# Patient Record
Sex: Female | Born: 1975 | Race: Black or African American | Hispanic: Yes | Marital: Married | Smoking: Current every day smoker
Health system: Southern US, Community
[De-identification: ages and names within clinical notes are randomized; demographics above are authoritative.]

## PROBLEM LIST (undated history)

## (undated) DIAGNOSIS — Z973 Presence of spectacles and contact lenses: Secondary | ICD-10-CM

## (undated) DIAGNOSIS — M1990 Unspecified osteoarthritis, unspecified site: Secondary | ICD-10-CM

## (undated) DIAGNOSIS — F4310 Post-traumatic stress disorder, unspecified: Secondary | ICD-10-CM

## (undated) DIAGNOSIS — F419 Anxiety disorder, unspecified: Secondary | ICD-10-CM

## (undated) DIAGNOSIS — E669 Obesity, unspecified: Secondary | ICD-10-CM

## (undated) DIAGNOSIS — J45909 Unspecified asthma, uncomplicated: Secondary | ICD-10-CM

## (undated) DIAGNOSIS — F32A Depression, unspecified: Secondary | ICD-10-CM

## (undated) DIAGNOSIS — K219 Gastro-esophageal reflux disease without esophagitis: Secondary | ICD-10-CM

## (undated) DIAGNOSIS — R0600 Dyspnea, unspecified: Secondary | ICD-10-CM

## (undated) DIAGNOSIS — F329 Major depressive disorder, single episode, unspecified: Secondary | ICD-10-CM

## (undated) DIAGNOSIS — I10 Essential (primary) hypertension: Secondary | ICD-10-CM

## (undated) HISTORY — DX: Unspecified osteoarthritis, unspecified site: M19.90

## (undated) HISTORY — PX: LEEP: SHX91

---

## 2013-05-03 DIAGNOSIS — F329 Major depressive disorder, single episode, unspecified: Secondary | ICD-10-CM | POA: Insufficient documentation

## 2013-05-03 DIAGNOSIS — E669 Obesity, unspecified: Secondary | ICD-10-CM | POA: Insufficient documentation

## 2013-05-03 DIAGNOSIS — F419 Anxiety disorder, unspecified: Secondary | ICD-10-CM | POA: Insufficient documentation

## 2013-05-03 DIAGNOSIS — F32A Depression, unspecified: Secondary | ICD-10-CM | POA: Insufficient documentation

## 2013-05-03 DIAGNOSIS — I10 Essential (primary) hypertension: Secondary | ICD-10-CM | POA: Insufficient documentation

## 2018-01-09 ENCOUNTER — Ambulatory Visit (HOSPITAL_COMMUNITY)
Admission: RE | Admit: 2018-01-09 | Discharge: 2018-01-09 | Disposition: A | Discharge status: Home / Self Care | Payer: Medicaid Other | Attending: Psychiatry | Admitting: Psychiatry

## 2018-01-09 ENCOUNTER — Ambulatory Visit (HOSPITAL_COMMUNITY): Admission: RE | Admit: 2018-01-09 | Payer: Medicaid Other | Source: Home / Self Care | Admitting: Psychiatry

## 2018-01-09 DIAGNOSIS — F411 Generalized anxiety disorder: Secondary | ICD-10-CM | POA: Diagnosis not present

## 2018-01-09 DIAGNOSIS — Z79899 Other long term (current) drug therapy: Secondary | ICD-10-CM | POA: Diagnosis not present

## 2018-01-09 DIAGNOSIS — F332 Major depressive disorder, recurrent severe without psychotic features: Secondary | ICD-10-CM | POA: Diagnosis not present

## 2018-01-09 DIAGNOSIS — F4310 Post-traumatic stress disorder, unspecified: Secondary | ICD-10-CM | POA: Diagnosis not present

## 2018-01-09 DIAGNOSIS — F1020 Alcohol dependence, uncomplicated: Secondary | ICD-10-CM | POA: Diagnosis not present

## 2018-01-09 DIAGNOSIS — F1220 Cannabis dependence, uncomplicated: Secondary | ICD-10-CM | POA: Diagnosis not present

## 2018-01-09 NOTE — H&P (Signed)
Behavioral Health Medical Screening Exam  Tracy Coleman is an 42 y.o. female, presents with her husband as a walk-in to the [facility] facility for psych evaluation. She endorses acute physical periods of flushing and nausea when she is upset, but denies CP, SOB, Vertigo or pain. She is endorsing exacerbated depressive symptoms, since being off of her previously prescribed Citalopram since December. She has a hx of MDD, GAD and PTSD. She is also prescribed and currently taking Wellbutrin XL 300 mg daily, Prazosin 1 mg qhs, and Trazodone 200 mg daily. She denies feelings of self harm, SI/SA or HI. Identified triggers include family discord, daughter transitioning out of the home. Depressive symptoms include sadness, crying, feeling overwhelmed and overburdened. She also added that she occassionally consumed increased quantities of alcohol but denies illicit drug use. Total Time spent with patient: 30 minutes  Psychiatric Specialty Exam: Physical Exam  Constitutional: She is oriented to person, place, and time. She appears well-developed and well-nourished. No distress.  HENT:  Head: Normocephalic.  Eyes: Pupils are equal, round, and reactive to light.  Respiratory: Effort normal and breath sounds normal. No respiratory distress.  GI: Soft.  Neurological: She is alert and oriented to person, place, and time. No cranial nerve deficit.  Skin: Skin is warm and dry. She is not diaphoretic.  Psychiatric: Her speech is normal. Judgment and thought content normal. She is withdrawn. Cognition and memory are normal. She exhibits a depressed mood.    Review of Systems  Psychiatric/Behavioral: Positive for depression and substance abuse. Negative for hallucinations and suicidal ideas. The patient is not nervous/anxious and does not have insomnia.   All other systems reviewed and are negative.   There were no vitals taken for this visit.There is no height or weight on file to calculate BMI.  General Appearance:  Casual  Eye Contact:  Good  Speech:  Clear and Coherent  Volume:  Normal  Mood:  Depressed  Affect:  Congruent  Thought Process:  Goal Directed  Orientation:  Full (Time, Place, and Person)  Thought Content:  Logical  Suicidal Thoughts:  No  Homicidal Thoughts:  No  Memory:  Immediate;   Good  Judgement:  Good  Insight:  Fair  Psychomotor Activity:  Normal  Concentration: Concentration: Good  Recall:  Good  Fund of Knowledge:Good  Language: Good  Akathisia:  Negative  Handed:  Right  AIMS (if indicated):     Assets:  Desire for Improvement  Sleep:       Musculoskeletal: Strength & Muscle Tone: within normal limits Gait & Station: normal Patient leans: N/A  There were no vitals taken for this visit.  Recommendations:  Based on my evaluation the patient does not appear to have an emergency medical condition.  Laverle Hobby, PA-C 01/09/2018, 9:50 PM
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
## 2018-03-06 ENCOUNTER — Ambulatory Visit (INDEPENDENT_AMBULATORY_CARE_PROVIDER_SITE_OTHER): Payer: Medicaid Other | Admitting: Family Medicine

## 2018-03-06 VITALS — BP 117/82 | Ht 65.0 in | Wt 253.0 lb

## 2018-03-06 DIAGNOSIS — M19079 Primary osteoarthritis, unspecified ankle and foot: Secondary | ICD-10-CM | POA: Insufficient documentation

## 2018-03-06 NOTE — Patient Instructions (Signed)
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 We will refer you to Dr. Doran Durand to discuss surgery to remove this large spur. We discussed inserts with a first ray post to unload the area. Consider bunion pad/shield to take pressure off the area. Injection is a consideration but with the amount of arthritis seen, I don't think this would be very helpful and there isn't much space for the needle to rest in the joint for the injection.

## 2018-03-06 NOTE — Progress Notes (Signed)
HPI  Tracy Coleman is a 42 y/o woman here for evaluation of pain at her right 1st MTP joint also with pain in the anterolateral distal leg. She has been noticing hypertrophy and pain over the 1st MTP for about 6 years but this has gotten worse recently, especially after wearing tight fitting heels rather than sneakers. She usually wears only sneakers because any rigid toebox tends to rub and hurt her toe. She takes no medications and has never suffered similar pain in any other joint. She has tried sole inserts in the past but tend to make her shoe feel much too tight and then hurt. She has also been noticing new shooting pain up the side of her distal leg that she worries is nerve pain. This burns and often worsens while driving or sometimes in cold or hot temperatures. Pain worse with walking, compression.  No skin changes, numbness.  CC: Right great toe pain, right distal leg pain  PMH: HTN, obesity, anxiety  No family history on file.   Social History   Socioeconomic History  . Marital status: Married    Spouse name: Not on file  . Number of children: Not on file  . Years of education: Not on file  . Highest education level: Not on file  Tobacco Use  . Smoking status: Not on file  Substance and Sexual Activity  . Alcohol use: Not on file  . Drug use: Not on file  . Sexual activity: Not on file    Medications/Interventions Tried: Sole inserts  See HPI and/or previous note for associated ROS.  Objective: BP 117/82   Ht 5\' 5"  (1.651 m)   Wt 253 lb (114.8 kg)   BMI 42.10 kg/m  Gen: Right-Hand Dominant. NAD, well groomed, normal affect.  CV: Well-perfused. Warm. DP pulses are 2+ bilaterally Resp: Non-labored.  Neuro: Sensation intact throughout. Achilles and plantar reflexes are symmetric Gait: Nonpathologic posture MSK: Her ankles appear symmetric and without erythema and with normal ROM There is a hard nodularity over the head of the Right 1st metatarsal with overlying  redness and very slight warmth, 1st MTP dorsiflexion ROM is negligible, plantarflexion is relatively preserved but with pain, there is slight lateral deviation of the great toe, the 1st and 2nd nail appear onychomycotic  Ultrasound evaluation of the right 1st MTP joint shows very extensive dorsal bone spurring and small effusion in the soft tissue over the joint. Comparison on the left 1st MTP shows some very early bone spurring.  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 This is pretty advanced for a 42 year old woman, although her obesity is a risk for earlier osteoarthritis. Her lateral leg pain seems most likely to be overuse injury from avoiding dorsal deflection of the joint to avoid pain during walking. She is not interested in a trial of conservative measures such as orthotics, and was more interested in cheilectomy as a treatment. She discussed pursuing bariatric surgery in the future and also wants to increase her mobility for exercise associated with this. A referral is placed today for orthopedic surgery with Dr. Doran Durand to evaluate as a candidate for cheilectomy. She is not using NSAIDs and probably shoulder continue to avoid while on high dose lisinopril.   Orders Placed This Encounter  Procedures  . Ambulatory referral to Orthopedic Surgery    Referral Priority:   Routine    Referral Type:   Surgical  Referral Reason:   Specialty Services Required    Referred to Provider:   Wylene Simmer, MD    Requested Specialty:   Orthopedic Surgery    Number of Visits Requested:   [place], MD [facility] Resident 03/06/2018, 4:09 PM  Patient seen and examined with Resident, agree with his above note.  Patient with severe hallux rigidus with very large dorsal spur at 1st MTP of right foot, confirmed with ultrasound.  We discussed bunion pad/shield over spur, injection (though  advised would be very difficult technically even with ultrasound due to lack of space), arch supports with first ray posts.  She is interested in surgical intervention and reports she has tried many of these measures.  Right foot: Hallux rigidus.  Very small bunion.  No other deformity.  No bruising.  Very mild redness dorsally at 1st MTP.  No warmth. FROM ankle with 5/5 strength. TTP 1st MTP dorsally. Negative ant drawer and talar tilt.   Negative syndesmotic compression. Negative metatarsal squeeze. Thompsons test negative. NV intact distally.  Left foot: No deformity. FROM with 5/5 strength. No tenderness to palpation. NVI distally.

## 2018-03-06 NOTE — Assessment & Plan Note (Addendum)
There is arthritis with very extensive dorsal bone spurring at the Right 1st MTP. This is pretty advanced for a 42 year old woman, although her obesity is a risk for earlier osteoarthritis. Her lateral leg pain seems most likely to be overuse injury from avoiding dorsal deflection of the joint to avoid pain during walking. She is not interested in a trial of conservative measures such as orthotics, and was more interested in cheilectomy as a treatment. She discussed pursuing bariatric surgery in the future and also wants to increase her mobility for exercise associated with this. A referral is placed today for orthopedic surgery with Dr. Doran Durand to evaluate as a candidate for cheilectomy. She is not using NSAIDs and probably shoulder continue to avoid while on high dose lisinopril.

## 2018-03-07 ENCOUNTER — Encounter: Payer: Self-pay | Admitting: Family Medicine

## 2018-03-28 ENCOUNTER — Other Ambulatory Visit: Payer: Self-pay

## 2018-03-28 ENCOUNTER — Encounter (HOSPITAL_BASED_OUTPATIENT_CLINIC_OR_DEPARTMENT_OTHER): Payer: Self-pay | Admitting: *Deleted

## 2018-04-01 ENCOUNTER — Other Ambulatory Visit: Payer: Self-pay | Admitting: Orthopedic Surgery

## 2018-04-08 ENCOUNTER — Encounter (HOSPITAL_BASED_OUTPATIENT_CLINIC_OR_DEPARTMENT_OTHER)
Admission: RE | Admit: 2018-04-08 | Discharge: 2018-04-08 | Disposition: A | Discharge status: Home / Self Care | Payer: Medicaid Other | Source: Ambulatory Visit | Attending: Orthopedic Surgery | Admitting: Orthopedic Surgery

## 2018-04-08 DIAGNOSIS — Z87891 Personal history of nicotine dependence: Secondary | ICD-10-CM | POA: Diagnosis not present

## 2018-04-08 DIAGNOSIS — M79671 Pain in right foot: Secondary | ICD-10-CM | POA: Diagnosis present

## 2018-04-08 DIAGNOSIS — M2021 Hallux rigidus, right foot: Secondary | ICD-10-CM | POA: Diagnosis not present

## 2018-04-08 DIAGNOSIS — Z79899 Other long term (current) drug therapy: Secondary | ICD-10-CM | POA: Diagnosis not present

## 2018-04-08 DIAGNOSIS — F329 Major depressive disorder, single episode, unspecified: Secondary | ICD-10-CM | POA: Diagnosis not present

## 2018-04-08 DIAGNOSIS — I10 Essential (primary) hypertension: Secondary | ICD-10-CM | POA: Diagnosis not present

## 2018-04-08 LAB — BASIC METABOLIC PANEL
ANION GAP: 9 (ref 5–15)
BUN: 8 mg/dL (ref 6–20)
CHLORIDE: 99 mmol/L — AB (ref 101–111)
CO2: 27 mmol/L (ref 22–32)
CREATININE: 0.83 mg/dL (ref 0.44–1.00)
Calcium: 9 mg/dL (ref 8.9–10.3)
GFR calc Af Amer: >60 mL/min (ref >60)
GFR calc non Af Amer: >60 mL/min (ref >60)
Glucose, Bld: 87 mg/dL (ref 65–99)
Potassium: 2.9 mmol/L — ABNORMAL LOW (ref 3.5–5.1)
Sodium: 135 mmol/L (ref 135–145)

## 2018-04-09 NOTE — Progress Notes (Signed)
Lab results reviewed by Dr. Eligha Bridegroom, K+-2.9, will proceed with surgery as scheduled.

## 2018-04-10 ENCOUNTER — Encounter (HOSPITAL_BASED_OUTPATIENT_CLINIC_OR_DEPARTMENT_OTHER): Payer: Self-pay | Admitting: Anesthesiology

## 2018-04-10 NOTE — Anesthesia Preprocedure Evaluation (Addendum)
Anesthesia Evaluation  Patient identified by MRN, date of birth, ID band Patient awake    Reviewed: Allergy & Precautions, NPO status , Patient's Chart, lab work & pertinent test results  Airway Mallampati: II  TM Distance: >3 FB Neck ROM: Full    Dental  (+) Dental Advisory Given   Pulmonary asthma , former smoker,    Pulmonary exam normal        Cardiovascular hypertension, Pt. on medications Normal cardiovascular exam     Neuro/Psych PSYCHIATRIC DISORDERS Anxiety Depression    GI/Hepatic negative GI ROS, Neg liver ROS,   Endo/Other  negative endocrine ROS  Renal/GU negative Renal ROS     Musculoskeletal  (+) Arthritis ,   Abdominal   Peds  Hematology   Anesthesia Other Findings   Reproductive/Obstetrics                            EKG: normal sinus rhythm.  Anesthesia Physical Anesthesia Plan  ASA: III  Anesthesia Plan: General   Post-op Pain Management:    Induction: Intravenous  PONV Risk Score and Plan: 4 or greater and Ondansetron, Dexamethasone, Midazolam and Scopolamine patch - Pre-op  Airway Management Planned: LMA  Additional Equipment: None  Intra-op Plan:   Post-operative Plan: Extubation in OR  Informed Consent: I have reviewed the patients History and Physical, chart, labs and discussed the procedure including the risks, benefits and alternatives for the proposed anesthesia with the patient or authorized representative who has indicated his/her understanding and acceptance.   Dental advisory given  Plan Discussed with: CRNA and Anesthesiologist  Anesthesia Plan Comments:        Anesthesia Quick Evaluation

## 2018-04-11 ENCOUNTER — Other Ambulatory Visit: Payer: Self-pay

## 2018-04-11 ENCOUNTER — Encounter (HOSPITAL_BASED_OUTPATIENT_CLINIC_OR_DEPARTMENT_OTHER)
Admission: RE | Disposition: A | Discharge status: Home / Self Care | Payer: Self-pay | Source: Ambulatory Visit | Attending: Orthopedic Surgery

## 2018-04-11 ENCOUNTER — Ambulatory Visit (HOSPITAL_BASED_OUTPATIENT_CLINIC_OR_DEPARTMENT_OTHER): Payer: Medicaid Other | Admitting: Anesthesiology

## 2018-04-11 ENCOUNTER — Encounter (HOSPITAL_BASED_OUTPATIENT_CLINIC_OR_DEPARTMENT_OTHER): Payer: Self-pay | Admitting: *Deleted

## 2018-04-11 ENCOUNTER — Ambulatory Visit (HOSPITAL_BASED_OUTPATIENT_CLINIC_OR_DEPARTMENT_OTHER)
Admission: RE | Admit: 2018-04-11 | Discharge: 2018-04-11 | Disposition: A | Discharge status: Home / Self Care | Payer: Medicaid Other | Source: Ambulatory Visit | Attending: Orthopedic Surgery | Admitting: Orthopedic Surgery

## 2018-04-11 DIAGNOSIS — M2021 Hallux rigidus, right foot: Secondary | ICD-10-CM | POA: Insufficient documentation

## 2018-04-11 DIAGNOSIS — F329 Major depressive disorder, single episode, unspecified: Secondary | ICD-10-CM | POA: Insufficient documentation

## 2018-04-11 DIAGNOSIS — Z79899 Other long term (current) drug therapy: Secondary | ICD-10-CM | POA: Insufficient documentation

## 2018-04-11 DIAGNOSIS — M19079 Primary osteoarthritis, unspecified ankle and foot: Secondary | ICD-10-CM

## 2018-04-11 DIAGNOSIS — Z87891 Personal history of nicotine dependence: Secondary | ICD-10-CM | POA: Diagnosis not present

## 2018-04-11 DIAGNOSIS — I10 Essential (primary) hypertension: Secondary | ICD-10-CM | POA: Insufficient documentation

## 2018-04-11 HISTORY — DX: Essential (primary) hypertension: I10

## 2018-04-11 HISTORY — DX: Unspecified asthma, uncomplicated: J45.909

## 2018-04-11 HISTORY — DX: Post-traumatic stress disorder, unspecified: F43.10

## 2018-04-11 HISTORY — DX: Major depressive disorder, single episode, unspecified: F32.9

## 2018-04-11 HISTORY — PX: ARTHRODESIS METATARSALPHALANGEAL JOINT (MTPJ): SHX6566

## 2018-04-11 HISTORY — DX: Anxiety disorder, unspecified: F41.9

## 2018-04-11 HISTORY — DX: Obesity, unspecified: E66.9

## 2018-04-11 HISTORY — DX: Depression, unspecified: F32.A

## 2018-04-11 SURGERY — FUSION, JOINT, GREAT TOE
Anesthesia: General | Site: Foot | Laterality: Right

## 2018-04-11 MED ORDER — DOCUSATE SODIUM 100 MG PO CAPS: 100.0000 mg | ORAL_CAPSULE | Freq: Two times a day (BID) | ORAL | 0 refills | Status: DC

## 2018-04-11 MED ORDER — MIDAZOLAM HCL 2 MG/2ML IJ SOLN
INTRAMUSCULAR | Status: AC
  Filled 2018-04-11: qty 2

## 2018-04-11 MED ORDER — LACTATED RINGERS IV SOLN
INTRAVENOUS | Status: DC
  Administered 2018-04-11 (×3): via INTRAVENOUS

## 2018-04-11 MED ORDER — CEFAZOLIN SODIUM-DEXTROSE 2-4 GM/100ML-% IV SOLN
INTRAVENOUS | Status: AC
  Filled 2018-04-11: qty 100

## 2018-04-11 MED ORDER — MIDAZOLAM HCL 2 MG/2ML IJ SOLN
1.0000 mg | INTRAMUSCULAR | Status: DC | PRN
  Administered 2018-04-11 (×2): 2 mg via INTRAVENOUS

## 2018-04-11 MED ORDER — BUPIVACAINE-EPINEPHRINE (PF) 0.5% -1:200000 IJ SOLN
INTRAMUSCULAR | Status: DC | PRN
  Administered 2018-04-11: 30 mL via PERINEURAL

## 2018-04-11 MED ORDER — FENTANYL CITRATE (PF) 100 MCG/2ML IJ SOLN
50.0000 ug | INTRAMUSCULAR | Status: DC | PRN
  Administered 2018-04-11: 100 ug via INTRAVENOUS

## 2018-04-11 MED ORDER — CEFAZOLIN SODIUM-DEXTROSE 2-4 GM/100ML-% IV SOLN
2.0000 g | INTRAVENOUS | Status: AC
  Administered 2018-04-11: 2 g via INTRAVENOUS

## 2018-04-11 MED ORDER — ONDANSETRON HCL 4 MG/2ML IJ SOLN
INTRAMUSCULAR | Status: DC | PRN
  Administered 2018-04-11: 4 mg via INTRAVENOUS

## 2018-04-11 MED ORDER — SODIUM CHLORIDE 0.9 % IV SOLN: INTRAVENOUS | Status: DC

## 2018-04-11 MED ORDER — SCOPOLAMINE 1 MG/3DAYS TD PT72: 1.0000 | MEDICATED_PATCH | Freq: Once | TRANSDERMAL | Status: DC | PRN

## 2018-04-11 MED ORDER — CHLORHEXIDINE GLUCONATE 4 % EX LIQD: 60.0000 mL | Freq: Once | CUTANEOUS | Status: DC

## 2018-04-11 MED ORDER — BUPIVACAINE-EPINEPHRINE 0.5% -1:200000 IJ SOLN
INTRAMUSCULAR | Status: DC | PRN
  Administered 2018-04-11: 10 mL

## 2018-04-11 MED ORDER — OXYCODONE HCL 5 MG PO TABS
ORAL_TABLET | ORAL | Status: AC
  Filled 2018-04-11: qty 1

## 2018-04-11 MED ORDER — HYDROMORPHONE HCL 1 MG/ML IJ SOLN: 0.2500 mg | INTRAMUSCULAR | Status: DC | PRN

## 2018-04-11 MED ORDER — OXYCODONE HCL 5 MG PO TABS
5.0000 mg | ORAL_TABLET | Freq: Once | ORAL | Status: AC
  Administered 2018-04-11: 5 mg via ORAL

## 2018-04-11 MED ORDER — SENNA 8.6 MG PO TABS: 2.0000 | ORAL_TABLET | Freq: Two times a day (BID) | ORAL | 0 refills | Status: DC

## 2018-04-11 MED ORDER — DEXAMETHASONE SODIUM PHOSPHATE 10 MG/ML IJ SOLN
INTRAMUSCULAR | Status: DC | PRN
  Administered 2018-04-11: 10 mg via INTRAVENOUS

## 2018-04-11 MED ORDER — OXYCODONE HCL 5 MG PO TABS: 5.0000 mg | ORAL_TABLET | ORAL | 0 refills | Status: DC | PRN

## 2018-04-11 MED ORDER — FENTANYL CITRATE (PF) 100 MCG/2ML IJ SOLN
INTRAMUSCULAR | Status: AC
  Filled 2018-04-11: qty 2

## 2018-04-11 MED ORDER — BUPIVACAINE-EPINEPHRINE (PF) 0.5% -1:200000 IJ SOLN
INTRAMUSCULAR | Status: AC
  Filled 2018-04-11: qty 30

## 2018-04-11 MED ORDER — LIDOCAINE HCL (CARDIAC) PF 100 MG/5ML IV SOSY
PREFILLED_SYRINGE | INTRAVENOUS | Status: DC | PRN
  Administered 2018-04-11: 50 mg via INTRAVENOUS

## 2018-04-11 MED ORDER — PROMETHAZINE HCL 25 MG/ML IJ SOLN: 6.2500 mg | INTRAMUSCULAR | Status: DC | PRN

## 2018-04-11 MED ORDER — PROPOFOL 10 MG/ML IV BOLUS
INTRAVENOUS | Status: DC | PRN
  Administered 2018-04-11: 200 mg via INTRAVENOUS

## 2018-04-11 MED ORDER — CLONIDINE HCL (ANALGESIA) 100 MCG/ML EP SOLN
EPIDURAL | Status: DC | PRN
  Administered 2018-04-11: 50 ug

## 2018-04-11 SURGICAL SUPPLY — 74 items
BANDAGE ESMARK 6X9 LF (GAUZE/BANDAGES/DRESSINGS) IMPLANT
BIT DRILL 2.0 (BIT) ×1
BIT DRILL 2.9 CANN QC NONSTRL (BIT) ×4 IMPLANT
BIT DRILL 2XNS DISP SS SM FRAG (BIT) ×1 IMPLANT
BIT DRL 2XNS DISP SS SM FRAG (BIT) ×1
BLADE AVERAGE 25X9 (BLADE) IMPLANT
BLADE MICRO SAGITTAL (BLADE) IMPLANT
BLADE OSC/SAG .038X5.5 CUT EDG (BLADE) IMPLANT
BLADE SURG 15 STRL LF DISP TIS (BLADE) ×2 IMPLANT
BLADE SURG 15 STRL SS (BLADE) ×2
BNDG COHESIVE 4X5 TAN STRL (GAUZE/BANDAGES/DRESSINGS) ×2 IMPLANT
BNDG COHESIVE 6X5 TAN STRL LF (GAUZE/BANDAGES/DRESSINGS) ×2 IMPLANT
BNDG CONFORM 3 STRL LF (GAUZE/BANDAGES/DRESSINGS) ×2 IMPLANT
BNDG ESMARK 6X9 LF (GAUZE/BANDAGES/DRESSINGS)
CHLORAPREP W/TINT 26ML (MISCELLANEOUS) ×2 IMPLANT
COVER BACK TABLE 60X90IN (DRAPES) ×2 IMPLANT
CUFF TOURNIQUET SINGLE 34IN LL (TOURNIQUET CUFF) ×2 IMPLANT
DRAPE EXTREMITY T 121X128X90 (DRAPE) ×2 IMPLANT
DRAPE OEC MINIVIEW 54X84 (DRAPES) ×2 IMPLANT
DRAPE U-SHAPE 47X51 STRL (DRAPES) ×2 IMPLANT
DRSG MEPITEL 4X7.2 (GAUZE/BANDAGES/DRESSINGS) ×2 IMPLANT
DRSG PAD ABDOMINAL 8X10 ST (GAUZE/BANDAGES/DRESSINGS) ×4 IMPLANT
ELECT REM PT RETURN 9FT ADLT (ELECTROSURGICAL) ×2
ELECTRODE REM PT RTRN 9FT ADLT (ELECTROSURGICAL) ×1 IMPLANT
GAUZE SPONGE 4X4 12PLY STRL (GAUZE/BANDAGES/DRESSINGS) ×2 IMPLANT
GLOVE BIO SURGEON STRL SZ8 (GLOVE) ×2 IMPLANT
GLOVE BIOGEL PI IND STRL 7.0 (GLOVE) ×1 IMPLANT
GLOVE BIOGEL PI IND STRL 8 (GLOVE) ×2 IMPLANT
GLOVE BIOGEL PI INDICATOR 7.0 (GLOVE) ×1
GLOVE BIOGEL PI INDICATOR 8 (GLOVE) ×2
GLOVE ECLIPSE 6.5 STRL STRAW (GLOVE) ×2 IMPLANT
GLOVE ECLIPSE 8.0 STRL XLNG CF (GLOVE) ×2 IMPLANT
GOWN STRL REUS W/ TWL LRG LVL3 (GOWN DISPOSABLE) ×1 IMPLANT
GOWN STRL REUS W/ TWL XL LVL3 (GOWN DISPOSABLE) ×2 IMPLANT
GOWN STRL REUS W/TWL LRG LVL3 (GOWN DISPOSABLE) ×1
GOWN STRL REUS W/TWL XL LVL3 (GOWN DISPOSABLE) ×2
K-WIRE ACE 1.6X6 (WIRE) ×2
KWIRE ACE 1.6X6 (WIRE) ×1 IMPLANT
NEEDLE HYPO 22GX1.5 SAFETY (NEEDLE) ×2 IMPLANT
PACK BASIN DAY SURGERY FS (CUSTOM PROCEDURE TRAY) ×2 IMPLANT
PAD CAST 4YDX4 CTTN HI CHSV (CAST SUPPLIES) ×1 IMPLANT
PADDING CAST ABS 4INX4YD NS (CAST SUPPLIES)
PADDING CAST ABS COTTON 4X4 ST (CAST SUPPLIES) IMPLANT
PADDING CAST COTTON 4X4 STRL (CAST SUPPLIES) ×1
PADDING CAST COTTON 6X4 STRL (CAST SUPPLIES) ×2 IMPLANT
PENCIL BUTTON HOLSTER BLD 10FT (ELECTRODE) ×2 IMPLANT
PLATE SM 1/4 TUBULAR 4H (Plate) ×2 IMPLANT
SANITIZER HAND PURELL 535ML FO (MISCELLANEOUS) ×2 IMPLANT
SCREW CORTICAL 2.7MM  18MM (Screw) ×2 IMPLANT
SCREW CORTICAL 2.7MM  20MM (Screw) ×2 IMPLANT
SCREW CORTICAL 2.7MM 18MM (Screw) ×2 IMPLANT
SCREW CORTICAL 2.7MM 20MM (Screw) ×2 IMPLANT
SCREW LAG  RD HEAD 4.0 32 LTH (Screw) ×2 IMPLANT
SCREW LAG RD HEAD 4.0 32 LTH (Screw) ×2 IMPLANT
SHEET MEDIUM DRAPE 40X70 STRL (DRAPES) ×2 IMPLANT
SLEEVE SCD COMPRESS KNEE MED (MISCELLANEOUS) ×2 IMPLANT
SPLINT FAST PLASTER 5X30 (CAST SUPPLIES)
SPLINT PLASTER CAST FAST 5X30 (CAST SUPPLIES) IMPLANT
SPONGE LAP 18X18 RF (DISPOSABLE) ×2 IMPLANT
SPONGE SURGIFOAM ABS GEL 12-7 (HEMOSTASIS) IMPLANT
STOCKINETTE 6  STRL (DRAPES) ×1
STOCKINETTE 6 STRL (DRAPES) ×1 IMPLANT
SUCTION FRAZIER HANDLE 10FR (MISCELLANEOUS) ×1
SUCTION TUBE FRAZIER 10FR DISP (MISCELLANEOUS) ×1 IMPLANT
SUT ETHILON 3 0 PS 1 (SUTURE) ×2 IMPLANT
SUT MNCRL AB 3-0 PS2 18 (SUTURE) ×2 IMPLANT
SUT VIC AB 0 SH 27 (SUTURE) IMPLANT
SUT VIC AB 2-0 SH 27 (SUTURE) ×1
SUT VIC AB 2-0 SH 27XBRD (SUTURE) ×1 IMPLANT
SYR BULB 3OZ (MISCELLANEOUS) ×2 IMPLANT
SYR CONTROL 10ML LL (SYRINGE) ×2 IMPLANT
TOWEL OR 17X24 6PK STRL BLUE (TOWEL DISPOSABLE) ×4 IMPLANT
TUBE CONNECTING 20X1/4 (TUBING) ×2 IMPLANT
UNDERPAD 30X30 (UNDERPADS AND DIAPERS) ×2 IMPLANT

## 2018-04-11 NOTE — Anesthesia Procedure Notes (Signed)
Procedure Name: LMA Insertion Date/Time: 04/11/2018 8:50 AM Performed by: Signe Colt, CRNA Pre-anesthesia Checklist: Patient identified, Emergency Drugs available, Suction available and Patient being monitored Patient Re-evaluated:Patient Re-evaluated prior to induction Oxygen Delivery Method: Circle system utilized Preoxygenation: Pre-oxygenation with 100% oxygen Induction Type: IV induction Ventilation: Mask ventilation without difficulty LMA: LMA inserted LMA Size: 4.0 Number of attempts: 1 Airway Equipment and Method: Bite block Placement Confirmation: positive ETCO2 Tube secured with: Tape Dental Injury: Teeth and Oropharynx as per pre-operative assessment

## 2018-04-11 NOTE — Progress Notes (Signed)
Assisted Dr. Singer with right, ultrasound guided, popliteal block. Side rails up, monitors on throughout procedure. See vital signs in flow sheet. Tolerated Procedure well.  

## 2018-04-11 NOTE — H&P (Signed)
Tracy Coleman is an 42 y.o. female.   Chief Complaint: right foot pain HPI: The patient is a 42 year old female with a long history of right forefoot pain due to hallux rigidus.  She has failed nonoperative treatment to date including activity modification, oral anti-inflammatories and shoe wear modification.  She presents now for surgical treatment.  Past Medical History:  Diagnosis Date  . Anxiety   . Asthma   . Depression   . Hypertension   . Obesity   . Post traumatic stress disorder (PTSD)   . PTSD (post-traumatic stress disorder)   . PTSD (post-traumatic stress disorder)     History reviewed. No pertinent surgical history.  History reviewed. No pertinent family history. Social History:  reports that she has quit smoking. She has never used smokeless tobacco. She reports that she drinks alcohol. She reports that she has current or past drug history. Drug: Marijuana.  Allergies:  Allergies  Allergen Reactions  . Varenicline Other (See Comments)    May have contributed to suicidal ideation    Medications Prior to Admission  Medication Sig Dispense Refill  . amLODipine (NORVASC) 10 MG tablet Take 5 mg by mouth daily.     Marland Kitchen buPROPion (WELLBUTRIN XL) 300 MG 24 hr tablet Take 300 mg by mouth daily.    . chlorthalidone (HYGROTON) 25 MG tablet Take 25 mg by mouth daily.    . citalopram (CELEXA) 40 MG tablet Take 40 mg by mouth daily.    Marland Kitchen lisinopril (PRINIVIL,ZESTRIL) 40 MG tablet Take 40 mg by mouth daily.    . traZODone (DESYREL) 100 MG tablet Take 200 mg by mouth.   3  . levonorgestrel (MIRENA) 20 MCG/24HR IUD by Intrauterine route.    . Multiple Vitamin (THERA) TABS Take by mouth.    . prazosin (MINIPRESS) 1 MG capsule Take 1 mg by mouth at bedtime.      No results found for this or any previous visit (from the past 48 hour(s)). No results found.  ROS no recent fever, chills, nausea, vomiting or changes in her appetite  Blood pressure 116/83, pulse 70, temperature 97.8  F (36.6 C), temperature source Oral, resp. rate (!) 21, height 5' 4.75" (1.645 m), weight 116.1 kg (256 lb), last menstrual period 04/10/2018, SpO2 100 %. Physical Exam  Well-nourished well-developed woman in no apparent distress.  Alert and oriented x4.  Mood and affect are normal.  Extraocular motions are intact.  Respirations are unlabored.  Gait is antalgic to the right.  Decreased range of motion of the hallux MP joint.  Tender to palpation at the hallux MP joint.  Skin is healthy and intact.  Pulses are palpable.  No lymphadenopathy.  Sensibility to light touch is intact at the forefoot dorsally and plantarly.  Assessment/Plan Right hallux rigidus -to the operating room for hallux MP joint arthrodesis.  The risks and benefits of the alternative treatment options have been discussed in detail.  The patient wishes to proceed with surgery and specifically understands risks of bleeding, infection, nerve damage, blood clots, need for additional surgery, amputation and death.   Wylene Simmer, MD 04/24/2018, 8:37 AM

## 2018-04-11 NOTE — Transfer of Care (Signed)
Immediate Anesthesia Transfer of Care Note  Patient: Tracy Coleman  Procedure(s) Performed: ARTHRODESIS METATARSALPHALANGEAL JOINT (MTPJ) (Right Foot)  Patient Location: PACU  Anesthesia Type:GA combined with regional for post-op pain  Level of Consciousness: awake and patient cooperative  Airway & Oxygen Therapy: Patient Spontanous Breathing and Patient connected to face mask oxygen  Post-op Assessment: Report given to RN and Post -op Vital signs reviewed and stable  Post vital signs: Reviewed and stable  Last Vitals:  Vitals Value Taken Time  BP    Temp    Pulse 65 04/11/2018  9:48 AM  Resp 17 04/11/2018  9:48 AM  SpO2 100 % 04/11/2018  9:48 AM  Vitals shown include unvalidated device data.  Last Pain:  Vitals:   04/11/18 0715  TempSrc: Oral  PainSc: 0-No pain         Complications: No apparent anesthesia complications

## 2018-04-11 NOTE — Anesthesia Postprocedure Evaluation (Signed)
Anesthesia Post Note  Patient: Tracy Coleman  Procedure(s) Performed: ARTHRODESIS METATARSALPHALANGEAL JOINT (MTPJ) (Right Foot)     Patient location during evaluation: PACU Anesthesia Type: General Level of consciousness: sedated Pain management: pain level controlled Vital Signs Assessment: post-procedure vital signs reviewed and stable Respiratory status: spontaneous breathing and respiratory function stable Cardiovascular status: stable Postop Assessment: no apparent nausea or vomiting Anesthetic complications: no    Last Vitals:  Vitals:   04/11/18 1015 04/11/18 1042  BP: 120/77 136/85  Pulse: 73 79  Resp: 18 18  Temp:  36.9 C  SpO2: 100% 100%    Last Pain:  Vitals:   04/11/18 1042  TempSrc:   PainSc: 6                  Brodrick Curran DANIEL

## 2018-04-11 NOTE — Discharge Instructions (Addendum)
Tracy Simmer, MD [place]  Please read the following information regarding your care after surgery.  Medications  You only need a prescription for the narcotic pain medicine (ex. oxycodone, Percocet, Norco).  All of the other medicines listed below are available over the counter. X Aleve 2 pills twice a day for the first 3 days after surgery. X acetominophen (Tylenol) 650 mg every 4-6 hours as you need for minor to moderate pain X oxycodone as prescribed for severe pain  Narcotic pain medicine (ex. oxycodone, Percocet, Vicodin) will cause constipation.  To prevent this problem, take the following medicines while you are taking any pain medicine. X docusate sodium (Colace) 100 mg twice a day X senna (Senokot) 2 tablets twice a day  Weight Bearing X Bear weight only on your operated foot, on your heel, in the CAM boot.   Cast / Splint / Dressing X Keep your splint, cast or dressing clean and dry.  Dont put anything (coat hanger, pencil, etc) down inside of it.  If it gets damp, use a hair dryer on the cool setting to dry it.  If it gets soaked, call the office to schedule an appointment for a cast change.  After your dressing, cast or splint is removed; you may shower, but do not soak or scrub the wound.  Allow the water to run over it, and then gently pat it dry.  Swelling It is normal for you to have swelling where you had surgery.  To reduce swelling and pain, keep your toes above your nose for at least 3 days after surgery.  It may be necessary to keep your foot or leg elevated for several weeks.  If it hurts, it should be elevated.  Follow Up Call my office at [phone] when you are discharged from the hospital or surgery center to schedule an appointment to be seen two weeks after surgery.  Call my office at [phone] if you develop a fever >101.5 F, nausea, vomiting, bleeding from the surgical site or severe pain.     Post Anesthesia Home Care  Instructions  Activity: Get plenty of rest for the remainder of the day. A responsible individual must stay with you for 24 hours following the procedure.  For the next 24 hours, DO NOT: -Drive a car -Paediatric nurse -Drink alcoholic beverages -Take any medication unless instructed by your physician -Make any legal decisions or sign important papers.  Meals: Start with liquid foods such as gelatin or soup. Progress to regular foods as tolerated. Avoid greasy, spicy, heavy foods. If nausea and/or vomiting occur, drink only clear liquids until the nausea and/or vomiting subsides. Call your physician if vomiting continues.  Special Instructions/Symptoms: Your throat may feel dry or sore from the anesthesia or the breathing tube placed in your throat during surgery. If this causes discomfort, gargle with warm salt water. The discomfort should disappear within 24 hours.  If you had a scopolamine patch placed behind your ear for the management of post- operative nausea and/or vomiting:  1. The medication in the patch is effective for 72 hours, after which it should be removed.  Wrap patch in a tissue and discard in the trash. Wash hands thoroughly with soap and water. 2. You may remove the patch earlier than 72 hours if you experience unpleasant side effects which may include dry mouth, dizziness or visual disturbances. 3. Avoid touching the patch. Wash your hands with soap and water after contact with the patch.   Regional Anesthesia Blocks  1. Numbness or the inability to move the "blocked" extremity may last from 3-48 hours after placement. The length of time depends on the medication injected and your individual response to the medication. If the numbness is not going away after 48 hours, call your surgeon.  2. The extremity that is blocked will need to be protected until the numbness is gone and the  Strength has returned. Because you cannot feel it, you will need to take extra care to  avoid injury. Because it may be weak, you may have difficulty moving it or using it. You may not know what position it is in without looking at it while the block is in effect.  3. For blocks in the legs and feet, returning to weight bearing and walking needs to be done carefully. You will need to wait until the numbness is entirely gone and the strength has returned. You should be able to move your leg and foot normally before you try and bear weight or walk. You will need someone to be with you when you first try to ensure you do not fall and possibly risk injury.  4. Bruising and tenderness at the needle site are common side effects and will resolve in a few days.  5. Persistent numbness or new problems with movement should be communicated to the surgeon or the [facility] [phone] [place] [phone]).

## 2018-04-11 NOTE — Op Note (Signed)
04/11/2018  9:49 AM  PATIENT:  Tracy Coleman  42 y.o. female  PRE-OPERATIVE DIAGNOSIS:  Right halluax rigidus  POST-OPERATIVE DIAGNOSIS:  Right Hallux Rigidus  Procedure(s): 1.  Right hallux MPJ arthrodesis 2.  Right foot AP and lateral xrays  SURGEON:  Wylene Simmer, MD  ASSISTANT: Mechele Claude, PA-C  ANESTHESIA:   General, regional  EBL:  minimal   TOURNIQUET:  34 min at 409 mm Hg  COMPLICATIONS:  None apparent  DISPOSITION:  Extubated, awake and stable to recovery.  INDICATION FOR PROCEDURE: The patient is a 42 year old female with a long history of right forefoot pain due to hallux rigidus.  She has failed nonoperative treatment to date including activity modification, oral anti-inflammatories and shoe wear modification.  She presents now for operative treatment of this painful condition.  The risks and benefits of the alternative treatment options have been discussed in detail.  The patient wishes to proceed with surgery and specifically understands risks of bleeding, infection, nerve damage, blood clots, need for additional surgery, amputation and death.    PROCEDURE IN DETAIL:  After pre operative consent was obtained, and the correct operative site was identified, the patient was brought to the operating room and placed supine on the OR table.  Anesthesia was administered.  Pre-operative antibiotics were administered.  A surgical timeout was taken.  The right lower extremity was prepped and draped in standard sterile fashion with a tourniquet around the thigh.  The extremity was elevated and the tourniquet was inflated to 250 mmHg.  A longitudinal incision was made over the hallux MP joint.  Dissection was carried down through the subcutaneous tissues taking care to protect the extensor tendons.  The dorsal joint capsule was incised and elevated medially and laterally.  The collateral ligaments were released.  The hallux MP joint was noted to have severe arthritis with complete  loss of the cartilage on both sides of the joint.  There were large osteophytes around the head of the metatarsal.  These were removed with a rondure.  A K wire was inserted in the center of the metatarsal head.  A concave reamer was used to remove the remaining subchondral bone.  The K wire was then placed in the base of the proximal phalanx.  A convex reamer was then used to remove the remaining subchondral bone.  The wound was irrigated copiously.  The joint was reduced and provisionally pinned.  Radiographs and physical exam confirmed appropriate alignment of the joint.  The joint was then compressed with a 4 mm Biomet cannulated screw.  A 4 hole one quarter tubular plate from the Biomet mini frag set was then contoured to fit the joint.  It was secured proximally and distally with bicortical screws.  Final AP and lateral radiographs confirmed appropriate position and length of all hardware in appropriate compression and alignment of the hallux MP joint.  Wounds were irrigated copiously.  The dorsal joint capsule was repaired with Vicryl.  Subtenons tissues were closed with Monocryl.  The skin was closed with nylon.  Sterile dressings were applied followed by a cam walker boot.  Tourniquet was released after application of the dressings.  The patient was awakened from anesthesia and transported to the recovery room in stable condition.   FOLLOW UP PLAN: Weightbearing as tolerated on the right heel in a cam boot.  Follow-up in 2 weeks for suture removal.  Aspirin 81 mg twice a day for DVT prophylaxis.   RADIOGRAPHS: AP and lateral radiographs of the  right foot are obtained intraoperatively.  These show interval arthrodesis of the hallux MP joint.  Hardware is appropriately positioned and of the appropriate lengths.    Mechele Claude PA-C was present and scrubbed for the duration of the operative case. His assistance was essential in positioning the patient, prepping and draping, gaining and maintaining  exposure, performing the operation, closing and dressing the wounds and applying the splint.

## 2018-04-11 NOTE — Anesthesia Procedure Notes (Signed)
Anesthesia Regional Block: Popliteal block   Pre-Anesthetic Checklist: ,, timeout performed, Correct Patient, Correct Site, Correct Laterality, Correct Procedure, Correct Position, site marked, Risks and benefits discussed,  Surgical consent,  Pre-op evaluation,  At surgeon's request and post-op pain management  Laterality: Right  Prep: chloraprep       Needles:  Injection technique: Single-shot  Needle Type: Echogenic Stimulator Needle          Additional Needles:   Narrative:  Start time: 04/11/2018 7:55 AM End time: 04/11/2018 8:06 AM Injection made incrementally with aspirations every 5 mL.  Performed by: Personally  Anesthesiologist: Duane Boston, MD  Additional Notes: A functioning IV was confirmed and monitors were applied.  Sterile prep and drape, hand hygiene and sterile gloves were used.  Negative aspiration and test dose prior to incremental administration of local anesthetic. The patient tolerated the procedure well.Ultrasound  guidance: relevant anatomy identified, needle position confirmed, local anesthetic spread visualized around nerve(s), vascular puncture avoided.  Image printed for medical record.

## 2018-04-15 ENCOUNTER — Encounter (HOSPITAL_BASED_OUTPATIENT_CLINIC_OR_DEPARTMENT_OTHER): Payer: Self-pay | Admitting: Orthopedic Surgery

## 2018-08-29 ENCOUNTER — Encounter: Payer: Self-pay | Admitting: Family Medicine

## 2018-09-19 NOTE — Progress Notes (Signed)
Subjective:   Patient ID: Tracy Coleman    DOB: [birth year], 42 y.o. female   MRN: [id number]  Tracy Coleman is a 42 y.o. female with a history of HTN, obesity, anxiety, depression here for   Establish Care - HTN - amlodipine 5mg  daily, lisinopril 40mg  daily, chlorthalidone 25mg  daily - anxiety/depression  - Surgical Hx - R hallux MP arthrodesis 04/2018 - Social Hx - lives at home with 1 daughter, 1 niece, husband. - no FH of MI/CVA <50yo - just moved to [place] a year ago from [place] - contraception: IUD, 02/2018? Scheduled to come out last August. Placed in 2013. Wants it replaced with another Mirena - currently sexually active with 1 female partner, with husband - G2P1 - Denies abnormal vaginal discharge, bleeding, sores/ulcers - pap smear due, previous LEEP for +HPV.  Depression - takes wellbutrin 300mg  daily, celexa 40mg  daily although has not been able to take regularly as she has not been able to establish with medical provider until now. - Difficulty sleeping - isn't taking trazodone, doesn't work well for her. Used to take [place] before. Up til 4-5am, can sleep until 9am. Gets into bed at 11pm. Mind races at night. Will watch TV or crochet to fall asleep.  Hasn't tried melatonin. Watches tv before bed, no bedtime routine.  - husband has h/o schizophrenia and recently has not been on medications. He "had a break" on Sunday where she became scared. She wants resources for potential places for safe housing  Review of Systems:  Per HPI.  [place], medications and smoking status reviewed.  Objective:   BP 134/74   Pulse 68   Ht 5' 5.5" (1.664 m)   Wt 255 lb (115.7 kg)   SpO2 99%   BMI 41.79 kg/m  Vitals and nursing note reviewed.  General: obese female with non-toxic appearance, tearful on exam CV: regular rate and rhythm without murmurs, rubs, or gallops, no lower extremity edema Lungs: clear to auscultation bilaterally with normal work of  breathing Skin: warm, dry, no rashes or lesions Extremities: warm and well perfused, normal tone MSK: ROM grossly intact, strength intact, gait normal Neuro: Alert and oriented, speech normal Psych: appropriately dressed, sad mood, appropriate affect  Assessment & Plan:   Essential hypertension At goal, will continue current regimen. Refills provided.  Depression Fatigue and difficulty sleeping likely related to insufficient treatment of depression given she hasn't been able to take her medications regularly. Will provide refills, f/u in one month to determine need for titration at that time. Will send message to integrated care to establish for counseling. Handout given regarding safe housing options should the need arise.  Orders Placed This Encounter  Procedures  . MM DIGITAL SCREENING BILATERAL    Standing Status:   Future    Standing Expiration Date:   09/21/2019    Order Specific Question:   Reason for Exam (SYMPTOM  OR DIAGNOSIS REQUIRED)    Answer:   screening for breast cancer    Order Specific Question:   Is the patient pregnant?    Answer:   No    Order Specific Question:   Preferred imaging location?    Answer:   [facility]  . Flu Vaccine QUAD 36+ mos IM   Meds ordered this encounter  Medications  . amLODipine (NORVASC) 5 MG tablet    Sig: Take 1 tablet (5 mg total) by mouth daily.    Dispense:  90 tablet    Refill:  3  .  chlorthalidone (HYGROTON) 25 MG tablet    Sig: Take 1 tablet (25 mg total) by mouth daily.    Dispense:  90 tablet    Refill:  3  . lisinopril (PRINIVIL,ZESTRIL) 40 MG tablet    Sig: Take 1 tablet (40 mg total) by mouth daily.    Dispense:  90 tablet    Refill:  3  . buPROPion (WELLBUTRIN XL) 300 MG 24 hr tablet    Sig: Take 1 tablet (300 mg total) by mouth daily.    Dispense:  90 tablet    Refill:  0  . citalopram (CELEXA) 40 MG tablet    Sig: Take 1 tablet (40 mg total) by mouth daily.    Dispense:  90 tablet    Refill:  0     Rory Percy, DO PGY-2, [facility] 09/20/2018 6:37 PM

## 2018-09-20 ENCOUNTER — Other Ambulatory Visit: Payer: Self-pay

## 2018-09-20 ENCOUNTER — Ambulatory Visit: Payer: Medicaid Other | Admitting: Family Medicine

## 2018-09-20 ENCOUNTER — Encounter: Payer: Self-pay | Admitting: Family Medicine

## 2018-09-20 VITALS — BP 134/74 | HR 68 | Ht 65.5 in | Wt 255.0 lb

## 2018-09-20 DIAGNOSIS — Z1239 Encounter for other screening for malignant neoplasm of breast: Secondary | ICD-10-CM

## 2018-09-20 DIAGNOSIS — Z23 Encounter for immunization: Secondary | ICD-10-CM

## 2018-09-20 DIAGNOSIS — I10 Essential (primary) hypertension: Secondary | ICD-10-CM | POA: Diagnosis not present

## 2018-09-20 DIAGNOSIS — F329 Major depressive disorder, single episode, unspecified: Secondary | ICD-10-CM

## 2018-09-20 DIAGNOSIS — F32A Depression, unspecified: Secondary | ICD-10-CM

## 2018-09-20 MED ORDER — CITALOPRAM HYDROBROMIDE 40 MG PO TABS: 40.0000 mg | ORAL_TABLET | Freq: Every day | ORAL | 0 refills | Status: DC

## 2018-09-20 MED ORDER — CHLORTHALIDONE 25 MG PO TABS: 25.0000 mg | ORAL_TABLET | Freq: Every day | ORAL | 3 refills | Status: DC

## 2018-09-20 MED ORDER — AMLODIPINE BESYLATE 5 MG PO TABS
5.0000 mg | ORAL_TABLET | Freq: Every day | ORAL | 3 refills | Status: DC
Start: 2018-09-20 — End: 2019-10-06

## 2018-09-20 MED ORDER — LISINOPRIL 40 MG PO TABS: 40.0000 mg | ORAL_TABLET | Freq: Every day | ORAL | 3 refills | Status: DC

## 2018-09-20 MED ORDER — BUPROPION HCL ER (XL) 300 MG PO TB24: 300.0000 mg | ORAL_TABLET | Freq: Every day | ORAL | 0 refills | Status: DC

## 2018-09-20 NOTE — Patient Instructions (Addendum)
It was great to see you!  Our plans for today:  - Set up an appointment for pap smear, at that time we can replace your Mirena. - Expect a call from our integrated care team to set up an appointment. - We placed an order for your mammogram. - We refilled your medications. - Follow up in one month.  Take care and seek immediate care sooner if you develop any concerns.   Dr. [facility]

## 2018-09-20 NOTE — Assessment & Plan Note (Signed)
Fatigue and difficulty sleeping likely related to insufficient treatment of depression given she hasn't been able to take her medications regularly. Will provide refills, f/u in one month to determine need for titration at that time. Will send message to integrated care to establish for counseling. Handout given regarding safe housing options should the need arise.

## 2018-09-20 NOTE — Assessment & Plan Note (Signed)
At goal, will continue current regimen. Refills provided.

## 2018-09-24 ENCOUNTER — Telehealth: Payer: Self-pay | Admitting: Licensed Clinical Social Worker

## 2018-09-24 NOTE — Progress Notes (Signed)
  Service : [place]  LCSW received consult from Dr. Ky Barban ref. Contacting patient to schedule appointment with [facility]. Called patient.  Appointment scheduled Monday at 1:30 with Jackalyn Lombard.  Casimer Lanius, [place]   [phone] 9:01 AM

## 2018-09-30 ENCOUNTER — Ambulatory Visit: Payer: Self-pay

## 2018-10-07 ENCOUNTER — Ambulatory Visit: Payer: Self-pay

## 2018-10-14 ENCOUNTER — Other Ambulatory Visit: Payer: Self-pay

## 2018-10-14 ENCOUNTER — Ambulatory Visit (HOSPITAL_COMMUNITY)
Admission: EM | Admit: 2018-10-14 | Discharge: 2018-10-14 | Disposition: A | Discharge status: Home / Self Care | Payer: Medicaid Other | Attending: Family Medicine | Admitting: Family Medicine

## 2018-10-14 ENCOUNTER — Ambulatory Visit (INDEPENDENT_AMBULATORY_CARE_PROVIDER_SITE_OTHER): Payer: Medicaid Other

## 2018-10-14 ENCOUNTER — Encounter (HOSPITAL_COMMUNITY): Payer: Self-pay | Admitting: *Deleted

## 2018-10-14 DIAGNOSIS — M25571 Pain in right ankle and joints of right foot: Secondary | ICD-10-CM

## 2018-10-14 MED ORDER — MELOXICAM 7.5 MG PO TABS: 7.5000 mg | ORAL_TABLET | Freq: Every day | ORAL | 0 refills | Status: DC

## 2018-10-14 NOTE — ED Notes (Signed)
Pt ambulated to room 9 and tolerated it well.  She did not favor the ankle.

## 2018-10-14 NOTE — Discharge Instructions (Addendum)
It was nice meeting you!!  The x ray showed some degenerative changes as seen before This is likely arthritis We will treat with meloxicam Rest, elevate the ankle.  Orthotics in the shoe could help.  If symptoms continue you may want to follow up with orthopedics for further management . Sometimes they will do steroid injections for this type of problem.

## 2018-10-14 NOTE — ED Triage Notes (Signed)
C/o pain in right ankle pain radiates into her left lower leg x 3 days.

## 2018-10-15 NOTE — ED Provider Notes (Signed)
[name]    CSN: [id number] Arrival date & time: 10/14/18  1544     History   Chief Complaint Chief Complaint  Patient presents with  . Ankle Pain    HPI Tracy Coleman is a 42 y.o. female.   Patient is a 42 year old female presents with right ankle pain that has been constant for the past 3 days.  The pain radiates to the anterior part of the lower extremity.  Her pain has worsened.  She has been taking ibuprofen for pain.  She denies any injuries to the leg.  She does have a history of arthritis.  This feels consistent with that.  Typically ibuprofen helps with the pain.  She denies any calf pain, swelling, erythema or increased warmth.  She denies any history of DVT.  She denies any fever, chills, body aches, fatigue, night sweats.  ROS per HPI      Past Medical History:  Diagnosis Date  . Anxiety   . Arthritis   . Asthma   . Depression   . Hypertension   . Obesity   . Post traumatic stress disorder (PTSD)   . PTSD (post-traumatic stress disorder)   . PTSD (post-traumatic stress disorder)     Patient Active Problem List   Diagnosis Date Noted  . 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 Essential hypertension 05/03/2013  . Obesity 05/03/2013  . Anxiety 05/03/2013  . Depression 05/03/2013    Past Surgical History:  Procedure Laterality Date  . ARTHRODESIS METATARSALPHALANGEAL JOINT (MTPJ) Right 04/11/2018   Procedure: ARTHRODESIS METATARSALPHALANGEAL JOINT (MTPJ);  Surgeon: Wylene Simmer, MD;  Location: [facility];  Service: Orthopedics;  Laterality: Right;    OB History   None      Home Medications    Prior to Admission medications   Medication Sig Start Date End Date Taking? Authorizing Provider  albuterol (PROVENTIL HFA) 108 (90 Base) MCG/ACT inhaler Inhale into the lungs. 04/30/13   [provider]  amLODipine (NORVASC) 5 MG tablet Take 1 tablet (5 mg total) by mouth daily. 09/20/18   Rory Percy, DO  buPROPion  (WELLBUTRIN XL) 300 MG 24 hr tablet Take 1 tablet (300 mg total) by mouth daily. 09/20/18   Rory Percy, DO  cetirizine-pseudoephedrine (ZYRTEC-D) 5-120 MG tablet Take by mouth.    [provider]  chlorthalidone (HYGROTON) 25 MG tablet Take 1 tablet (25 mg total) by mouth daily. 09/20/18   Rory Percy, DO  citalopram (CELEXA) 40 MG tablet Take 1 tablet (40 mg total) by mouth daily. 09/20/18   Rory Percy, DO  docusate sodium (COLACE) 100 MG capsule Take 1 capsule (100 mg total) by mouth 2 (two) times daily. While taking narcotic pain medicine. 04/11/18   Corky Sing, PA-C  levonorgestrel (MIRENA) 20 MCG/24HR IUD by Intrauterine route.    [provider]  lisinopril (PRINIVIL,ZESTRIL) 40 MG tablet Take 1 tablet (40 mg total) by mouth daily. 09/20/18   Rory Percy, DO  meloxicam (MOBIC) 7.5 MG tablet Take 1 tablet (7.5 mg total) by mouth daily. 10/14/18   Orvan July, NP  Multiple Vitamin (THERA) TABS Take by mouth.    [provider]  prazosin (MINIPRESS) 1 MG capsule Take 1 mg by mouth at bedtime.    [provider]  senna (SENOKOT) 8.6 MG TABS tablet Take 2 tablets (17.2 mg total) by mouth 2 (two) times daily. 04/11/18   Corky Sing, PA-C  traZODone (DESYREL) 100 MG tablet Take 200 mg by  mouth.  03/05/18   [provider]    Family History Family History  Problem Relation Age of Onset  . Alcohol abuse Mother   . Drug abuse Mother   . Depression Mother   . Early death Mother        pneumonia, age 40  . Hypertension Mother   . Alcohol abuse Father   . Drug abuse Father   . Diabetes Maternal Grandmother   . Hypertension Maternal Grandmother   . Diabetes Paternal Grandmother   . Hypertension Paternal Grandmother     Social History Social History   Tobacco Use  . Smoking status: Former Smoker    Last attempt to quit: 01/11/2018    Years since quitting: 0.7  . Smokeless tobacco: Never Used  Substance Use Topics    . Alcohol use: Yes    Alcohol/week: 2.0 standard drinks    Types: 2 Glasses of wine per week    Frequency: Never    Comment: occassional  . Drug use: Yes    Types: Marijuana    Comment: last 04-10-18 (nearly daily for anxiety)     Allergies   Varenicline   Review of Systems Review of Systems   Physical Exam Triage Vital Signs ED Triage Vitals  Enc Vitals Group     BP 10/14/18 1557 124/76     Pulse Rate 10/14/18 1557 87     Resp --      Temp 10/14/18 1557 98.6 F (37 C)     Temp Source 10/14/18 1557 Oral     SpO2 10/14/18 1557 98 %     Weight --      Height --      Head Circumference --      Peak Flow --      Pain Score 10/14/18 1601 10     Pain Loc --      Pain Edu? --      Excl. in [place]? --    No data found.  Updated Vital Signs BP 124/76 (BP Location: Right Arm)   Pulse 87   Temp 98.6 F (37 C) (Oral)   SpO2 98%   Visual Acuity Right Eye Distance:   Left Eye Distance:   Bilateral Distance:    Right Eye Near:   Left Eye Near:    Bilateral Near:     Physical Exam  Constitutional: She appears well-developed and well-nourished.  Very pleasant. Non toxic or ill appearing.   HENT:  Head: Normocephalic and atraumatic.  Eyes: Conjunctivae are normal.  Neck: Normal range of motion.  Pulmonary/Chest: Effort normal.  Musculoskeletal: Normal range of motion.  Good ROM with the right ankle. Mild swelling to the right lateral malleolus.  No deformity, erythema, bruising.  Neurological: She is alert.  Skin: Skin is warm.  Psychiatric: She has a normal mood and affect.  Nursing note and vitals reviewed.    UC Treatments / Results  Labs (all labs ordered are listed, but only abnormal results are displayed) Labs Reviewed - No data to display  EKG None  Radiology Dg Ankle Complete Right  Result Date: 10/14/2018 CLINICAL DATA:  Right ankle pain for 3 days. EXAM: RIGHT ANKLE - COMPLETE 3+ VIEW COMPARISON:  None. FINDINGS: Right ankle is located without  a fracture. No significant soft tissue swelling. Significant degenerative changes and spurring at the dorsal aspect of the talonavicular joint. Normal alignment of the right ankle. IMPRESSION: No acute abnormality to the right ankle. 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  Electronically Signed   By: Markus Daft M.D.   On: 10/14/2018 16:55    Procedures Procedures (including critical care time)  Medications Ordered in UC Medications - No data to display  Initial Impression / Assessment and Plan / UC Course  I have reviewed the triage vital signs and the nursing notes.  Pertinent labs & imaging results that were available during my care of the patient were reviewed by me and considered in my medical decision making (see chart for details).     X-ray negative Patient symptoms most likely due to her arthritis We will try meloxicam for pain and inflammation Instructed she could try orthotic inserts in her shoes to see if this could help with the discomfort. If she continues to have symptoms she will need to follow-up with orthopedic Final Clinical Impressions(s) / UC Diagnoses   Final diagnoses:  Right ankle pain, unspecified chronicity     Discharge Instructions     It was nice meeting you!!  The x ray showed some degenerative changes as seen before This is likely arthritis We will treat with meloxicam Rest, elevate the ankle.  Orthotics in the shoe could help.  If symptoms continue you may want to follow up with orthopedics for further management . Sometimes they will do steroid injections for this type of problem.     ED Prescriptions    Medication Sig Dispense Auth. Provider   meloxicam (MOBIC) 7.5 MG tablet Take 1 tablet (7.5 mg total) by mouth daily. 30 tablet Loura Halt A, NP     Controlled Substance Prescriptions [place] Controlled Substance Registry consulted? Not Applicable   Orvan July, NP 10/15/18 1041

## 2018-10-18 DIAGNOSIS — M2020 Hallux rigidus, unspecified foot: Secondary | ICD-10-CM | POA: Insufficient documentation

## 2018-10-18 DIAGNOSIS — M25571 Pain in right ankle and joints of right foot: Secondary | ICD-10-CM | POA: Insufficient documentation

## 2018-12-17 ENCOUNTER — Emergency Department (HOSPITAL_COMMUNITY): Payer: Medicaid Other

## 2018-12-17 ENCOUNTER — Encounter (HOSPITAL_COMMUNITY): Payer: Self-pay | Admitting: Emergency Medicine

## 2018-12-17 ENCOUNTER — Emergency Department (HOSPITAL_COMMUNITY)
Admission: EM | Admit: 2018-12-17 | Discharge: 2018-12-18 | Disposition: A | Discharge status: Home / Self Care | Payer: Medicaid Other | Attending: Emergency Medicine | Admitting: Emergency Medicine

## 2018-12-17 DIAGNOSIS — R0789 Other chest pain: Secondary | ICD-10-CM | POA: Diagnosis not present

## 2018-12-17 DIAGNOSIS — R1011 Right upper quadrant pain: Secondary | ICD-10-CM | POA: Diagnosis not present

## 2018-12-17 LAB — I-STAT BETA HCG BLOOD, ED (MC, WL, AP ONLY): I-stat hCG, quantitative: <5 m[IU]/mL (ref <5)

## 2018-12-17 LAB — BASIC METABOLIC PANEL
Anion gap: 9 (ref 5–15)
BUN: 15 mg/dL (ref 6–20)
CALCIUM: 9.1 mg/dL (ref 8.9–10.3)
CO2: 25 mmol/L (ref 22–32)
CREATININE: 1 mg/dL (ref 0.44–1.00)
Chloride: 103 mmol/L (ref 98–111)
GFR calc Af Amer: >60 mL/min (ref >60)
GLUCOSE: 94 mg/dL (ref 70–99)
Potassium: 3.4 mmol/L — ABNORMAL LOW (ref 3.5–5.1)
SODIUM: 137 mmol/L (ref 135–145)

## 2018-12-17 LAB — I-STAT TROPONIN, ED: TROPONIN I, POC: 0 ng/mL (ref 0.00–0.08)

## 2018-12-17 LAB — CBC
HCT: 39.2 % (ref 36.0–46.0)
HEMOGLOBIN: 13.2 g/dL (ref 12.0–15.0)
MCH: 31.3 pg (ref 26.0–34.0)
MCHC: 33.7 g/dL (ref 30.0–36.0)
MCV: 92.9 fL (ref 80.0–100.0)
Platelets: 430 10*3/uL — ABNORMAL HIGH (ref 150–400)
RBC: 4.22 MIL/uL (ref 3.87–5.11)
RDW: 13.7 % (ref 11.5–15.5)
WBC: 8.2 10*3/uL (ref 4.0–10.5)
nRBC: 0 % (ref 0.0–0.2)

## 2018-12-17 NOTE — ED Notes (Signed)
Pt wanting to know results so she can go if we are not going to see her, pt informed that there was 2 people ahead of her and we would get her back as soon as we can.

## 2018-12-17 NOTE — ED Triage Notes (Signed)
Pt was at [facility] and began to have chest pain, "felt like something was gripping at my heart".  Pt reports at first it hurt to breath.  Denies any n/v but reports she has been breaking out in "cold sweats".

## 2018-12-18 ENCOUNTER — Emergency Department (HOSPITAL_COMMUNITY): Payer: Medicaid Other

## 2018-12-18 LAB — HEPATIC FUNCTION PANEL
ALT: 27 U/L (ref 0–44)
AST: 22 U/L (ref 15–41)
Albumin: 3.7 g/dL (ref 3.5–5.0)
Alkaline Phosphatase: 63 U/L (ref 38–126)
BILIRUBIN TOTAL: 0.5 mg/dL (ref 0.3–1.2)
Bilirubin, Direct: <0.1 mg/dL (ref 0.0–0.2)
Total Protein: 7.4 g/dL (ref 6.5–8.1)

## 2018-12-18 LAB — LIPASE, BLOOD: Lipase: 23 U/L (ref 11–51)

## 2018-12-18 LAB — I-STAT TROPONIN, ED: Troponin i, poc: 0.01 ng/mL (ref 0.00–0.08)

## 2018-12-18 LAB — D-DIMER, QUANTITATIVE: D-Dimer, Quant: <0.27 ug/mL-FEU (ref 0.00–0.50)

## 2018-12-18 MED ORDER — FAMOTIDINE 20 MG PO TABS: 20.0000 mg | ORAL_TABLET | Freq: Every day | ORAL | 0 refills | Status: DC

## 2018-12-18 MED ORDER — HYDROMORPHONE HCL 1 MG/ML IJ SOLN
1.0000 mg | Freq: Once | INTRAMUSCULAR | Status: AC
  Administered 2018-12-18: 1 mg via INTRAVENOUS
  Filled 2018-12-18: qty 1

## 2018-12-18 MED ORDER — ONDANSETRON HCL 4 MG/2ML IJ SOLN
4.0000 mg | Freq: Once | INTRAMUSCULAR | Status: AC
  Administered 2018-12-18: 4 mg via INTRAVENOUS
  Filled 2018-12-18: qty 2

## 2018-12-18 MED ORDER — NAPROXEN 375 MG PO TABS: 375.0000 mg | ORAL_TABLET | Freq: Two times a day (BID) | ORAL | 0 refills | Status: AC

## 2018-12-18 MED ORDER — METHOCARBAMOL 500 MG PO TABS: 500.0000 mg | ORAL_TABLET | Freq: Three times a day (TID) | ORAL | 0 refills | Status: AC

## 2018-12-18 NOTE — ED Provider Notes (Signed)
[facility] EMERGENCY DEPARTMENT Provider Note   CSN: [id number] Arrival date & time: 12/17/18  1955     History   Chief Complaint Chief Complaint  Patient presents with  . Chest Pain    HPI Tracy Coleman is a 43 y.o. female.  HPI   43 yo F with PMHx as below here with chest pain. Pt reports she was in her usual state of health throughout the day today. She ate dinner and went to [facility]. While checking out, she developed acute onset of sharp epigastric pain radiating to her back. She had associated shortness of breath 2/2 pain with inspiration. She states the pain has persisted since then, with occasional radiation to back and RUQ. No headache or neuro sx. Mild nausea noted but no vomiting. No h/o CAD. No recent illnesses. She was well prior to onset of pain. She has not noticed any reliving factors. No aggravating factors other htan movement, deep inspiration. She smokes, denies any h/o DVT or recent immobilization.  Past Medical History:  Diagnosis Date  . Anxiety   . Arthritis   . Asthma   . Depression   . Hypertension   . Obesity   . Post traumatic stress disorder (PTSD)   . PTSD (post-traumatic stress disorder)   . PTSD (post-traumatic stress disorder)     Patient Active Problem List   Diagnosis Date Noted  . 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 Essential hypertension 05/03/2013  . Obesity 05/03/2013  . Anxiety 05/03/2013  . Depression 05/03/2013    Past Surgical History:  Procedure Laterality Date  . ARTHRODESIS METATARSALPHALANGEAL JOINT (MTPJ) Right 04/11/2018   Procedure: ARTHRODESIS METATARSALPHALANGEAL JOINT (MTPJ);  Surgeon: Wylene Simmer, MD;  Location: [facility];  Service: Orthopedics;  Laterality: Right;     OB History   No obstetric history on file.      Home Medications    Prior to Admission medications   Medication Sig Start Date End Date Taking? Authorizing Provider  albuterol (PROVENTIL HFA) 108 (90 Base)  MCG/ACT inhaler Inhale into the lungs. 04/30/13   [provider]  amLODipine (NORVASC) 5 MG tablet Take 1 tablet (5 mg total) by mouth daily. 09/20/18   Rory Percy, DO  buPROPion (WELLBUTRIN XL) 300 MG 24 hr tablet Take 1 tablet (300 mg total) by mouth daily. 09/20/18   Rory Percy, DO  cetirizine-pseudoephedrine (ZYRTEC-D) 5-120 MG tablet Take by mouth.    [provider]  chlorthalidone (HYGROTON) 25 MG tablet Take 1 tablet (25 mg total) by mouth daily. 09/20/18   Rory Percy, DO  citalopram (CELEXA) 40 MG tablet Take 1 tablet (40 mg total) by mouth daily. 09/20/18   Rory Percy, DO  docusate sodium (COLACE) 100 MG capsule Take 1 capsule (100 mg total) by mouth 2 (two) times daily. While taking narcotic pain medicine. 04/11/18   Corky Sing, PA-C  famotidine (PEPCID) 20 MG tablet Take 1 tablet (20 mg total) by mouth daily for 5 days. 12/18/18 12/23/18  Duffy Bruce, MD  levonorgestrel (MIRENA) 20 MCG/24HR IUD by Intrauterine route.    [provider]  lisinopril (PRINIVIL,ZESTRIL) 40 MG tablet Take 1 tablet (40 mg total) by mouth daily. 09/20/18   Rory Percy, DO  meloxicam (MOBIC) 7.5 MG tablet Take 1 tablet (7.5 mg total) by mouth daily. 10/14/18   Loura Halt A, NP  methocarbamol (ROBAXIN) 500 MG tablet Take 1 tablet (500 mg total) by mouth 3 (three) times daily for 5 days. 12/18/18  12/23/18  Duffy Bruce, MD  Multiple Vitamin (THERA) TABS Take by mouth.    [provider]  naproxen (NAPROSYN) 375 MG tablet Take 1 tablet (375 mg total) by mouth 2 (two) times daily with a meal for 5 days. 12/18/18 12/23/18  Duffy Bruce, MD  prazosin (MINIPRESS) 1 MG capsule Take 1 mg by mouth at bedtime.    [provider]  senna (SENOKOT) 8.6 MG TABS tablet Take 2 tablets (17.2 mg total) by mouth 2 (two) times daily. 04/11/18   Corky Sing, PA-C  traZODone (DESYREL) 100 MG tablet Take 200 mg by mouth.  03/05/18   [provider]      Family History Family History  Problem Relation Age of Onset  . Alcohol abuse Mother   . Drug abuse Mother   . Depression Mother   . Early death Mother        pneumonia, age 59  . Hypertension Mother   . Alcohol abuse Father   . Drug abuse Father   . Diabetes Maternal Grandmother   . Hypertension Maternal Grandmother   . Diabetes Paternal Grandmother   . Hypertension Paternal Grandmother     Social History Social History   Tobacco Use  . Smoking status: Former Smoker    Last attempt to quit: 01/11/2018    Years since quitting: 0.9  . Smokeless tobacco: Never Used  Substance Use Topics  . Alcohol use: Yes    Alcohol/week: 2.0 standard drinks    Types: 2 Glasses of wine per week    Frequency: Never    Comment: occassional  . Drug use: Yes    Types: Marijuana    Comment: last 04-10-18 (nearly daily for anxiety)     Allergies   Varenicline   Review of Systems Review of Systems  Constitutional: Positive for fatigue. Negative for chills and fever.  HENT: Negative for congestion and rhinorrhea.   Eyes: Negative for visual disturbance.  Respiratory: Positive for shortness of breath (when pain is severe). Negative for cough and wheezing.   Cardiovascular: Positive for chest pain. Negative for leg swelling.  Gastrointestinal: Positive for nausea. Negative for abdominal pain, diarrhea and vomiting.  Genitourinary: Negative for dysuria and flank pain.  Musculoskeletal: Negative for neck pain and neck stiffness.  Skin: Negative for rash and wound.  Allergic/Immunologic: Negative for immunocompromised state.  Neurological: Negative for syncope, weakness and headaches.  All other systems reviewed and are negative.    Physical Exam Updated Vital Signs BP 119/79 (BP Location: Right Arm)   Pulse 73   Temp 98.3 F (36.8 C) (Oral)   Resp 18   Ht 5\' 4"  (1.626 m)   Wt 108.9 kg   LMP 12/17/2018   SpO2 100%   BMI 41.20 kg/m   Physical Exam Vitals signs and nursing  note reviewed.  Constitutional:      General: She is not in acute distress.    Appearance: She is well-developed.  HENT:     Head: Normocephalic and atraumatic.  Eyes:     Conjunctiva/sclera: Conjunctivae normal.  Neck:     Musculoskeletal: Neck supple.  Cardiovascular:     Rate and Rhythm: Normal rate and regular rhythm.     Heart sounds: Normal heart sounds. No murmur. No friction rub.  Pulmonary:     Effort: Pulmonary effort is normal. No respiratory distress.     Breath sounds: Normal breath sounds. No wheezing or rales.  Abdominal:     General: There is no distension.  Palpations: Abdomen is soft.     Tenderness: There is abdominal tenderness in the right upper quadrant and epigastric area. Positive signs include Murphy's sign. Negative signs include Rovsing's sign and McBurney's sign.  Skin:    General: Skin is warm.     Capillary Refill: Capillary refill takes less than 2 seconds.  Neurological:     Mental Status: She is alert and oriented to person, place, and time.     Motor: No abnormal muscle tone.      ED Treatments / Results  Labs (all labs ordered are listed, but only abnormal results are displayed) Labs Reviewed  BASIC METABOLIC PANEL - Abnormal; Notable for the following components:      Result Value   Potassium 3.4 (*)    All other components within normal limits  CBC - Abnormal; Notable for the following components:   Platelets 430 (*)    All other components within normal limits  LIPASE, BLOOD  HEPATIC FUNCTION PANEL  D-DIMER, QUANTITATIVE (NOT AT [facility])  I-STAT TROPONIN, ED  I-STAT BETA HCG BLOOD, ED ([facility], [facility], [facility] ONLY)  I-STAT TROPONIN, ED    EKG EKG Interpretation  Date/Time:  Tuesday December 17 2018 20:03:36 EST Ventricular Rate:  84 PR Interval:  168 QRS Duration: 78 QT Interval:  382 QTC Calculation: 451 R Axis:   -2 Text Interpretation:  Normal sinus rhythm Normal ECG No significant change since last tracing Confirmed by Duffy Bruce [phone]) on 12/17/2018 11:49:33 PM   Radiology Dg Chest 2 View  Result Date: 12/17/2018 CLINICAL DATA:  Left-sided chest pain while in a store tonight. EXAM: CHEST - 2 VIEW COMPARISON:  None. FINDINGS: Top-normal heart size. Nonaneurysmal thoracic aorta. Clear lungs. No acute osseous abnormality. IMPRESSION: No active cardiopulmonary disease. Electronically Signed   By: Ashley Royalty M.D.   On: 12/17/2018 20:34   US Abdomen Limited Ruq  Result Date: 12/18/2018 CLINICAL DATA:  Right upper quadrant pain. EXAM: ULTRASOUND ABDOMEN LIMITED RIGHT UPPER QUADRANT COMPARISON:  None. FINDINGS: Gallbladder: Physiologically distended. No gallstones or wall thickening visualized. No sonographic Murphy sign noted by sonographer. Common bile duct: Diameter: 5 mm, normal. Liver: No focal lesion identified. Mildly increased stent parenchymal echogenicity compared to right kidney. Portal vein is patent on color Doppler imaging with normal direction of blood flow towards the liver. IMPRESSION: 1. Normal sonographic appearance of the gallbladder. No biliary dilatation. 2. Mild hepatic steatosis. Electronically Signed   By: Keith Rake M.D.   On: 12/18/2018 01:31    Procedures Procedures (including critical care time)  Medications Ordered in ED Medications  HYDROmorphone (DILAUDID) injection 1 mg (1 mg Intravenous Given 12/18/18 0113)  ondansetron (ZOFRAN) injection 4 mg (4 mg Intravenous Given 12/18/18 0113)     Initial Impression / Assessment and Plan / ED Course  I have reviewed the triage vital signs and the nursing notes.  Pertinent labs & imaging results that were available during my care of the patient were reviewed by me and considered in my medical decision making (see chart for details).  Clinical Course as of Dec 19 327  Wed Dec 18, 2018  0048 43 yo F here with CP, radiating to back. DDx includes MSK chest wall pain, cholecystitis, gastritis/GERD, pancreatitis, also PE given obesity, tobacco  use. No estrogen use. EKG non-ischemic, doubt ACS. Trop neg x 1. Will repeat trop, check D-Dimer, LFTs and RUQ U/S.   [CI]  0107 Delta trop neg, reassuring with sx constant >8 hours now.    [  CI]  0140 D-Dime rneg. Lipase, LFTs unremarkable and U/S neg. Suspect chest wall pain. Will treat as such with good return precautions.   [CI]    Clinical Course User Index [CI] Duffy Bruce, MD     Final Clinical Impressions(s) / ED Diagnoses   Final diagnoses:  RUQ pain  Atypical chest pain    ED Discharge Orders         Ordered    methocarbamol (ROBAXIN) 500 MG tablet  3 times daily     12/18/18 0149    naproxen (NAPROSYN) 375 MG tablet  2 times daily with meals     12/18/18 0149    famotidine (PEPCID) 20 MG tablet  Daily     12/18/18 0149           Duffy Bruce, MD 12/18/18 [phone]

## 2018-12-18 NOTE — ED Notes (Signed)
Pt discharged from ED; instructions provided and scripts given; Pt encouraged to return to ED if symptoms worsen and to f/u with PCP; Pt verbalized understanding of all instructions 

## 2018-12-18 NOTE — Discharge Instructions (Signed)
Your lab work today was reassuring, with no signs of blood clot, heart attack, gallstone, or other emergent pathology.  I have prescribed meds to help with chest wall/nonspecific chest pain. Take as prescribed.  If symptoms do not improve in 3-5 days, follow-up with your Primary or call to set up an appointment as below.

## 2018-12-20 ENCOUNTER — Other Ambulatory Visit (HOSPITAL_COMMUNITY): Payer: Self-pay | Admitting: Orthopedic Surgery

## 2018-12-22 NOTE — Progress Notes (Signed)
  Subjective:   Patient ID: Tracy Coleman    DOB: [birth year], 43 y.o. female   MRN: [id number]  Tracy Coleman is a 43 y.o. female with a history of HTN, obesity, anxiety, depression here for   Contraception Management - Currently with Mirena IUD, placed in 2013. - G2P1 currently sexually active with husband. - pap smear due. H/o LEEP procedure in 2016 with f/u Pap in 2017, reports normal. - Denies abnormal discharge, bleeding, sores/ulcers  Review of Systems:  Per HPI.  [place], medications and smoking status reviewed.  Objective:   BP 118/72   Pulse 68   Temp 98.5 F (36.9 C) (Oral)   Ht 5\' 4"  (1.626 m)   Wt 253 lb (114.8 kg)   LMP 12/17/2018   SpO2 98%   BMI 43.43 kg/m  Vitals and nursing note reviewed.  General: obese female, in no acute distress with non-toxic appearance GYN:  External genitalia within normal limits, scarring noted from previous boils.  Vaginal mucosa pink, moist, normal rugae.  Unable to appreciate full view of cervix (able to see anterior lip), nonfriable. IUD strings not visible. No discharge or bleeding noted on speculum exam.  Bimanual exam revealed normal, nongravid uterus.  No cervical motion tenderness. No adnexal masses bilaterally.   Skin: warm, dry, no rashes or lesions Extremities: warm and well perfused, normal tone Neuro: Alert and oriented, speech normal  Assessment & Plan:   Contraception management Unable to obtain full view of cervix, only able to see anterior lip of cervix, IUD strings not visible on my or Dr. Cindra Coleman exam. Pap smear obtained, will call with results or if insufficient sample. Will refer to OB/GYN for removal and reinsertion of Mirena IUD.  Orders Placed This Encounter  Procedures  . Ambulatory referral to Obstetrics / Gynecology    Referral Priority:   Routine    Referral Type:   Consultation    Referral Reason:   Specialty Services Required    Requested Specialty:   Obstetrics and Gynecology    Number of Visits  Requested:   1   No orders of the defined types were placed in this encounter.   Tracy Percy, DO PGY-2, [facility] 12/23/2018 12:14 PM

## 2018-12-23 ENCOUNTER — Ambulatory Visit (INDEPENDENT_AMBULATORY_CARE_PROVIDER_SITE_OTHER): Payer: Medicaid Other | Admitting: Family Medicine

## 2018-12-23 ENCOUNTER — Encounter: Payer: Self-pay | Admitting: Family Medicine

## 2018-12-23 ENCOUNTER — Other Ambulatory Visit (HOSPITAL_COMMUNITY)
Admission: RE | Admit: 2018-12-23 | Discharge: 2018-12-23 | Disposition: A | Discharge status: Home / Self Care | Payer: Medicaid Other | Source: Ambulatory Visit | Attending: Family Medicine | Admitting: Family Medicine

## 2018-12-23 ENCOUNTER — Other Ambulatory Visit: Payer: Self-pay

## 2018-12-23 VITALS — BP 118/72 | HR 68 | Temp 98.5°F | Ht 64.0 in | Wt 253.0 lb

## 2018-12-23 DIAGNOSIS — Z975 Presence of (intrauterine) contraceptive device: Secondary | ICD-10-CM | POA: Diagnosis present

## 2018-12-23 DIAGNOSIS — Z124 Encounter for screening for malignant neoplasm of cervix: Secondary | ICD-10-CM | POA: Diagnosis present

## 2018-12-23 DIAGNOSIS — Z309 Encounter for contraceptive management, unspecified: Secondary | ICD-10-CM | POA: Insufficient documentation

## 2018-12-23 DIAGNOSIS — Z30433 Encounter for removal and reinsertion of intrauterine contraceptive device: Secondary | ICD-10-CM

## 2018-12-23 NOTE — Assessment & Plan Note (Signed)
Unable to obtain full view of cervix, only able to see anterior lip of cervix, IUD strings not visible on my or Dr. Cindra Presume exam. Pap smear obtained, will call with results or if insufficient sample. Will refer to OB/GYN for removal and reinsertion of Mirena IUD.

## 2018-12-23 NOTE — Patient Instructions (Signed)
It was great to see you!  Our plans for today:  - We placed a referral for you to have your IUD removed and replaced at an OB/GYN office. - We will call you with the results of your pap smear.   Take care and seek immediate care sooner if you develop any concerns.   Dr. [facility]
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
## 2018-12-24 ENCOUNTER — Encounter: Payer: Self-pay | Admitting: Family Medicine

## 2018-12-24 ENCOUNTER — Ambulatory Visit (INDEPENDENT_AMBULATORY_CARE_PROVIDER_SITE_OTHER): Payer: Medicaid Other | Admitting: Family Medicine

## 2018-12-24 ENCOUNTER — Other Ambulatory Visit: Payer: Self-pay

## 2018-12-24 VITALS — BP 120/80 | HR 86 | Temp 97.8°F | Wt 253.0 lb

## 2018-12-24 DIAGNOSIS — F332 Major depressive disorder, recurrent severe without psychotic features: Secondary | ICD-10-CM | POA: Diagnosis not present

## 2018-12-24 DIAGNOSIS — I10 Essential (primary) hypertension: Secondary | ICD-10-CM

## 2018-12-24 MED ORDER — FAMOTIDINE 20 MG PO TABS: 20.0000 mg | ORAL_TABLET | Freq: Every day | ORAL | 1 refills | Status: DC

## 2018-12-24 MED ORDER — CITALOPRAM HYDROBROMIDE 40 MG PO TABS: 40.0000 mg | ORAL_TABLET | Freq: Every day | ORAL | 0 refills | Status: DC

## 2018-12-24 MED ORDER — BUPROPION HCL ER (XL) 300 MG PO TB24: 300.0000 mg | ORAL_TABLET | Freq: Every day | ORAL | 0 refills | Status: DC

## 2018-12-24 NOTE — Assessment & Plan Note (Signed)
Well-controlled on current regimen. No changes today. 

## 2018-12-24 NOTE — Patient Instructions (Addendum)
It was great to see you!  Our plans for today:  - Try using a bright light around the time you wake up, you can get this on amazon (Verilux is usually the cheapest). This may help with your mood until we are able to get you in to see Psychiatry. - You should establish with a counselor (see below). - We refilled your wellbutrin and celexa today. - If you have thoughts of hurting yourself or others, you should tell someone immediately and go to the ED. Call the suicide hotline at [phone].  Take care and seek immediate care sooner if you develop any concerns.   Dr. [facility]   Psychiatry and [place], [place]  [phone]  Psychiatrists  [facility]  [address] [place] [address] [place]  [place], [place] [address] [place], [place] [address]  [phone]  Dr. Norma Fredrickson [facility]  [address] [place] [address] [place]  [place] [address]  [phone]  Sheralyn Boatman, [place]  [address] [place] [address] [place] [address]  [phone]  Therapists  [facility]  [address] [place], [place] [phone]  [facility]  [address] [place] [address] , [place] [phone]  [facility]  [address] [place]  [place], [place] [address] [place], [place] [address]  [phone]  [facility]  [address] [place], [place] [address] [place],  [address]  [phone]

## 2018-12-24 NOTE — Assessment & Plan Note (Signed)
Refills provided for celexa, wellbutrin. Given depressive symptoms not well controlled on current regimen, will refer to Psychiatry. Could try light therapy if there is a possible seasonable contribution. Again discussed importance of counseling, resources provided. Patient not actively suicidal - resources given for suicide hotline with emergency precautions given, see AVS.

## 2018-12-25 LAB — CYTOLOGY - PAP
Adequacy: ABSENT — AB
Diagnosis: UNDETERMINED — AB
HPV: NOT DETECTED

## 2019-01-03 ENCOUNTER — Inpatient Hospital Stay (HOSPITAL_COMMUNITY)
Admission: RE | Admit: 2019-01-03 | Discharge: 2019-01-06 | DRG: 881 | Disposition: A | Discharge status: Home / Self Care | Payer: Medicaid Other | Attending: Psychiatry | Admitting: Psychiatry

## 2019-01-03 DIAGNOSIS — Z818 Family history of other mental and behavioral disorders: Secondary | ICD-10-CM | POA: Diagnosis not present

## 2019-01-03 DIAGNOSIS — Z6841 Body Mass Index (BMI) 40.0 and over, adult: Secondary | ICD-10-CM | POA: Diagnosis not present

## 2019-01-03 DIAGNOSIS — F17210 Nicotine dependence, cigarettes, uncomplicated: Secondary | ICD-10-CM | POA: Diagnosis present

## 2019-01-03 DIAGNOSIS — Z888 Allergy status to other drugs, medicaments and biological substances status: Secondary | ICD-10-CM | POA: Diagnosis not present

## 2019-01-03 DIAGNOSIS — Z79899 Other long term (current) drug therapy: Secondary | ICD-10-CM

## 2019-01-03 DIAGNOSIS — F329 Major depressive disorder, single episode, unspecified: Principal | ICD-10-CM | POA: Diagnosis present

## 2019-01-03 DIAGNOSIS — Z599 Problem related to housing and economic circumstances, unspecified: Secondary | ICD-10-CM | POA: Diagnosis not present

## 2019-01-03 DIAGNOSIS — Z813 Family history of other psychoactive substance abuse and dependence: Secondary | ICD-10-CM

## 2019-01-03 DIAGNOSIS — Z833 Family history of diabetes mellitus: Secondary | ICD-10-CM | POA: Diagnosis not present

## 2019-01-03 DIAGNOSIS — I10 Essential (primary) hypertension: Secondary | ICD-10-CM | POA: Diagnosis not present

## 2019-01-03 DIAGNOSIS — R45851 Suicidal ideations: Secondary | ICD-10-CM | POA: Diagnosis not present

## 2019-01-03 DIAGNOSIS — Z791 Long term (current) use of non-steroidal anti-inflammatories (NSAID): Secondary | ICD-10-CM | POA: Diagnosis not present

## 2019-01-03 DIAGNOSIS — Z91410 Personal history of adult physical and sexual abuse: Secondary | ICD-10-CM

## 2019-01-03 DIAGNOSIS — Z8249 Family history of ischemic heart disease and other diseases of the circulatory system: Secondary | ICD-10-CM | POA: Diagnosis not present

## 2019-01-03 DIAGNOSIS — E669 Obesity, unspecified: Secondary | ICD-10-CM | POA: Diagnosis not present

## 2019-01-03 DIAGNOSIS — Z811 Family history of alcohol abuse and dependence: Secondary | ICD-10-CM | POA: Diagnosis not present

## 2019-01-03 DIAGNOSIS — F322 Major depressive disorder, single episode, severe without psychotic features: Secondary | ICD-10-CM | POA: Diagnosis present

## 2019-01-03 DIAGNOSIS — F4310 Post-traumatic stress disorder, unspecified: Secondary | ICD-10-CM | POA: Diagnosis present

## 2019-01-03 DIAGNOSIS — G4700 Insomnia, unspecified: Secondary | ICD-10-CM | POA: Diagnosis not present

## 2019-01-03 DIAGNOSIS — F1210 Cannabis abuse, uncomplicated: Secondary | ICD-10-CM | POA: Diagnosis not present

## 2019-01-03 DIAGNOSIS — F1010 Alcohol abuse, uncomplicated: Secondary | ICD-10-CM | POA: Diagnosis not present

## 2019-01-03 DIAGNOSIS — Z79891 Long term (current) use of opiate analgesic: Secondary | ICD-10-CM

## 2019-01-03 MED ORDER — QUETIAPINE FUMARATE 50 MG PO TABS
50.0000 mg | ORAL_TABLET | Freq: Every day | ORAL | Status: DC
  Filled 2019-01-03: qty 1

## 2019-01-03 MED ORDER — TEMAZEPAM 15 MG PO CAPS
15.0000 mg | ORAL_CAPSULE | Freq: Once | ORAL | Status: AC
  Administered 2019-01-03: 15 mg via ORAL
  Filled 2019-01-03: qty 1

## 2019-01-03 MED ORDER — MAGNESIUM HYDROXIDE 400 MG/5ML PO SUSP: 30.0000 mL | Freq: Every day | ORAL | Status: DC | PRN

## 2019-01-03 MED ORDER — ACETAMINOPHEN 325 MG PO TABS: 650.0000 mg | ORAL_TABLET | Freq: Four times a day (QID) | ORAL | Status: DC | PRN

## 2019-01-03 MED ORDER — HYDROXYZINE HCL 25 MG PO TABS: 25.0000 mg | ORAL_TABLET | Freq: Three times a day (TID) | ORAL | Status: DC | PRN

## 2019-01-03 MED ORDER — ALUM & MAG HYDROXIDE-SIMETH 200-200-20 MG/5ML PO SUSP: 30.0000 mL | ORAL | Status: DC | PRN
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
## 2019-01-03 NOTE — H&P (Signed)
Behavioral Health Medical Screening Exam  Tracy Coleman is an 43 y.o. female.  Total Time spent with patient: 20 minutes  Psychiatric Specialty Exam: Physical Exam  Nursing note and vitals reviewed. Constitutional: She is oriented to person, place, and time. She appears well-developed and well-nourished.  Cardiovascular: Normal rate.  Respiratory: Effort normal.  Musculoskeletal: Normal range of motion.  Neurological: She is alert and oriented to person, place, and time.  Skin: Skin is warm.    Review of Systems  Constitutional: Negative.   HENT: Negative.   Eyes: Negative.   Respiratory: Negative.   Cardiovascular: Negative.   Gastrointestinal: Negative.   Genitourinary: Negative.   Musculoskeletal: Negative.   Skin: Negative.   Neurological: Negative.   Endo/Heme/Allergies: Negative.   Psychiatric/Behavioral: Positive for depression and suicidal ideas. The patient is nervous/anxious and has insomnia.     Blood pressure 129/86, pulse 93, temperature 98.2 F (36.8 C), last menstrual period 12/17/2018, SpO2 100 %.There is no height or weight on file to calculate BMI.  General Appearance: Casual  Eye Contact:  Good  Speech:  Clear and Coherent and Normal Rate  Volume:  Decreased  Mood:  Depressed  Affect:  Depressed and Tearful  Thought Process:  Linear and Descriptions of Associations: Intact  Orientation:  Full (Time, Place, and Person)  Thought Content:  WDL  Suicidal Thoughts:  Yes.  with intent/plan  Homicidal Thoughts:  No  Memory:  Immediate;   Good Recent;   Good Remote;   Good  Judgement:  Fair  Insight:  Fair  Psychomotor Activity:  Normal  Concentration: Concentration: Good and Attention Span: Good  Recall:  Good  Fund of Knowledge:Good  Language: Good  Akathisia:  No  Handed:  Right  AIMS (if indicated):     Assets:  Communication Skills Desire for Improvement Financial Resources/Insurance Housing Social Support Transportation  Sleep:        Musculoskeletal: Strength & Muscle Tone: within normal limits Gait & Station: normal Patient leans: N/A  Blood pressure 129/86, pulse 93, temperature 98.2 F (36.8 C), last menstrual period 12/17/2018, SpO2 100 %.  Recommendations:  Based on my evaluation the patient does not appear to have an emergency medical condition.  [place], FNP 01/03/2019, 7:09 PM

## 2019-01-03 NOTE — BH Assessment (Addendum)
Assessment Note  Tracy Coleman is a 43 y.o. female who came to [facility] due to ongoing depressive thoughts. Pt shares she has been a marijuana smoker for the past 25 years but states she stopped smoking 1 week ago because she was spending $400/month and because it caused arguments between her and her husband, as it doesn't like for her to do it. Pt states she doesn't feel she can physically hurt herself, though she isn't opposed to something happening to her. She shares her mother died in 02/17/2011, her younger brother died in 2018/03/19, and her grandmother died in 11/18/2018, so she has had to deal with a lot of loss over a short oamuont of time. Pt states the marijuana used to help her with the sleep and anxiety, though she no longer has it to use.  Pt acknowledges she has been experiencing SI; she states she has been thinking about o/d, as this is something she has attempted twice in the past; pt states she last attempted to kill herself in 02/16/09. Pt denies HI, AVH, and NSSIB. She denies any court involvement or access to weapons. She shares she is currently seeing Patriciaann Clan, PA, for medication management, and that she has made an appointment at [facility] with Rollene Fare for January 20, 2019. She shares her appetite has been reduced and that she has been sleeping 3-4 hours/night.  Pt is oriented x4. Her recent and remote memory is intact. Pt was tearful yet pleasant throughout the assessment process. Pt's insight, judgement, and impulse control is impaired at this time.   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 Anxiety   . Arthritis   . Asthma   . Depression   . Hypertension   . Obesity   . Post traumatic stress disorder (PTSD)   . PTSD (post-traumatic stress disorder)   . PTSD (post-traumatic stress  disorder)     Past Surgical History:  Procedure Laterality Date  . ARTHRODESIS METATARSALPHALANGEAL JOINT (MTPJ) Right 04/11/2018   Procedure: ARTHRODESIS METATARSALPHALANGEAL JOINT (MTPJ);  Surgeon: Wylene Simmer, MD;  Location: [facility];  Service: Orthopedics;  Laterality: Right;    Family History:  Family History  Problem Relation Age of Onset  . Alcohol abuse Mother   . Drug abuse Mother   . Depression Mother   . Early death Mother        pneumonia, age 85  . Hypertension Mother   . Alcohol abuse Father   . Drug abuse Father   . Diabetes Maternal Grandmother   . Hypertension Maternal Grandmother   . Diabetes Paternal Grandmother   . Hypertension Paternal Grandmother     Social History:  reports that she has been smoking. She has been smoking about 0.50 packs per day. She has never used smokeless tobacco. She reports current alcohol use of about 2.0 standard drinks of alcohol per week. She reports current drug use. Drug: Marijuana.  Additional Social History:  Alcohol / Drug Use Pain Medications: Please see MAR Prescriptions: Please see MAR Over the Counter: Please see MAR History of alcohol / drug use?: Yes Longest period of sobriety (when/how long): 1 week Substance #1 Name of Substance 1: Marijuana 1 - Age of First Use: Unknown 1 - Amount (size/oz): $100/week 1 - Frequency: Daily 1 - Duration: 25 years 1 - Last Use /  Amount: 1 week ago (quit)  CIWA: CIWA-Ar BP: 129/86 Pulse Rate: 93 COWS:    Allergies:  Allergies  Allergen Reactions  . Varenicline Other (See Comments)    May have contributed to suicidal ideation May have contributed to suicidal ideation    Home Medications:  Medications Prior to Admission  Medication Sig Dispense Refill  . albuterol (PROVENTIL HFA) 108 (90 Base) MCG/ACT inhaler Inhale into the lungs.    Marland Kitchen amLODipine (NORVASC) 5 MG tablet Take 1 tablet (5 mg total) by mouth daily. 90 tablet 3  . buPROPion (WELLBUTRIN XL)  300 MG 24 hr tablet Take 1 tablet (300 mg total) by mouth daily. 90 tablet 0  . cetirizine-pseudoephedrine (ZYRTEC-D) 5-120 MG tablet Take by mouth.    . chlorthalidone (HYGROTON) 25 MG tablet Take 1 tablet (25 mg total) by mouth daily. 90 tablet 3  . citalopram (CELEXA) 40 MG tablet Take 1 tablet (40 mg total) by mouth daily. 90 tablet 0  . docusate sodium (COLACE) 100 MG capsule Take 1 capsule (100 mg total) by mouth 2 (two) times daily. While taking narcotic pain medicine. 30 capsule 0  . famotidine (PEPCID) 20 MG tablet Take 1 tablet (20 mg total) by mouth daily. 90 tablet 1  . levonorgestrel (MIRENA) 20 MCG/24HR IUD by Intrauterine route.    Marland Kitchen lisinopril (PRINIVIL,ZESTRIL) 40 MG tablet Take 1 tablet (40 mg total) by mouth daily. 90 tablet 3  . meloxicam (MOBIC) 7.5 MG tablet Take 1 tablet (7.5 mg total) by mouth daily. 30 tablet 0  . Multiple Vitamin (THERA) TABS Take by mouth.    . prazosin (MINIPRESS) 1 MG capsule Take 1 mg by mouth at bedtime.    . senna (SENOKOT) 8.6 MG TABS tablet Take 2 tablets (17.2 mg total) by mouth 2 (two) times daily. 30 each 0  . traMADol (ULTRAM) 50 MG tablet tramadol 50 mg tablet  TAKE 1 TABLET BY MOUTH EVERY 6 HOURS AS NEEDED FOR 4 DAYS    . traZODone (DESYREL) 100 MG tablet Take 200 mg by mouth.   3    OB/GYN Status:  Patient's last menstrual period was 12/17/2018.  General Assessment Data Location of Assessment: [facility] Assessment Services TTS Assessment: In system Is this a Tele or Face-to-Face Assessment?: Face-to-Face Is this an Initial Assessment or a Re-assessment for this encounter?: Initial Assessment Patient Accompanied by:: N/A Language Other than English: No Living Arrangements: Other (Comment)(Pt lives with her husband and one of her daughters) What gender do you identify as?: Female Marital status: Married Spicer name: UTA Pregnancy Status: No Living Arrangements: Spouse/significant other Can pt return to current living arrangement?:  Yes Admission Status: Voluntary Is patient capable of signing voluntary admission?: Yes Referral Source: Self/Family/Friend Insurance type: Medicaid  Medical Screening Exam ([place]) Medical Exam completed: Yes  Crisis Care Plan Living Arrangements: Spouse/significant other Legal Guardian: (N/A) Name of Psychiatrist: Patriciaann Clan, [place] Name of Therapist: None - scheduled an appointment for February 10([facility] [place])  Education Status Is patient currently in school?: No Is the patient employed, unemployed or receiving disability?: Unemployed  Risk to self with the past 6 months Suicidal Ideation: Yes-Currently Present Has patient been a risk to self within the past 6 months prior to admission? : Yes Suicidal Intent: Yes-Currently Present Has patient had any suicidal intent within the past 6 months prior to admission? : Yes Is patient at risk for suicide?: Yes Suicidal Plan?: Yes-Currently Present Has patient had any suicidal plan within the  past 6 months prior to admission? : Yes Specify Current Suicidal Plan: Pt plans to o/d on her Trazadone Access to Means: Yes Specify Access to Suicidal Means: Pt has a prescription for Trazadone What has been your use of drugs/alcohol within the last 12 months?: Until last week, pt smoked marijuana for 25 years Previous Attempts/Gestures: Yes How many times?: 2 Other Self Harm Risks: None noted Triggers for Past Attempts: Spouse contact, Other (Comment)(Pt lost her younger brother & grandmother last year) Intentional Self Injurious Behavior: None Family Suicide History: Unknown Recent stressful life event(s): Loss (Comment)(Pt's younger brother and grandmother passed last year) Persecutory voices/beliefs?: No Depression: Yes Depression Symptoms: Despondent, Tearfulness, Guilt, Feeling worthless/self pity, Feeling angry/irritable Substance abuse history and/or treatment for substance abuse?: No Suicide prevention information  given to non-admitted patients: Not applicable  Risk to Others within the past 6 months Homicidal Ideation: No Does patient have any lifetime risk of violence toward others beyond the six months prior to admission? : No Thoughts of Harm to Others: No Current Homicidal Intent: No Current Homicidal Plan: No Access to Homicidal Means: No Identified Victim: None noted History of harm to others?: No Assessment of Violence: On admission Violent Behavior Description: None noted Does patient have access to weapons?: No(Pt denies) Criminal Charges Pending?: No Does patient have a court date: No Is patient on probation?: No  Psychosis Hallucinations: None noted Delusions: None noted  Mental Status Report Appearance/Hygiene: Unremarkable Eye Contact: Good Motor Activity: Unremarkable Speech: Logical/coherent Level of Consciousness: Crying Mood: Depressed, Worthless, low self-esteem Affect: Depressed Anxiety Level: Moderate Thought Processes: Coherent, Relevant Judgement: Impaired Orientation: Person, Place, Time, Situation Obsessive Compulsive Thoughts/Behaviors: Minimal  Cognitive Functioning Concentration: Normal Memory: Recent Intact, Remote Intact Is patient IDD: No Insight: Fair Impulse Control: Fair Appetite: Poor Have you had any weight changes? : No Change Sleep: Decreased Total Hours of Sleep: 4 Vegetative Symptoms: None  ADLScreening [facility] Assessment Services) Patient's cognitive ability adequate to safely complete daily activities?: Yes Patient able to express need for assistance with ADLs?: Yes Independently performs ADLs?: Yes (appropriate for developmental age)  Prior Inpatient Therapy Prior Inpatient Therapy: No  Prior Outpatient Therapy Prior Outpatient Therapy: No Does patient have an ACCT team?: No Does patient have Intensive In-House Services?  : No Does patient have [facility] services? : No Does patient have P4CC services?: No  ADL Screening  (condition at time of admission) Patient's cognitive ability adequate to safely complete daily activities?: Yes Is the patient deaf or have difficulty hearing?: No Does the patient have difficulty seeing, even when wearing glasses/contacts?: No Does the patient have difficulty concentrating, remembering, or making decisions?: No Patient able to express need for assistance with ADLs?: Yes Does the patient have difficulty dressing or bathing?: No Independently performs ADLs?: Yes (appropriate for developmental age) Does the patient have difficulty walking or climbing stairs?: No Weakness of Legs: None Weakness of Arms/Hands: None  Home Assistive Devices/Equipment Home Assistive Devices/Equipment: None  Therapy Consults (therapy consults require a physician order) PT Evaluation Needed: No OT Evalulation Needed: No SLP Evaluation Needed: No Abuse/Neglect Assessment (Assessment to be complete while patient is alone) Abuse/Neglect Assessment Can Be Completed: Unable to assess, patient is non-responsive or altered mental status(Pt is upset and unable to discuss at this time) Values / Beliefs Cultural Requests During Hospitalization: None Spiritual Requests During Hospitalization: None Consults Spiritual Care Consult Needed: No Social Work Consult Needed: No Regulatory affairs officer (For Healthcare) Does Patient Have a Medical Advance Directive?: No Would patient like  information on creating a medical advance directive?: No - Patient declined       Disposition: Marvia Pickles, NP reviewed pt's chart and information and met with pt and determined pt meets criteria for inpatient hospitalization. Pt was accepted at [facility] and placed in Room 404-1.   Disposition Initial Assessment Completed for this Encounter: Yes Disposition of Patient: Admit(Travis Money, PA determied pt meets inpatiet hosp criteria) Type of inpatient treatment program: Adult Patient refused recommended treatment:  No Mode of transportation if patient is discharged/movement?: N/A Patient referred to: Other (Comment)(PT was accepted at [facility] Room 404-1)  On Site Evaluation by:   Reviewed with Physician:    Dannielle Burn 01/03/2019 7:16 PM

## 2019-01-04 ENCOUNTER — Other Ambulatory Visit: Payer: Self-pay

## 2019-01-04 ENCOUNTER — Encounter (HOSPITAL_COMMUNITY): Payer: Self-pay

## 2019-01-04 DIAGNOSIS — I10 Essential (primary) hypertension: Secondary | ICD-10-CM

## 2019-01-04 DIAGNOSIS — F322 Major depressive disorder, single episode, severe without psychotic features: Secondary | ICD-10-CM

## 2019-01-04 DIAGNOSIS — G4700 Insomnia, unspecified: Secondary | ICD-10-CM

## 2019-01-04 LAB — COMPREHENSIVE METABOLIC PANEL
ALK PHOS: 55 U/L (ref 38–126)
ALT: 23 U/L (ref 0–44)
AST: 17 U/L (ref 15–41)
Albumin: 3.9 g/dL (ref 3.5–5.0)
Anion gap: 10 (ref 5–15)
BUN: 19 mg/dL (ref 6–20)
CALCIUM: 9.5 mg/dL (ref 8.9–10.3)
CO2: 28 mmol/L (ref 22–32)
Chloride: 99 mmol/L (ref 98–111)
Creatinine, Ser: 0.93 mg/dL (ref 0.44–1.00)
GFR calc Af Amer: >60 mL/min (ref >60)
GFR calc non Af Amer: >60 mL/min (ref >60)
Glucose, Bld: 102 mg/dL — ABNORMAL HIGH (ref 70–99)
Potassium: 3.2 mmol/L — ABNORMAL LOW (ref 3.5–5.1)
Sodium: 137 mmol/L (ref 135–145)
Total Bilirubin: 0.5 mg/dL (ref 0.3–1.2)
Total Protein: 6.9 g/dL (ref 6.5–8.1)

## 2019-01-04 LAB — CBC
HCT: 40.1 % (ref 36.0–46.0)
Hemoglobin: 13.3 g/dL (ref 12.0–15.0)
MCH: 30.9 pg (ref 26.0–34.0)
MCHC: 33.2 g/dL (ref 30.0–36.0)
MCV: 93 fL (ref 80.0–100.0)
Platelets: 414 10*3/uL — ABNORMAL HIGH (ref 150–400)
RBC: 4.31 MIL/uL (ref 3.87–5.11)
RDW: 13.3 % (ref 11.5–15.5)
WBC: 6.3 10*3/uL (ref 4.0–10.5)
nRBC: 0 % (ref 0.0–0.2)

## 2019-01-04 LAB — LIPID PANEL
Cholesterol: 178 mg/dL (ref 0–200)
HDL: 35 mg/dL — ABNORMAL LOW (ref >40)
LDL Cholesterol: 118 mg/dL — ABNORMAL HIGH (ref 0–99)
Total CHOL/HDL Ratio: 5.1 RATIO
Triglycerides: 123 mg/dL (ref <150)
VLDL: 25 mg/dL (ref 0–40)

## 2019-01-04 LAB — TSH: TSH: 3.828 u[IU]/mL (ref 0.350–4.500)

## 2019-01-04 LAB — HEMOGLOBIN A1C
Hgb A1c MFr Bld: 5.9 % — ABNORMAL HIGH (ref 4.8–5.6)
Mean Plasma Glucose: 122.63 mg/dL

## 2019-01-04 LAB — RAPID URINE DRUG SCREEN, HOSP PERFORMED
Amphetamines: NOT DETECTED
Barbiturates: NOT DETECTED
Benzodiazepines: NOT DETECTED
Cocaine: NOT DETECTED
Opiates: NOT DETECTED
Tetrahydrocannabinol: NOT DETECTED

## 2019-01-04 LAB — PREGNANCY, URINE: Preg Test, Ur: NEGATIVE

## 2019-01-04 MED ORDER — POTASSIUM CHLORIDE CRYS ER 20 MEQ PO TBCR
20.0000 meq | EXTENDED_RELEASE_TABLET | Freq: Two times a day (BID) | ORAL | Status: AC
  Administered 2019-01-04 – 2019-01-05 (×3): 20 meq via ORAL
  Filled 2019-01-04 (×4): qty 1

## 2019-01-04 MED ORDER — LISINOPRIL 40 MG PO TABS
40.0000 mg | ORAL_TABLET | Freq: Every day | ORAL | Status: DC
  Filled 2019-01-04: qty 1

## 2019-01-04 MED ORDER — LISINOPRIL 20 MG PO TABS
20.0000 mg | ORAL_TABLET | Freq: Every day | ORAL | Status: DC
  Administered 2019-01-04 – 2019-01-05 (×2): 20 mg via ORAL
  Filled 2019-01-04 (×3): qty 1

## 2019-01-04 MED ORDER — MIRTAZAPINE 15 MG PO TABS
7.5000 mg | ORAL_TABLET | Freq: Once | ORAL | Status: AC
  Administered 2019-01-04: 7.5 mg via ORAL
  Filled 2019-01-04: qty 0.5

## 2019-01-04 MED ORDER — AMLODIPINE BESYLATE 5 MG PO TABS
5.0000 mg | ORAL_TABLET | Freq: Every day | ORAL | Status: DC
  Administered 2019-01-04 – 2019-01-06 (×3): 5 mg via ORAL
  Filled 2019-01-04 (×5): qty 1

## 2019-01-04 MED ORDER — CHLORTHALIDONE 25 MG PO TABS
25.0000 mg | ORAL_TABLET | Freq: Every day | ORAL | Status: DC
  Filled 2019-01-04: qty 1

## 2019-01-04 MED ORDER — VENLAFAXINE HCL ER 37.5 MG PO CP24
37.5000 mg | ORAL_CAPSULE | Freq: Every day | ORAL | Status: DC
  Administered 2019-01-05 – 2019-01-06 (×2): 37.5 mg via ORAL
  Filled 2019-01-04 (×5): qty 1

## 2019-01-04 MED ORDER — MIRTAZAPINE 7.5 MG PO TABS
7.5000 mg | ORAL_TABLET | Freq: Every day | ORAL | Status: DC
  Administered 2019-01-04 – 2019-01-05 (×2): 7.5 mg via ORAL
  Filled 2019-01-04 (×5): qty 1

## 2019-01-04 NOTE — Progress Notes (Signed)
D: Pt denies SI/HI/AVH. Pt is pleasant and cooperative. Pt stated because of how the situation was handled earlier , pt does not feel good about being here. Pt stated she did not feel comftortable being behind locked doors and being the only woman back here. Pt was encouraged to get the most out of her stay here and not to let a misfortunate incident cause her to not get the most out of her Tx. Pt was really upset because she felt she was being targeted due to one person being allowed to use something that was told to her that she could have also , but later told she could not.  A: Pt was offered support and encouragement. Pt was given scheduled medications. Pt was encourage to attend groups. Q 15 minute checks were done for safety.  R:Pt attends groups and interacts  with peers and staff. Pt is taking medication .Pt receptive to treatment and safety maintained on unit.  Problem: Education: Goal: Knowledge of the prescribed therapeutic regimen will improve Outcome: Progressing   Problem: Activity: Goal: Interest or engagement in leisure activities will improve Outcome: Progressing

## 2019-01-04 NOTE — BHH Group Notes (Signed)
LCSW Group Therapy Note  01/04/2019   10:00-11:00am   Type of Therapy and Topic:  Group Therapy: Anger Cues and Responses  Participation Level:  Active   Description of Group:   In this group, patients learned how to recognize the physical, cognitive, emotional, and behavioral responses they have to anger-provoking situations.  They identified a recent time they became angry and how they reacted.  They analyzed how their reaction was possibly beneficial and how it was possibly unhelpful.  The group discussed a variety of healthier coping skills that could help with such a situation in the future.  Deep breathing was practiced briefly.  Therapeutic Goals: 1. Patients will remember their last incident of anger and how they felt emotionally and physically, what their thoughts were at the time, and how they behaved. 2. Patients will identify how their behavior at that time worked for them, as well as how it worked against them. 3. Patients will explore possible new behaviors to use in future anger situations. 4. Patients will learn that anger itself is normal and cannot be eliminated, and that healthier reactions can assist with resolving conflict rather than worsening situations.  Summary of Patient Progress:  Patient was late to group after meeting with the MD. Patient was present and engaged well in discussion. Patient was open and honest sharing about her grief and her anger with stories. Patient identified her triggers to be not being understood, not able to share how she feels and self-centered people. Patient reports her warning signs for anger are headaches and dizzy feelings. Patient reports her plan upon discharge is to try to listen more without being defensive.   Therapeutic Modalities:   Cognitive Behavioral Therapy  Tye Savoy

## 2019-01-04 NOTE — H&P (Addendum)
Psychiatric Admission Assessment Adult  Patient Identification: Tracy Coleman MRN:  [id number] Date of Evaluation:  01/04/2019 Chief Complaint:  MDD Principal Diagnosis: MDD (major depressive disorder), severe ([place]) Diagnosis:  Active Problems:   MDD (major depressive disorder), severe ([place])  History of Present Illness: Tracy Coleman is a 5, married female, she has two children, ages 44,15, currently with stepfather. Unemployed. She presented to ED voluntarily, reporting worsening depression, poor sleep, poor appetite, low energy level, and anhedonia. She expressed passive suicidal ideations. She does not exhibit psychotic symptoms. She names contributing stressors as the death of grandmother and a brother last year.  States she had been smoking cannabis daily up to about a week ago, as it caused marital discord and was negatively affecting her financial situation. Her admission UDS was negative. She stated she has not smoked for the past week.  She also reports a past history of alcohol use and was drinking a bottle of wine per day, she has not had any alcohol for three weeks.   She has a history of a prior psychiatric admission in 2010, for depression, suicidal ideations, which she states was associated with being on Chantix at the time, denies history of mania, hypomania. Denies history of psychosis.   Reports prior Paxil trial which she does not feel worked .   Medical History - HTN.  NKDA. Denies history of seizures or of head trauma. Home medications - Amlodipine 5 mgrs QDAY, Chlorthalidone 25 mgrs QDAY, Lisinopril 40 mgrs QDAY. Wellbutrin XL 300 mgrs QDAY ( x 2 years) , Celexa 40 mgrs ( for several years ), Trazodone 200 mgrs QHS .    Associated Signs/Symptoms: Depression Symptoms:  depressed mood, anhedonia, fatigue, suicidal thoughts without plan, loss of energy/fatigue, (Hypo) Manic Symptoms:  N/A Anxiety Symptoms:  N/A Psychotic Symptoms:  N/A PTSD Symptoms: N/A Total  Time spent with patient: 20 minutes  Past Psychiatric History: As above  Is the patient at risk to self? Yes.    Has the patient been a risk to self in the past 6 months? Yes.    Has the patient been a risk to self within the distant past? No.  Is the patient a risk to others? No.  Has the patient been a risk to others in the past 6 months? No.  Has the patient been a risk to others within the distant past? No.   Prior Inpatient Therapy: Prior Inpatient Therapy: No Prior Outpatient Therapy: Prior Outpatient Therapy: No Does patient have an ACCT team?: No Does patient have Intensive In-House Services?  : No Does patient have [facility] services? : No Does patient have P4CC services?: No  Alcohol Screening: 1. How often do you have a drink containing alcohol?: 4 or more times a week 2. How many drinks containing alcohol do you have on a typical day when you are drinking?: 10 or more 3. How often do you have six or more drinks on one occasion?: Daily or almost daily AUDIT-C Score: 12 4. How often during the last year have you found that you were not able to stop drinking once you had started?: Daily or almost daily 5. How often during the last year have you failed to do what was normally expected from you becasue of drinking?: Never 6. How often during the last year have you needed a first drink in the morning to get yourself going after a heavy drinking session?: Daily or almost daily 7. How often during the last year have you had a  feeling of guilt of remorse after drinking?: Less than monthly 8. How often during the last year have you been unable to remember what happened the night before because you had been drinking?: Weekly 9. Have you or someone else been injured as a result of your drinking?: No 10. Has a relative or friend or a doctor or another health worker been concerned about your drinking or suggested you cut down?: Yes, during the last year Alcohol Use Disorder Identification  Test Final Score (AUDIT): 28 Alcohol Brief Interventions/Follow-up: Brief Advice Substance Abuse History in the last 12 months:  Yes.   Consequences of Substance Abuse: Family Consequences:  financial problems Previous Psychotropic Medications: No  Psychological Evaluations: unknown Past Medical History:  Past Medical History:  Diagnosis Date  . Anxiety   . Arthritis   . Asthma   . Depression   . Hypertension   . Obesity   . Post traumatic stress disorder (PTSD)   . PTSD (post-traumatic stress disorder)   . PTSD (post-traumatic stress disorder)     Past Surgical History:  Procedure Laterality Date  . ARTHRODESIS METATARSALPHALANGEAL JOINT (MTPJ) Right 04/11/2018   Procedure: ARTHRODESIS METATARSALPHALANGEAL JOINT (MTPJ);  Surgeon: Wylene Simmer, MD;  Location: [facility];  Service: Orthopedics;  Laterality: Right;   Family History:  Family History  Problem Relation Age of Onset  . Alcohol abuse Mother   . Drug abuse Mother   . Depression Mother   . Early death Mother        pneumonia, age 20  . Hypertension Mother   . Alcohol abuse Father   . Drug abuse Father   . Diabetes Maternal Grandmother   . Hypertension Maternal Grandmother   . Diabetes Paternal Grandmother   . Hypertension Paternal Grandmother    Family Psychiatric  History: Pt did not give this information Tobacco Screening: Have you used any form of tobacco in the last 30 days? (Cigarettes, Smokeless Tobacco, Cigars, and/or Pipes): Yes Tobacco use, Select all that apply: 5 or more cigarettes per day Are you interested in Tobacco Cessation Medications?: Yes, will notify MD for an order Counseled patient on smoking cessation including recognizing danger situations, developing coping skills and basic information about quitting provided: Yes Social History:  Social History   Substance and Sexual Activity  Alcohol Use Yes  . Alcohol/week: 2.0 standard drinks  . Types: 2 Glasses of wine per week  .  Frequency: Never   Comment: occassional     Social History   Substance and Sexual Activity  Drug Use Yes  . Types: Marijuana   Comment: last 04-10-18 (nearly daily for anxiety)    Additional Social History: Marital status: Married Number of Years Married: 2 Additional relationship information: Since Aug 2018 Are you sexually active?: Yes Does patient have children?: Yes How many children?: 2 How is patient's relationship with their children?: Daughters - were close    Pain Medications: Please see MAR Prescriptions: Please see MAR Over the Counter: Please see MAR History of alcohol / drug use?: Yes Longest period of sobriety (when/how long): 1 week Name of Substance 1: Marijuana 1 - Age of First Use: Unknown 1 - Amount (size/oz): $100/week 1 - Frequency: Daily 1 - Duration: 25 years 1 - Last Use / Amount: 1 week ago (quit)                  Allergies:   Allergies  Allergen Reactions  . Varenicline Other (See Comments)    May have  contributed to suicidal ideation May have contributed to suicidal ideation   Lab Results:  Results for orders placed or performed during the hospital encounter of 01/03/19 (from the past 48 hour(s))  Pregnancy, urine     Status: None   Collection Time: 01/03/19 11:28 PM  Result Value Ref Range   Preg Test, Ur NEGATIVE NEGATIVE    Comment:        THE SENSITIVITY OF THIS METHODOLOGY IS >20 mIU/mL. Performed at [facility], [place] [address] [place], [place] [address]   Urine rapid drug screen (hosp performed)not at [facility]     Status: None   Collection Time: 01/03/19 11:28 PM  Result Value Ref Range   Opiates NONE DETECTED NONE DETECTED   Cocaine NONE DETECTED NONE DETECTED   Benzodiazepines NONE DETECTED NONE DETECTED   Amphetamines NONE DETECTED NONE DETECTED   Tetrahydrocannabinol NONE DETECTED NONE DETECTED   Barbiturates NONE DETECTED NONE DETECTED    Comment: (NOTE) DRUG SCREEN FOR MEDICAL PURPOSES ONLY.  IF  CONFIRMATION IS NEEDED FOR ANY PURPOSE, NOTIFY LAB WITHIN 5 DAYS. LOWEST DETECTABLE LIMITS FOR URINE DRUG SCREEN Drug Class                     Cutoff (ng/mL) Amphetamine and metabolites    1000 Barbiturate and metabolites    200 Benzodiazepine                 604 Tricyclics and metabolites     300 Opiates and metabolites        300 Cocaine and metabolites        300 THC                            50 Performed at [facility], [place] [address], [place], [place] [address]   CBC     Status: Abnormal   Collection Time: 01/04/19  6:36 AM  Result Value Ref Range   WBC 6.3 4.0 - 10.5 K/uL   RBC 4.31 3.87 - 5.11 MIL/uL   Hemoglobin 13.3 12.0 - 15.0 g/dL   HCT 40.1 36.0 - 46.0 %   MCV 93.0 80.0 - 100.0 fL   MCH 30.9 26.0 - 34.0 pg   MCHC 33.2 30.0 - 36.0 g/dL   RDW 13.3 11.5 - 15.5 %   Platelets 414 (H) 150 - 400 K/uL   nRBC 0.0 0.0 - 0.2 %    Comment: Performed at [facility], [place] [address] [place], [place] [address]  Comprehensive metabolic panel     Status: Abnormal   Collection Time: 01/04/19  6:36 AM  Result Value Ref Range   Sodium 137 135 - 145 mmol/L   Potassium 3.2 (L) 3.5 - 5.1 mmol/L   Chloride 99 98 - 111 mmol/L   CO2 28 22 - 32 mmol/L   Glucose, Bld 102 (H) 70 - 99 mg/dL   BUN 19 6 - 20 mg/dL   Creatinine, Ser 0.93 0.44 - 1.00 mg/dL   Calcium 9.5 8.9 - 10.3 mg/dL   Total Protein 6.9 6.5 - 8.1 g/dL   Albumin 3.9 3.5 - 5.0 g/dL   AST 17 15 - 41 U/L   ALT 23 0 - 44 U/L   Alkaline Phosphatase 55 38 - 126 U/L   Total Bilirubin 0.5 0.3 - 1.2 mg/dL   GFR calc non Af Amer >60 >60 mL/min   GFR calc Af Amer >  60 >60 mL/min   Anion gap 10 5 - 15    Comment: Performed at [facility], [place] [address], [place], [place] [address]  Hemoglobin A1c     Status: Abnormal   Collection Time: 01/04/19  6:36 AM  Result Value Ref Range   Hgb A1c MFr Bld 5.9 (H) 4.8 - 5.6 %    Comment: (NOTE) Pre diabetes:           5.7%-6.4% Diabetes:              >6.4% Glycemic control for   <7.0% adults with diabetes    Mean Plasma Glucose 122.63 mg/dL    Comment: Performed at [facility] [address], [place], [place] [address]  Lipid panel     Status: Abnormal   Collection Time: 01/04/19  6:36 AM  Result Value Ref Range   Cholesterol 178 0 - 200 mg/dL   Triglycerides 123 <150 mg/dL   HDL 35 (L) >40 mg/dL   Total CHOL/HDL Ratio 5.1 RATIO   VLDL 25 0 - 40 mg/dL   LDL Cholesterol 118 (H) 0 - 99 mg/dL    Comment:        Total Cholesterol/HDL:CHD Risk Coronary Heart Disease Risk Table                     Men   Women  1/2 Average Risk   3.4   3.3  Average Risk       5.0   4.4  2 X Average Risk   9.6   7.1  3 X Average Risk  23.4   11.0        Use the calculated Patient Ratio above and the CHD Risk Table to determine the patient's CHD Risk.        ATP III CLASSIFICATION (LDL):  <100     mg/dL   Optimal  100-129  mg/dL   Near or Above                    Optimal  130-159  mg/dL   Borderline  160-189  mg/dL   High  >190     mg/dL   Very High Performed at [facility] [address], [place], [place] [address]   TSH     Status: None   Collection Time: 01/04/19  6:36 AM  Result Value Ref Range   TSH 3.828 0.350 - 4.500 uIU/mL    Comment: Performed by a 3rd Generation assay with a functional sensitivity of <=0.01 uIU/mL. Performed at [facility], [place] [address] [place],  [address]     Blood Alcohol level:  No results found for: [facility]  Metabolic Disorder Labs:  Lab Results  Component Value Date   HGBA1C 5.9 (H) 01/04/2019   MPG 122.63 01/04/2019   No results found for: PROLACTIN Lab Results  Component Value Date   CHOL 178 01/04/2019   TRIG 123 01/04/2019   HDL 35 (L) 01/04/2019   CHOLHDL 5.1 01/04/2019   VLDL 25 01/04/2019   LDLCALC 118 (H) 01/04/2019    Current Medications: Current Facility-Administered Medications  Medication  Dose Route Frequency Provider Last Rate Last Dose  . acetaminophen (TYLENOL) tablet 650 mg  650 mg Oral Q6H PRN Money, Lowry Ram, FNP      . alum & mag hydroxide-simeth (MAALOX/MYLANTA) 200-200-20 MG/5ML suspension 30 mL  30 mL Oral Q4H PRN Money, Lowry Ram, FNP      .  amLODipine (NORVASC) tablet 5 mg  5 mg Oral Daily , Myer Peer, MD   5 mg at 01/04/19 1131  . lisinopril (PRINIVIL,ZESTRIL) tablet 20 mg  20 mg Oral Daily Caren Griffins, MD   20 mg at 01/04/19 1131  . magnesium hydroxide (MILK OF MAGNESIA) suspension 30 mL  30 mL Oral Daily PRN Money, Lowry Ram, FNP      . mirtazapine (REMERON) tablet 7.5 mg  7.5 mg Oral QHS ,  A, MD      . potassium chloride SA (K-DUR,KLOR-CON) CR tablet 20 mEq  20 mEq Oral BID , Myer Peer, MD   20 mEq at 01/04/19 1130  . [START ON 01/05/2019] venlafaxine XR (EFFEXOR-XR) 24 hr capsule 37.5 mg  37.5 mg Oral Q breakfast , Myer Peer, MD       PTA Medications: Medications Prior to Admission  Medication Sig Dispense Refill Last Dose  . albuterol (PROVENTIL HFA) 108 (90 Base) MCG/ACT inhaler Inhale into the lungs.     Marland Kitchen amLODipine (NORVASC) 5 MG tablet Take 1 tablet (5 mg total) by mouth daily. 90 tablet 3   . buPROPion (WELLBUTRIN XL) 300 MG 24 hr tablet Take 1 tablet (300 mg total) by mouth daily. 90 tablet 0   . cetirizine-pseudoephedrine (ZYRTEC-D) 5-120 MG tablet Take by mouth.     . chlorthalidone (HYGROTON) 25 MG tablet Take 1 tablet (25 mg total) by mouth daily. 90 tablet 3   . citalopram (CELEXA) 40 MG tablet Take 1 tablet (40 mg total) by mouth daily. 90 tablet 0   . docusate sodium (COLACE) 100 MG capsule Take 1 capsule (100 mg total) by mouth 2 (two) times daily. While taking narcotic pain medicine. 30 capsule 0   . famotidine (PEPCID) 20 MG tablet Take 1 tablet (20 mg total) by mouth daily. 90 tablet 1   . levonorgestrel (MIRENA) 20 MCG/24HR IUD by Intrauterine route.   More than a month at Unknown time  . lisinopril  (PRINIVIL,ZESTRIL) 40 MG tablet Take 1 tablet (40 mg total) by mouth daily. 90 tablet 3   . meloxicam (MOBIC) 7.5 MG tablet Take 1 tablet (7.5 mg total) by mouth daily. 30 tablet 0   . Multiple Vitamin (THERA) TABS Take by mouth.   More than a month at Unknown time  . prazosin (MINIPRESS) 1 MG capsule Take 1 mg by mouth at bedtime.   More than a month at Unknown time  . senna (SENOKOT) 8.6 MG TABS tablet Take 2 tablets (17.2 mg total) by mouth 2 (two) times daily. 30 each 0   . traMADol (ULTRAM) 50 MG tablet tramadol 50 mg tablet  TAKE 1 TABLET BY MOUTH EVERY 6 HOURS AS NEEDED FOR 4 DAYS     . traZODone (DESYREL) 100 MG tablet Take 200 mg by mouth.   3 Past Week at Unknown time    Musculoskeletal: Strength & Muscle Tone: within normal limits Gait & Station: normal Patient leans: N/A  Psychiatric Specialty Exam: Physical Exam  ROS headache, no chest pain, no shortness of breath, no vomiting , no fever, no chills  Blood pressure 111/86, pulse 82, temperature 97.8 F (36.6 C), temperature source Oral, resp. rate 20, height 5' 5" (1.651 m), weight 115.2 kg, last menstrual period 12/17/2018, SpO2 100 %.Body mass index is 42.27 kg/m.  General Appearance: Fairly Groomed  Eye Contact:  Fair  Speech:  Normal Rate  Volume:  Normal  Mood:  Depressed  Affect:  constricted, sad , describes mood  as 1-2/10 with 10 being best   Thought Process:  Linear and Descriptions of Associations: Intact  Orientation:  Other:  fully alert and attentive   Thought Content:  no hallucinations, no delusions, not internally preoccupied   Suicidal Thoughts:  No denies any suicidal or self injurious ideations at this time, contracts for safety on unit  Homicidal Thoughts:  No  Memory:  recent and remote grossly intact  Judgement:  Fair  Insight:  Fair  Psychomotor Activity:  Normal  Concentration:  Concentration: Good and Attention Span: Good  Recall:  Good  Fund of Knowledge:  Good  Language:  Good  Akathisia:   Negative  Handed:  Right  AIMS (if indicated):     Assets:  Communication Skills Desire for Improvement Resilience  ADL's:  Intact  Cognition:  WNL  Sleep:  Number of Hours: 5.25      Treatment Plan Summary: Daily contact with patient to assess and evaluate symptoms and progress in treatment and Medication management  Attend Group therapy Participate in the therapeutic milieu Medication management: Start Effexor XR 37.5 mgrs QDAY Start Remeron 7.5 mgrs QHS KDUR supplementation for hypokalemia Currently on Amlodipine , Lisinopril, Chlorthalidone- BP today 111/86. K+ 3.2. Will review antihypertensive management with hospitalist for reccomendations  Observation Level/Precautions:  Elopement  Laboratory:  Labs reviewed  Psychotherapy:  Attend group therapy  Medications:  See below  Consultations:  TBD  Discharge Concerns:  Safety   Estimated LOS:  Other:     Physician Treatment Plan for Primary Diagnosis: MDD (major depressive disorder), severe ([place]) Long Term Goal(s): Improvement in symptoms so as ready for discharge  Short Term Goals: Ability to identify changes in lifestyle to reduce recurrence of condition will improve, Ability to verbalize feelings will improve, Ability to disclose and discuss suicidal ideas, Ability to demonstrate self-control will improve, Ability to identify and develop effective coping behaviors will improve, Compliance with prescribed medications will improve and Ability to identify triggers associated with substance abuse/mental health issues will improve  Physician Treatment Plan for Secondary Diagnosis: Active Problems:   MDD (major depressive disorder), severe ([place])  Long Term Goal(s): Improvement in symptoms so as ready for discharge  Short Term Goals: Ability to identify changes in lifestyle to reduce recurrence of condition will improve, Ability to verbalize feelings will improve, Ability to disclose and discuss suicidal ideas, Ability to  demonstrate self-control will improve, Ability to identify and develop effective coping behaviors will improve, Ability to maintain clinical measurements within normal limits will improve, Compliance with prescribed medications will improve and Ability to identify triggers associated with substance abuse/mental health issues will improve  I certify that inpatient services furnished can reasonably be expected to improve the patient's condition.    Ethelene Hal, NP 1/25/202012:45 PM  I have discussed case with NP and have met with patient  Agree with NP note and assessment I have discussed case with NP and have met with patient  Agree with NP note and assessment  53, married, has two children, ages 78,15, currently with stepfather. Unemployed. Presented to ED voluntarily, reporting worsening depression, neuro-vegetative symptoms ( poor sleep, poor appetite, low energy level, anhedonia) , passive suicidal ideations. Denies psychotic symptoms. Contributing stressors include death of grandmother and a brother last year,  States she had been smoking cannabis daily up to about a week ago, as it caused marital discord and was negatively affecting her financial situation.  Admission UDS negative  History of a prior psychiatric admission in 2010, for  depression, suicidal ideations, which she states was associated with being on Chantix at the time, denies history of mania, hypomania. Denies history of psychosis.  Reports prior Paxil trial which she does not feel worked .  Reports past history of alcohol abuse, and had been drinking up to a bottle of wine per day, but states she has not consumed any alcohol x 3 weeks. Endorses Cannabis Abuse, stopped a week ago.  Medical History - HTN. NKDA, reports history of suicidal ideations on Chantix in the past . Denies history of seizures or of head trauma. Home medications - Amlodipine 5 mgrs QDAY, Chlorthalidone 25 mgrs QDAY, Lisinopril 40 mgrs QDAY.  Wellbutrin XL 300 mgrs QDAY ( x 2 years) , Celexa 40 mgrs ( for several years ), Trazodone 200 mgrs QHS .   Dx- MDD. Cannabis Use Disorder .  Plan- Inpatient admission.  We discussed medication options - patient states she does not feel Celexa/Wellbutrin are working , states " I am feeling worse even though I take them".  Will discontinue Wellbutrin, Celexa - agrees to Effexor XR , Remeron trial. Side effect discussed  Start Effexor XR 37.5 mgrs QDAY Start Remeron 7.5 mgrs QHS KDUR supplementation for hypokalemia Currently on Amlodipine , Lisinopril, Chlorthalidone- BP today 111/86. K+ 3.2. Will review antihypertensive management with hospitalist for reccomendations
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
## 2019-01-04 NOTE — Progress Notes (Signed)
Called by Dr. Parke Poisson regarding Mrs. Tracy Coleman, 43 year old female admitted to behavioral health with depression.  She has a history of hypertension and is currently on lisinopril, amlodipine, chlorthalidone.  Her potassium is slightly low at 3.2 and her blood pressure is normal.  Recommend to replete potassium as already done, recheck labs tomorrow morning.  Can hold chlorthalidone, continue amlodipine and lisinopril at lower dose.  Continue to monitor blood pressure and can increase lisinopril back to her home dose if it starts to be elevated  Please call with additional questions   Tracy Piatek M. Cruzita Lederer, MD, PhD [facility]  Contact via  [email or web address]  [place] P: [phone]  F: [phone]

## 2019-01-04 NOTE — Plan of Care (Signed)
  Problem: Education: Goal: Utilization of techniques to improve thought processes will improve Outcome: Progressing   
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
## 2019-01-04 NOTE — Progress Notes (Signed)
I took over care of the patient and have reviewed the chart. I agree with the previous RN assessment.

## 2019-01-04 NOTE — Tx Team (Signed)
Initial Treatment Plan 01/04/2019 12:46 AM Ralene Cork BRA:[id number]    PATIENT STRESSORS: Marital or family conflict Medication change or noncompliance Substance abuse Traumatic event   PATIENT STRENGTHS: Ability for insight Capable of independent living Communication skills Motivation for treatment/growth Supportive family/friends   PATIENT IDENTIFIED PROBLEMS: Substance abuse  Depression  "work on my anxiety'  "grief counseling"                DISCHARGE CRITERIA:  Ability to meet basic life and health needs Adequate post-discharge living arrangements Improved stabilization in mood, thinking, and/or behavior Medical problems require only outpatient monitoring Motivation to continue treatment in a less acute level of care  PRELIMINARY DISCHARGE PLAN: Attend aftercare/continuing care group Attend PHP/IOP Attend 12-step recovery group Return to previous living arrangement Return to previous work or school arrangements  PATIENT/FAMILY INVOLVEMENT: This treatment plan has been presented to and reviewed with the patient, Tracy Coleman, and/or family member.  The patient and family have been given the opportunity to ask questions and make suggestions.  Wolfgang Phoenix, RN 01/04/2019, 12:46 AM

## 2019-01-04 NOTE — Progress Notes (Signed)
D Pt is observed standing at the nurses' station this am at change of shift. She presents with a flat depressed affect. Her speech is slow and monotone. She wears hospital-issued patient scrubs , she is concerned about " getting my meds" and she has questions for this writer about the scheduled for the day. Writer oriented her to schedule, answering quesitons and xplaining programming for the unit.    A Patient completed her daily assessment and on this she wrote she denies having suicidal ideation today and she rated her feelings of depression, hopelessness and axneity " 10/8/9", respectively. She attended her am LCSW group as planned.     R Patient met with Dr Parke Poisson and meds scheduled. Safety is in place.

## 2019-01-04 NOTE — BHH Suicide Risk Assessment (Addendum)
[facility] Admission Suicide Risk Assessment   Nursing information obtained from:  Patient Demographic factors:  NA Current Mental Status:  NA Loss Factors:  Loss of significant relationship Historical Factors:  Impulsivity Risk Reduction Factors:  Living with another person, especially a relative, Responsible for children under 44 years of age  Total Time spent with patient: 45 minutes Principal Problem: MDD, Cannabis Use Disorder  Diagnosis:  Active Problems:   MDD (major depressive disorder), severe (HCC)  Subjective Data:   Continued Clinical Symptoms:  Alcohol Use Disorder Identification Test Final Score (AUDIT): 28 The "Alcohol Use Disorders Identification Test", Guidelines for Use in Primary Care, Second Edition.  World Pharmacologist [facility]). Score between 0-7:  no or low risk or alcohol related problems. Score between 8-15:  moderate risk of alcohol related problems. Score between 16-19:  high risk of alcohol related problems. Score 20 or above:  warrants further diagnostic evaluation for alcohol dependence and treatment.   CLINICAL FACTORS:  44, married, has two children, ages 26,15, currently with stepfather. Unemployed. Presented to ED voluntarily, reporting worsening depression, neuro-vegetative symptoms ( poor sleep, poor appetite, low energy level, anhedonia) , passive suicidal ideations. Denies psychotic symptoms. Contributing stressors include death of grandmother and a brother last year,  States she had been smoking cannabis daily up to about a week ago, as it caused marital discord and was negatively affecting her financial situation.  Admission UDS negative  History of a prior psychiatric admission in 2010, for depression, suicidal ideations, which she states was associated with being on Chantix at the time, denies history of mania, hypomania. Denies history of psychosis.  Reports prior Paxil trial which she does not feel worked .  Reports past history of alcohol  abuse, and had been drinking up to a bottle of wine per day, but states she has not consumed any alcohol x 3 weeks. Endorses Cannabis Abuse, stopped a week ago.  Medical History - HTN. NKDA, reports history of suicidal ideations on Chantix in the past . Denies history of seizures or of head trauma. Home medications - Amlodipine 5 mgrs QDAY, Chlorthalidone 25 mgrs QDAY, Lisinopril 40 mgrs QDAY. Wellbutrin XL 300 mgrs QDAY ( x 2 years) , Celexa 40 mgrs ( for several years ), Trazodone 200 mgrs QHS .   Dx- MDD. Cannabis Use Disorder .  Plan- Inpatient admission.  We discussed medication options - patient states she does not feel Celexa/Wellbutrin are working , states " I am feeling worse even though I take them".  Will discontinue Wellbutrin, Celexa - agrees to Effexor XR , Remeron trial. Side effect discussed  Start Effexor XR 37.5 mgrs QDAY Start Remeron 7.5 mgrs QHS KDUR supplementation for hypokalemia Currently on Amlodipine , Lisinopril, Chlorthalidone- BP today 111/86. K+ 3.2. Will review antihypertensive management with hospitalist for reccomendations      Musculoskeletal: Strength & Muscle Tone: within normal limits Gait & Station: normal Patient leans: N/A  Psychiatric Specialty Exam: Physical Exam  ROS headache, no chest pain, no shortness of breath, no vomiting , no fever, no chills  Blood pressure 111/86, pulse 82, temperature 97.8 F (36.6 C), temperature source Oral, resp. rate 20, height 5\' 5"  (1.651 m), weight 115.2 kg, last menstrual period 12/17/2018, SpO2 100 %.Body mass index is 42.27 kg/m.  General Appearance: Fairly Groomed  Eye Contact:  Fair  Speech:  Normal Rate  Volume:  Normal  Mood:  Depressed  Affect:  constricted, sad , describes mood as 1-2/10 with 10 being best  Thought Process:  Linear and Descriptions of Associations: Intact  Orientation:  Other:  fully alert and attentive   Thought Content:  no hallucinations, no delusions, not internally  preoccupied   Suicidal Thoughts:  No denies any suicidal or self injurious ideations at this time, contracts for safety on unit  Homicidal Thoughts:  No  Memory:  recent and remote grossly intact  Judgement:  Fair  Insight:  Fair  Psychomotor Activity:  Normal  Concentration:  Concentration: Good and Attention Span: Good  Recall:  Good  Fund of Knowledge:  Good  Language:  Good  Akathisia:  Negative  Handed:  Right  AIMS (if indicated):     Assets:  Communication Skills Desire for Improvement Resilience  ADL's:  Intact  Cognition:  WNL  Sleep:  Number of Hours: 5.25      COGNITIVE FEATURES THAT CONTRIBUTE TO RISK:  Closed-mindedness and Loss of executive function    SUICIDE RISK:   Moderate:  Frequent suicidal ideation with limited intensity, and duration, some specificity in terms of plans, no associated intent, good self-control, limited dysphoria/symptomatology, some risk factors present, and identifiable protective factors, including available and accessible social support.  PLAN OF CARE: Patient will be admitted to inpatient psychiatric unit for stabilization and safety. Will provide and encourage milieu participation. Provide medication management and maked adjustments as needed.  Will follow daily.    I certify that inpatient services furnished can reasonably be expected to improve the patient's condition.   Jenne Campus, MD 01/04/2019, 9:49 AM
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
## 2019-01-04 NOTE — BH Assessment (Addendum)
Patient requests head scarf from locker. Scarf in locker approx. Six feet in length, explained to patient scarf not appropriate for unit related to safety. Patient verbally aggressive and threatening toward peer, "I will snatch that wrap off her head!" Attempted to de escalate patient, patient yelling "I don't want to hear this shit! I will have my scarf or I will snatch hers off." Patient continues to threaten both staff and peers. Tracy Coleman moved to 500 hall with unit restriction order at this time, patient verbalizes understanding. MD Cobos aware. Addendum: Patient did walk in an aggressive manner toward peer on 01/04/2019, while yelling "I will snatch her head off."

## 2019-01-05 MED ORDER — LISINOPRIL 20 MG PO TABS: 20.0000 mg | ORAL_TABLET | Freq: Every day | ORAL | Status: DC

## 2019-01-05 MED ORDER — LISINOPRIL 20 MG PO TABS
40.0000 mg | ORAL_TABLET | Freq: Every day | ORAL | Status: DC
  Administered 2019-01-06: 40 mg via ORAL
  Filled 2019-01-05 (×2): qty 1
  Filled 2019-01-05: qty 2
  Filled 2019-01-05: qty 1
  Filled 2019-01-05: qty 2

## 2019-01-05 MED ORDER — LISINOPRIL 40 MG PO TABS: 40.0000 mg | ORAL_TABLET | Freq: Every day | ORAL | Status: DC

## 2019-01-05 MED ORDER — MIRTAZAPINE 15 MG PO TABS
7.5000 mg | ORAL_TABLET | Freq: Once | ORAL | Status: AC
  Administered 2019-01-05: 7.5 mg via ORAL
  Filled 2019-01-05: qty 0.5

## 2019-01-05 NOTE — BHH Group Notes (Signed)
Adult Psychoeducational Group Note  Date:  01/05/2019 Time:  9:32 PM  Group Topic/Focus:  Wrap-Up Group:   The focus of this group is to help patients review their daily goal of treatment and discuss progress on daily workbooks.  Participation Level:  Active  Participation Quality:  Appropriate and Attentive  Affect:  Appropriate  Cognitive:  Alert and Appropriate  Insight: Appropriate and Good  Engagement in Group:  Engaged  Modes of Intervention:  Discussion and Education  Additional Comments:  Pt attended and participated in wrap up group this evening. Pt rated their day a 10/10, due to them being discharged tomorrow. Pt completed their goal, which was to be happy.   Tracy Coleman 01/05/2019, 9:32 PM
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
## 2019-01-05 NOTE — BHH Counselor (Signed)
CSW met with patient and provided her with information on grief counseling in [place]. CSW provided active and empathetic listening as patient described the loss of her younger brother and the death of her  (my abuela) grandmother. The patient described not prioritizing her own needs and having difficulties saying no. She was encouraged to focus on herself and make her mental health needs a priority. This includes following through with grief counseling. The patient expressed willingness to set boundaries and to prioritize her own personal needs moving forward.

## 2019-01-05 NOTE — BHH Group Notes (Signed)
[facility] Group Notes:  (Nursing)  Date:  01/05/2019  Time: 200 PM Type of Therapy:  Nurse Education  Participation Level:  Active  Participation Quality:  Appropriate  Affect:  Appropriate  Cognitive:  Appropriate  Insight:  Appropriate  Engagement in Group:  Engaged  Modes of Intervention:  Discussion and Education  Summary of Progress/Problems:  Life skills group- anger management  Waymond Cera 01/05/2019, 8:07 PM

## 2019-01-05 NOTE — BHH Group Notes (Signed)
[facility] Group Notes:  (Nursing/MHT/Case Management/Adjunct)  Date:  01/05/2019  Time:  11:26 AM  Type of Therapy:  Goals/Orientation Group.  Participation Level:  Active  Participation Quality:  Appropriate  Affect:  Appropriate  Cognitive:  Appropriate  Insight:  Appropriate  Engagement in Group:  Engaged  Modes of Intervention:  Discussion  Summary of Progress/Problems: Pt attended goals/orientation- healthy support system group, pt was receptive.  Tracy Coleman 01/05/2019, 11:26 AM
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
## 2019-01-05 NOTE — Progress Notes (Addendum)
Patient signed 72 hr request for discharge on 01/05/19 at 0911. Providers [place], MD and Bobby Rumpf, NP notified. Maxwell Caul, RN 01/05/19 [phone]

## 2019-01-05 NOTE — BHH Group Notes (Signed)
[facility] LCSW Group Therapy Note  Date/Time:  01/05/2019 9:00-10:00 or 10:00-11:00AM  Type of Therapy and Topic:  Group Therapy:  Healthy and Unhealthy Supports  Participation Level:  Did Not Attend   Description of Group:  Patients in this group were introduced to the idea of adding a variety of healthy supports to address the various needs in their lives.Patients discussed what additional healthy supports could be helpful in their recovery and wellness after discharge in order to prevent future hospitalizations.   An emphasis was placed on using counselor, doctor, therapy groups, 12-step groups, and problem-specific support groups to expand supports.  They also worked as a group on developing a specific plan for several patients to deal with unhealthy supports through [place], psychoeducation with loved ones, and even termination of relationships.   Therapeutic Goals:   1)  discuss importance of adding supports to stay well once out of the hospital  2)  compare healthy versus unhealthy supports and identify some examples of each  3)  generate ideas and descriptions of healthy supports that can be added  4)  offer mutual support about how to address unhealthy supports  5)  encourage active participation in and adherence to discharge plan    Summary of Patient Progress:  Did not attend  Therapeutic Modalities:   Motivational Interviewing Brief Solution-Focused Therapy  Rolanda Jay

## 2019-01-06 LAB — POTASSIUM: Potassium: 3.7 mmol/L (ref 3.5–5.1)

## 2019-01-06 MED ORDER — VENLAFAXINE HCL ER 37.5 MG PO CP24: 37.5000 mg | ORAL_CAPSULE | Freq: Every day | ORAL | 0 refills | Status: DC

## 2019-01-06 MED ORDER — MIRTAZAPINE 15 MG PO TABS
15.0000 mg | ORAL_TABLET | Freq: Every day | ORAL | Status: DC
  Filled 2019-01-06: qty 1

## 2019-01-06 MED ORDER — CHLORTHALIDONE 25 MG PO TABS
25.0000 mg | ORAL_TABLET | Freq: Every day | ORAL | Status: DC
  Administered 2019-01-06: 25 mg via ORAL
  Filled 2019-01-06 (×2): qty 1

## 2019-01-06 MED ORDER — MIRTAZAPINE 15 MG PO TABS: 15.0000 mg | ORAL_TABLET | Freq: Every day | ORAL | 0 refills | Status: DC

## 2019-01-06 NOTE — BHH Suicide Risk Assessment (Signed)
[facility] Discharge Suicide Risk Assessment   Principal Problem: MDD (major depressive disorder), severe ([place]) Discharge Diagnoses: Principal Problem:   MDD (major depressive disorder), severe ([place])   Total Time spent with patient: 30 minutes  Musculoskeletal: Strength & Muscle Tone: within normal limits Gait & Station: normal Patient leans: N/A  Psychiatric Specialty Exam: ROS denies headache, no chest pain, no shortness of breath, no vomiting, no fever  Blood pressure (!) 145/110, pulse 85, temperature 97.8 F (36.6 C), temperature source Oral, resp. rate 16, height 5\' 5"  (1.651 m), weight 115.2 kg, last menstrual period 12/17/2018, SpO2 100 %.Body mass index is 42.27 kg/m.  General Appearance: Well Groomed  Eye Contact::  Good  Speech:  Normal Rate409  Volume:  Normal  Mood:  Reports feeling better today, less depressed  Affect:  Affect more reactive, calm, smiles appropriately during session, less irritable  Thought Process:  Linear and Descriptions of Associations: Intact  Orientation:  Full (Time, Place, and Person)  Thought Content:  No hallucinations, no delusions, does not appear internally preoccupied  Suicidal Thoughts:  No-denies suicidal or self-injurious ideations, denies any homicidal or violent ideations  Homicidal Thoughts:  No  Memory:  Recent and remote grossly intact  Judgement:  Other:  Improving  Insight:  Fair and Improving  Psychomotor Activity:  Normal-no psychomotor restlessness or agitation  Concentration:  Good  Recall:  Good  Fund of Knowledge:Good  Language: Good  Akathisia:  Negative  Handed:  Right  AIMS (if indicated):     Assets:  Communication Skills Desire for Improvement Resilience  Sleep:  Number of Hours: 4.5  Cognition: WNL  ADL's:  Intact   Mental Status Per Nursing Assessment::   On Admission:  NA  Demographic Factors:  12, married, 2 children, currently unemployed  Loss Factors: Financial stressors, death of grandmother and  brother last year  Historical Factors: History of a prior psychiatric admission in 2010 for depression and suicidal ideations.  History of cannabis abuse  Risk Reduction Factors:   Sense of responsibility to family, Living with another person, especially a relative and Positive coping skills or problem solving skills  Continued Clinical Symptoms:  Today patient presents alert, attentive, calm, without psychomotor agitation or restlessness, mood is described as improving, affect does present significantly improved/fuller in range, smiles appropriately during session, does not appear irritable or dysphoric at this time.  No thought disorder.  Denies suicidal or self-injurious ideations.  No homicidal or violent ideations.  Future oriented. No disruptive or agitated behaviors on unit. Currently does not endorse medication side effects.  Patient has a history of hypertension, blood pressure remains elevated, without associated symptoms.  She reports blood pressure was better controlled on combination of amlodipine, chlorthalidone, lisinopril.  Chlorthalidone was initially discontinued due to mild hypokalemia.  Repeat serum potassium within normal at 3.7.  Due to blood pressure trending up, resume chlorthalidone/home antihypertensive medications.  Patient will follow-up with her PCP.  Side effects have been reviewed.   Cognitive Features That Contribute To Risk:  No gross cognitive deficits noted upon discharge. Is alert , attentive, and oriented x 3   Suicide Risk:  Mild:  Suicidal ideation of limited frequency, intensity, duration, and specificity.  There are no identifiable plans, no associated intent, mild dysphoria and related symptoms, good self-control (both objective and subjective assessment), few other risk factors, and identifiable protective factors, including available and accessible social support.  [place] Follow up.    Specialty:  Behavioral Health Why:  Please attend your upcoming appointment on 2/10 at Contact information: [place] [address] [phone]           Plan Of Care/Follow-up recommendations:  Activity:  As tolerated Diet:  Heart healthy Tests:  NA Other:  See below  Patient is expressing readiness for discharge and has submitted request for discharge in writing, no current grounds for involuntary commitment.  She is leaving unit in good spirits.  Plans to return home/  plans to follow-up as above, and will continue to follow up with her PCP for medical management .   Jenne Campus, MD 01/06/2019, 9:37 AM

## 2019-01-06 NOTE — Discharge Summary (Addendum)
Physician Discharge Summary Note  Patient:  Tracy Coleman is an 43 y.o., female MRN:  [id number] DOB:  [birth year] Patient phone:  [phone] (home)  Patient address:   [address] [place] [address],  Total Time spent with patient: 15 minutes  Date of Admission:  01/03/2019 Date of Discharge: 01/06/2019  Reason for Admission:  Suicidal ideation  Principal Problem: MDD (major depressive disorder), severe ([place]) Discharge Diagnoses: Principal Problem:   MDD (major depressive disorder), severe ([place])   Past Psychiatric History: Per admission SRA: History of a prior psychiatric admission in 2010, for depression, suicidal ideations, which she states was associated with being on Chantix at the time, denies history of mania, hypomania. Denies history of psychosis.  Reports prior Paxil trial which she does not feel worked .  Reports past history of alcohol abuse, and had been drinking up to a bottle of wine per day, but states she has not consumed any alcohol x 3 weeks. Endorses Cannabis Abuse, stopped a week ago.   Past Medical History:  Past Medical History:  Diagnosis Date  . Anxiety   . Arthritis   . Asthma   . Depression   . Hypertension   . Obesity   . Post traumatic stress disorder (PTSD)   . PTSD (post-traumatic stress disorder)   . PTSD (post-traumatic stress disorder)     Past Surgical History:  Procedure Laterality Date  . ARTHRODESIS METATARSALPHALANGEAL JOINT (MTPJ) Right 04/11/2018   Procedure: ARTHRODESIS METATARSALPHALANGEAL JOINT (MTPJ);  Surgeon: Wylene Simmer, MD;  Location: [facility];  Service: Orthopedics;  Laterality: Right;   Family History:  Family History  Problem Relation Age of Onset  . Alcohol abuse Mother   . Drug abuse Mother   . Depression Mother   . Early death Mother        pneumonia, age 31  . Hypertension Mother   . Alcohol abuse Father   . Drug abuse Father   . Diabetes Maternal Grandmother   . Hypertension  Maternal Grandmother   . Diabetes Paternal Grandmother   . Hypertension Paternal Grandmother    Family Psychiatric  History: Per admission H&P: Pt did not give this information Social History:  Social History   Substance and Sexual Activity  Alcohol Use Yes  . Alcohol/week: 2.0 standard drinks  . Types: 2 Glasses of wine per week  . Frequency: Never   Comment: occassional     Social History   Substance and Sexual Activity  Drug Use Yes  . Types: Marijuana   Comment: last 04-10-18 (nearly daily for anxiety)    Social History   Socioeconomic History  . Marital status: Married    Spouse name: Not on file  . Number of children: Not on file  . Years of education: Not on file  . Highest education level: Not on file  Occupational History  . Not on file  Social Needs  . Financial resource strain: Not on file  . Food insecurity:    Worry: Not on file    Inability: Not on file  . Transportation needs:    Medical: Not on file    Non-medical: Not on file  Tobacco Use  . Smoking status: Current Every Day Smoker    Packs/day: 0.50  . Smokeless tobacco: Never Used  Substance and Sexual Activity  . Alcohol use: Yes    Alcohol/week: 2.0 standard drinks    Types: 2 Glasses of wine per week    Frequency: Never  Comment: occassional  . Drug use: Yes    Types: Marijuana    Comment: last 04-10-18 (nearly daily for anxiety)  . Sexual activity: Yes    Birth control/protection: I.U.D.  Lifestyle  . Physical activity:    Days per week: Not on file    Minutes per session: Not on file  . Stress: Not on file  Relationships  . Social connections:    Talks on phone: Not on file    Gets together: Not on file    Attends religious service: Not on file    Active member of club or organization: Not on file    Attends meetings of clubs or organizations: Not on file    Relationship status: Not on file  Other Topics Concern  . Not on file  Social History Narrative  . Not on file     Hospital Course:  Per admission H&P 01/04/2019: Tracy Coleman is a 29, married female, she has two children, ages 30,15, currently with stepfather. Unemployed. She presented to ED voluntarily, reporting worsening depression, poor sleep, poor appetite, low energy level, and anhedonia. She expressed passive suicidal ideations. She does not exhibit psychotic symptoms. She names contributing stressors as the death of grandmother and a brother last year. States she had been smoking cannabis daily up to about a week ago, as it caused marital discord and was negatively affecting her financial situation. Her admission UDS was negative. She stated she has not smoked for the past week.  She also reports a past history of alcohol use and was drinking a bottle of wine per day, she has not had any alcohol for three weeks.   Tracy Coleman was admitted for depression with suicidal ideation. Wellbutrin and Celexa were discontinued. Effexor and Remeron were started. She remained on the [facility] unit for 3 days. She stabilized with medication and therapy. She was discharged on the medications listed below. She has shown improvement with improved mood, affect, sleep, appetite, and interaction. She denies any SI/HI/AVH and contracts for safety. Patient agrees to follow up at [facility] [place]. Patient is provided with prescriptions for medications upon discharge.  Physical Findings: AIMS: Facial and Oral Movements Muscles of Facial Expression: None, normal Lips and Perioral Area: None, normal Jaw: None, normal Tongue: None, normal,Extremity Movements Upper (arms, wrists, hands, fingers): None, normal Lower (legs, knees, ankles, toes): None, normal, Trunk Movements Neck, shoulders, hips: None, normal, Overall Severity Severity of abnormal movements (highest score from questions above): None, normal Incapacitation due to abnormal movements: None, normal Patient's awareness of abnormal movements (rate only  patient's report): No Awareness, Dental Status Current problems with teeth and/or dentures?: No Does patient usually wear dentures?: No  CIWA:  CIWA-Ar Total: 1 COWS:  COWS Total Score: 1  Musculoskeletal: Strength & Muscle Tone: within normal limits Gait & Station: normal Patient leans: N/A  Psychiatric Specialty Exam: Physical Exam  Nursing note and vitals reviewed. Constitutional: She is oriented to person, place, and time. She appears well-developed and well-nourished.  Cardiovascular: Normal rate.  Respiratory: Effort normal.  Neurological: She is alert and oriented to person, place, and time.    Review of Systems  Constitutional: Negative.   Respiratory: Negative.   Cardiovascular: Negative.   Psychiatric/Behavioral: Positive for depression (improving) and substance abuse (hx ETOH, THC). Negative for hallucinations, memory loss and suicidal ideas. The patient is not nervous/anxious and does not have insomnia.     Blood pressure (!) 150/109, pulse 80, temperature 97.8 F (36.6 C), temperature  source Oral, resp. rate 16, height 5\' 5"  (1.651 m), weight 115.2 kg, last menstrual period 12/17/2018, SpO2 100 %.Body mass index is 42.27 kg/m.  See MD's discharge SRA     Have you used any form of tobacco in the last 30 days? (Cigarettes, Smokeless Tobacco, Cigars, and/or Pipes): Yes  Has this patient used any form of tobacco in the last 30 days? (Cigarettes, Smokeless Tobacco, Cigars, and/or Pipes)  No  Blood Alcohol level:  No results found for: [facility]  Metabolic Disorder Labs:  Lab Results  Component Value Date   HGBA1C 5.9 (H) 01/04/2019   MPG 122.63 01/04/2019   No results found for: PROLACTIN Lab Results  Component Value Date   CHOL 178 01/04/2019   TRIG 123 01/04/2019   HDL 35 (L) 01/04/2019   CHOLHDL 5.1 01/04/2019   VLDL 25 01/04/2019   LDLCALC 118 (H) 01/04/2019    See Psychiatric Specialty Exam and Suicide Risk Assessment completed by Attending Physician  prior to discharge.  Discharge destination:  Home  Is patient on multiple antipsychotic therapies at discharge:  No   Has Patient had three or more failed trials of antipsychotic monotherapy by history:  No  Recommended Plan for Multiple Antipsychotic Therapies: NA  Discharge Instructions    Discharge instructions   Complete by:  As directed    Patient is instructed to take all prescribed medications as recommended. Report any side effects or adverse reactions to your outpatient psychiatrist. Patient is instructed to abstain from alcohol and illegal drugs while on prescription medications. In the event of worsening symptoms, patient is instructed to call the crisis hotline, 911, or go to the nearest emergency department for evaluation and treatment.     Allergies as of 01/06/2019      Reactions   Varenicline Other (See Comments)   May have contributed to suicidal ideation May have contributed to suicidal ideation      Medication List    STOP taking these medications   buPROPion 300 MG 24 hr tablet Commonly known as:  WELLBUTRIN XL   citalopram 40 MG tablet Commonly known as:  CELEXA   docusate sodium 100 MG capsule Commonly known as:  COLACE   famotidine 20 MG tablet Commonly known as:  PEPCID   meloxicam 7.5 MG tablet Commonly known as:  MOBIC   prazosin 1 MG capsule Commonly known as:  MINIPRESS   senna 8.6 MG Tabs tablet Commonly known as:  SENOKOT   traMADol 50 MG tablet Commonly known as:  ULTRAM   traZODone 100 MG tablet Commonly known as:  DESYREL     TAKE these medications     Indication  amLODipine 5 MG tablet Commonly known as:  NORVASC Take 1 tablet (5 mg total) by mouth daily.  Indication:  High Blood Pressure Disorder   cetirizine-pseudoephedrine 5-120 MG tablet Commonly known as:  ZYRTEC-D Take by mouth.  Indication:  Hayfever   chlorthalidone 25 MG tablet Commonly known as:  HYGROTON Take 1 tablet (25 mg total) by mouth daily.   Indication:  High Blood Pressure Disorder   levonorgestrel 20 MCG/24HR IUD Commonly known as:  MIRENA by Intrauterine route.  Indication:  Birth Control Treatment   lisinopril 40 MG tablet Commonly known as:  PRINIVIL,ZESTRIL Take 1 tablet (40 mg total) by mouth daily.  Indication:  High Blood Pressure Disorder   mirtazapine 15 MG tablet Commonly known as:  REMERON Take 1 tablet (15 mg total) by mouth at bedtime. For mood  Indication:  Major Depressive Disorder   PROVENTIL HFA 108 (90 Base) MCG/ACT inhaler Generic drug:  albuterol Inhale into the lungs.  Indication:  Shortness of breath   THERA Tabs Take by mouth.  Indication:  Supplementation   venlafaxine XR 37.5 MG 24 hr capsule Commonly known as:  EFFEXOR-XR Take 1 capsule (37.5 mg total) by mouth daily with breakfast. For mood Start taking on:  January 07, 2019  Indication:  Major Depressive Disorder      Follow-up [place], [facility] Follow up.   Specialty:  Behavioral Health Why:  Please attend your upcoming appointment on 2/10 at Contact information: [address] [place] [address] [phone]           Follow-up recommendations: Activity as tolerated. Diet as recommended by primary care physician. Keep all scheduled follow-up appointments as recommended.   Comments:   Patient is instructed to take all prescribed medications as recommended. Report any side effects or adverse reactions to your outpatient psychiatrist. Patient is instructed to abstain from alcohol and illegal drugs while on prescription medications. In the event of worsening symptoms, patient is instructed to call the crisis hotline, 911, or go to the nearest emergency department for evaluation and treatment.  Signed: Connye Burkitt, NP 01/06/2019, 2:20 PM   Patient seen, Suicide Assessment Completed.  Disposition Plan Reviewed

## 2019-01-06 NOTE — Progress Notes (Signed)
Discharge Note:  Patient discharged home with family member.  Patient denied SI and HI.  Denied A/V hallucinations.  Suicide prevention information given to patient who stated she understood and had no questions.  Patient stated she received all her belongings, clothing, toiletries, misc items, etc.  All required discharge information given to patient at discharge.

## 2019-01-06 NOTE — Tx Team (Signed)
Interdisciplinary Treatment and Diagnostic Plan Update  01/06/2019 Time of Session: 01:00am Tracy Coleman MRN: [id number]  Principal Diagnosis: MDD (major depressive disorder), severe ([place])  Secondary Diagnoses: Principal Problem:   MDD (major depressive disorder), severe ([place])   Current Medications:  Current Facility-Administered Medications  Medication Dose Route Frequency Provider Last Rate Last Dose  . acetaminophen (TYLENOL) tablet 650 mg  650 mg Oral Q6H PRN Tracy, Lowry Ram, FNP      . alum & mag hydroxide-simeth (MAALOX/MYLANTA) 200-200-20 MG/5ML suspension 30 mL  30 mL Oral Q4H PRN Tracy, Tracy Maffucci B, FNP      . amLODipine (NORVASC) tablet 5 mg  5 mg Oral Daily Tracy, Myer Peer, MD   5 mg at 01/06/19 0817  . chlorthalidone (HYGROTON) tablet 25 mg  25 mg Oral Daily Tracy, Myer Peer, MD   25 mg at 01/06/19 0915  . lisinopril (PRINIVIL,ZESTRIL) tablet 40 mg  40 mg Oral Daily Tracy, Myer Peer, MD   40 mg at 01/06/19 0818  . magnesium hydroxide (MILK OF MAGNESIA) suspension 30 mL  30 mL Oral Daily PRN Tracy, Lowry Ram, FNP      . mirtazapine (REMERON) tablet 15 mg  15 mg Oral QHS Tracy, Tracy A, MD      . venlafaxine XR (EFFEXOR-XR) 24 hr capsule 37.5 mg  37.5 mg Oral Q breakfast Tracy, Myer Peer, MD   37.5 mg at 01/06/19 8341   PTA Medications: Medications Prior to Admission  Medication Sig Dispense Refill Last Dose  . docusate sodium (COLACE) 100 MG capsule Take 1 capsule (100 mg total) by mouth 2 (two) times daily. While taking narcotic pain medicine. 30 capsule 0   . albuterol (PROVENTIL HFA) 108 (90 Base) MCG/ACT inhaler Inhale into the lungs.     Marland Kitchen amLODipine (NORVASC) 5 MG tablet Take 1 tablet (5 mg total) by mouth daily. 90 tablet 3   . buPROPion (WELLBUTRIN XL) 300 MG 24 hr tablet Take 1 tablet (300 mg total) by mouth daily. 90 tablet 0   . cetirizine-pseudoephedrine (ZYRTEC-D) 5-120 MG tablet Take by mouth.   Not Taking at Unknown time  . chlorthalidone (HYGROTON) 25  MG tablet Take 1 tablet (25 mg total) by mouth daily. 90 tablet 3   . citalopram (CELEXA) 40 MG tablet Take 1 tablet (40 mg total) by mouth daily. 90 tablet 0   . famotidine (PEPCID) 20 MG tablet Take 1 tablet (20 mg total) by mouth daily. 90 tablet 1   . levonorgestrel (MIRENA) 20 MCG/24HR IUD by Intrauterine route.   More than a month at Unknown time  . lisinopril (PRINIVIL,ZESTRIL) 40 MG tablet Take 1 tablet (40 mg total) by mouth daily. 90 tablet 3   . meloxicam (MOBIC) 7.5 MG tablet Take 1 tablet (7.5 mg total) by mouth daily. (Patient not taking: Reported on 01/04/2019) 30 tablet 0 Not Taking at Unknown time  . Multiple Vitamin (THERA) TABS Take by mouth.   More than a month at Unknown time  . prazosin (MINIPRESS) 1 MG capsule Take 1 mg by mouth at bedtime.   More than a month at Unknown time  . senna (SENOKOT) 8.6 MG TABS tablet Take 2 tablets (17.2 mg total) by mouth 2 (two) times daily. 30 each 0   . traMADol (ULTRAM) 50 MG tablet tramadol 50 mg tablet  TAKE 1 TABLET BY MOUTH EVERY 6 HOURS AS NEEDED FOR 4 DAYS   Not Taking at Unknown time  . traZODone (DESYREL) 100 MG tablet Take 200  mg by mouth.   3 Past Week at Unknown time    Patient Stressors: Marital or family conflict Medication change or noncompliance Substance abuse Traumatic event  Patient Strengths: Ability for insight Capable of independent living Communication skills Motivation for treatment/growth Supportive family/friends  Treatment Modalities: Medication Management, Group therapy, Case management,  1 to 1 session with clinician, Psychoeducation, Recreational therapy.   Physician Treatment Plan for Primary Diagnosis: MDD (major depressive disorder), severe ([place]) Long Term Goal(s): Improvement in symptoms so as ready for discharge Improvement in symptoms so as ready for discharge   Short Term Goals: Ability to identify changes in lifestyle to reduce recurrence of condition will improve Ability to verbalize  feelings will improve Ability to disclose and discuss suicidal ideas Ability to demonstrate self-control will improve Ability to identify and develop effective coping behaviors will improve Compliance with prescribed medications will improve Ability to identify triggers associated with substance abuse/mental health issues will improve Ability to identify changes in lifestyle to reduce recurrence of condition will improve Ability to verbalize feelings will improve Ability to disclose and discuss suicidal ideas Ability to demonstrate self-control will improve Ability to identify and develop effective coping behaviors will improve Ability to maintain clinical measurements within normal limits will improve Compliance with prescribed medications will improve Ability to identify triggers associated with substance abuse/mental health issues will improve  Medication Management: Evaluate patient's response, side effects, and tolerance of medication regimen.  Therapeutic Interventions: 1 to 1 sessions, Unit Group sessions and Medication administration.  Evaluation of Outcomes: Adequate for Discharge  Physician Treatment Plan for Secondary Diagnosis: Principal Problem:   MDD (major depressive disorder), severe ([place])  Long Term Goal(s): Improvement in symptoms so as ready for discharge Improvement in symptoms so as ready for discharge   Short Term Goals: Ability to identify changes in lifestyle to reduce recurrence of condition will improve Ability to verbalize feelings will improve Ability to disclose and discuss suicidal ideas Ability to demonstrate self-control will improve Ability to identify and develop effective coping behaviors will improve Compliance with prescribed medications will improve Ability to identify triggers associated with substance abuse/mental health issues will improve Ability to identify changes in lifestyle to reduce recurrence of condition will improve Ability to  verbalize feelings will improve Ability to disclose and discuss suicidal ideas Ability to demonstrate self-control will improve Ability to identify and develop effective coping behaviors will improve Ability to maintain clinical measurements within normal limits will improve Compliance with prescribed medications will improve Ability to identify triggers associated with substance abuse/mental health issues will improve     Medication Management: Evaluate patient's response, side effects, and tolerance of medication regimen.  Therapeutic Interventions: 1 to 1 sessions, Unit Group sessions and Medication administration.  Evaluation of Outcomes: Adequate for Discharge   RN Treatment Plan for Primary Diagnosis: MDD (major depressive disorder), severe ([place]) Long Term Goal(s): Knowledge of disease and therapeutic regimen to maintain health will improve  Short Term Goals: Ability to demonstrate self-control, Ability to participate in decision making will improve and Ability to identify and develop effective coping behaviors will improve  Medication Management: RN will administer medications as ordered by provider, will assess and evaluate patient's response and provide education to patient for prescribed medication. RN will report any adverse and/or side effects to prescribing provider.  Therapeutic Interventions: 1 on 1 counseling sessions, Psychoeducation, Medication administration, Evaluate responses to treatment, Monitor vital signs and CBGs as ordered, Perform/monitor CIWA, COWS, AIMS and Fall Risk screenings as ordered, Perform wound  care treatments as ordered.  Evaluation of Outcomes: Adequate for Discharge   LCSW Treatment Plan for Primary Diagnosis: MDD (major depressive disorder), severe ([place]) Long Term Goal(s): Safe transition to appropriate next level of care at discharge, Engage patient in therapeutic group addressing interpersonal concerns.  Short Term Goals: Engage patient in  aftercare planning with referrals and resources, Increase social support, Identify triggers associated with mental health/substance abuse issues and Increase skills for wellness and recovery  Therapeutic Interventions: Assess for all discharge needs, 1 to 1 time with Social worker, Explore available resources and support systems, Assess for adequacy in community support network, Educate family and significant other(s) on suicide prevention, Complete Psychosocial Assessment, Interpersonal group therapy.  Evaluation of Outcomes: Adequate for Discharge   Progress in Treatment: Attending groups: Yes. Participating in groups: Yes. Taking medication as prescribed: Yes. Toleration medication: Yes. Family/Significant other contact made: No, will contact:  Patient does not give consents, SPE to be completed with patient. Patient understands diagnosis: Yes. Discussing patient identified problems/goals with staff: Yes. Medical problems stabilized or resolved: Yes. Denies suicidal/homicidal ideation: Yes. Issues/concerns per patient self-inventory: No.  New problem(s) identified: No, Describe:  none  New Short Term/Long Term Goal(s): medication management for mood stabilization; elimination of SI thoughts; development of comprehensive mental wellness/sobriety plan.  Patient Goals:  "To go home."  Discharge Plan or Barriers: Patient discharging home and following up with [facility] for outpatient.   Reason for Continuation of Hospitalization: Anxiety Depression  Estimated Length of Stay: Discharge today.  Attendees: Patient: Tracy Coleman 01/06/2019 11:15 AM  Physician:  01/06/2019 11:15 AM  Nursing:  01/06/2019 11:15 AM  RN Care Manager: 01/06/2019 11:15 AM  Social Worker: Stephanie Acre, [place] 01/06/2019 11:15 AM  Recreational Therapist:  01/06/2019 11:15 AM  Other:  01/06/2019 11:15 AM  Other:  01/06/2019 11:15 AM  Other: 01/06/2019 11:15 AM    Scribe for Treatment Team: Joellen Jersey, LCSWA 01/06/2019 11:15 AM

## 2019-01-06 NOTE — Progress Notes (Signed)
  [facility] Adult Case Management Discharge Plan :  Will you be returning to the same living situation after discharge:  Yes,  patient reports she is returning home with her husband At discharge, do you have transportation home?: Yes,  patient reports her husband is picking her up at discharge Do you have the ability to pay for your medications: Yes,  Medicaid  Release of information consent forms completed and in the chart;  Patient's signature needed at discharge.  Patient to Follow up at: [place], [facility] Follow up.   Specialty:  Behavioral Health Why:  Please attend your upcoming appointment on 2/10 at Contact information: [address] [place] [address] [phone]           Next level of care provider has access to [place] and Suicide Prevention discussed: Yes,  with the patient  Have you used any form of tobacco in the last 30 days? (Cigarettes, Smokeless Tobacco, Cigars, and/or Pipes): Yes  Has patient been referred to the Quitline?: Patient refused referral  Patient has been referred for addiction treatment: N/A  Marylee Floras, LCSWA 01/06/2019, 11:48 AM

## 2019-01-06 NOTE — Progress Notes (Signed)
DAR:  Pt observed in the dayroom this evening visiting with her daughter and husband which stated went well. Pt stated she felt better after transferred from [address] hall. Pt complained of remeron 7.5 mg was not working for her, one time ordered of 7.5 mg was given with good effect.  Pt's safety ensured with 15 minute and environmental checks. Pt currently denies SI/HI and A/V hallucinations. Pt verbally agrees to seek staff if SI/HI or A/VH occurs and to consult with staff before acting on these thoughts. Will continue POC.

## 2019-01-06 NOTE — [facility] Suicide Risk Assessment (Signed)
[facility] INPATIENT:  Family/Significant Other Suicide Prevention Education  Suicide Prevention Education:   SPE completed with patient, as patient refused to consent to family contact. SPI pamphlet provided to pt and pt was encouraged to share information with support network, ask questions, and talk about any concerns relating to SPE. Patient denies access to guns/firearms and verbalized understanding of information provided. Mobile Crisis information also provided to patient.  Tiarra Anastacio, MSW, LCSWA Clinical Social Worker [facility]  Phone: [phone]  

## 2019-01-06 NOTE — Progress Notes (Signed)
D:  Patient's self inventory sheet, patient sleeps good, sleep medication helpful.  Good appetite, high energy level, good concentration.  Rated depression 1, denied hopeless and anxiety.  Denied withdrawals.  Denied SI.  Physical problems, headaches.  No physical pain.  Goal is make appointment with grief counseling.  Needs to call for appointment.  Does have discharge plans. A:  Medications administered per MD orders.  Emotional support and encouragement given patient. R:  Denied SI and HI, contracts for safety.  Denied A/V hallucinations.  Safety maintained with 15 minute checks.

## 2019-01-07 ENCOUNTER — Encounter (HOSPITAL_COMMUNITY): Payer: Self-pay

## 2019-01-07 ENCOUNTER — Ambulatory Visit (HOSPITAL_COMMUNITY)
Admission: EM | Admit: 2019-01-07 | Discharge: 2019-01-07 | Disposition: A | Discharge status: Home / Self Care | Payer: Medicaid Other | Attending: Family Medicine | Admitting: Family Medicine

## 2019-01-07 DIAGNOSIS — H5789 Other specified disorders of eye and adnexa: Secondary | ICD-10-CM | POA: Diagnosis not present

## 2019-01-07 DIAGNOSIS — H1013 Acute atopic conjunctivitis, bilateral: Secondary | ICD-10-CM

## 2019-01-07 MED ORDER — PROPYLENE GLYCOL 0.6 % OP SOLN: 1.0000 [drp] | Freq: Every day | OPHTHALMIC | 0 refills | Status: DC | PRN

## 2019-01-07 MED ORDER — CETIRIZINE HCL 10 MG PO CHEW: 10.0000 mg | CHEWABLE_TABLET | Freq: Every day | ORAL | 0 refills | Status: DC

## 2019-01-07 NOTE — ED Provider Notes (Signed)
[facility]   [id number] 01/07/19 Arrival Time: 0830  CC:EYE IRRITATION  SUBJECTIVE:  Delilah Mulgrew is a 43 y.o. female who presents with complaint of eye irritation and clear drainage x 1 day.  Denies a precipitating event, trauma, or personal contact with bacterial conjunctivitis.  Does admit to increased crying the other day.  Has tried eye rinse without relief.   Denies fever, chills, nausea, vomiting, eye pain, itching, vision changes, double vision, FB sensation.     Denies contact lens use.    ROS: As per HPI.  Past Medical History:  Diagnosis Date  . Anxiety   . Arthritis   . Asthma   . Depression   . Hypertension   . Obesity   . Post traumatic stress disorder (PTSD)   . PTSD (post-traumatic stress disorder)   . PTSD (post-traumatic stress disorder)    Past Surgical History:  Procedure Laterality Date  . ARTHRODESIS METATARSALPHALANGEAL JOINT (MTPJ) Right 04/11/2018   Procedure: ARTHRODESIS METATARSALPHALANGEAL JOINT (MTPJ);  Surgeon: Wylene Simmer, MD;  Location: [facility];  Service: Orthopedics;  Laterality: Right;   Allergies  Allergen Reactions  . Varenicline Other (See Comments)    May have contributed to suicidal ideation May have contributed to suicidal ideation   No current facility-administered medications on file prior to encounter.    Current Outpatient Medications on File Prior to Encounter  Medication Sig Dispense Refill  . albuterol (PROVENTIL HFA) 108 (90 Base) MCG/ACT inhaler Inhale into the lungs.    Marland Kitchen amLODipine (NORVASC) 5 MG tablet Take 1 tablet (5 mg total) by mouth daily. 90 tablet 3  . chlorthalidone (HYGROTON) 25 MG tablet Take 1 tablet (25 mg total) by mouth daily. 90 tablet 3  . levonorgestrel (MIRENA) 20 MCG/24HR IUD by Intrauterine route.    Marland Kitchen lisinopril (PRINIVIL,ZESTRIL) 40 MG tablet Take 1 tablet (40 mg total) by mouth daily. 90 tablet 3  . mirtazapine (REMERON) 15 MG tablet Take 1 tablet (15 mg total) by  mouth at bedtime. For mood 30 tablet 0  . Multiple Vitamin (THERA) TABS Take by mouth.    . venlafaxine XR (EFFEXOR-XR) 37.5 MG 24 hr capsule Take 1 capsule (37.5 mg total) by mouth daily with breakfast. For mood 30 capsule 0   Social History   Socioeconomic History  . Marital status: Married    Spouse name: Not on file  . Number of children: Not on file  . Years of education: Not on file  . Highest education level: Not on file  Occupational History  . Not on file  Social Needs  . Financial resource strain: Not on file  . Food insecurity:    Worry: Not on file    Inability: Not on file  . Transportation needs:    Medical: Not on file    Non-medical: Not on file  Tobacco Use  . Smoking status: Current Every Day Smoker    Packs/day: 0.50  . Smokeless tobacco: Never Used  Substance and Sexual Activity  . Alcohol use: Yes    Alcohol/week: 2.0 standard drinks    Types: 2 Glasses of wine per week    Frequency: Never    Comment: occassional  . Drug use: Yes    Types: Marijuana    Comment: last 04-10-18 (nearly daily for anxiety)  . Sexual activity: Yes    Birth control/protection: I.U.D.  Lifestyle  . Physical activity:    Days per week: Not on file    Minutes per session: Not  on file  . Stress: Not on file  Relationships  . Social connections:    Talks on phone: Not on file    Gets together: Not on file    Attends religious service: Not on file    Active member of club or organization: Not on file    Attends meetings of clubs or organizations: Not on file    Relationship status: Not on file  . Intimate partner violence:    Fear of current or ex partner: Not on file    Emotionally abused: Not on file    Physically abused: Not on file    Forced sexual activity: Not on file  Other Topics Concern  . Not on file  Social History Narrative  . Not on file   Family History  Problem Relation Age of Onset  . Alcohol abuse Mother   . Drug abuse Mother   . Depression Mother    . Early death Mother        pneumonia, age 50  . Hypertension Mother   . Alcohol abuse Father   . Drug abuse Father   . Diabetes Maternal Grandmother   . Hypertension Maternal Grandmother   . Diabetes Paternal Grandmother   . Hypertension Paternal Grandmother     OBJECTIVE:    Visual Acuity  Right Eye Distance: 20/20 Corrected  Left Eye Distance:  20/20 Corrected  Bilateral Distance:    Vitals:   01/07/19 0913  BP: 121/89  Pulse: 85  Resp: 18  Temp: 98 F (36.7 C)  TempSrc: Oral  SpO2: 98%    General appearance: alert; no distress Eyes: No conjunctival erythema. PERRL; EOMI without discomfort;  no obvious drainage Neck: supple Lungs: clear to auscultation bilaterally Heart: regular rate and rhythm Skin: warm and dry Psychological: alert and cooperative; normal mood and affect   ASSESSMENT & PLAN:  1. Irritation of both eyes   2. Allergic conjunctivitis of both eyes     Meds ordered this encounter  Medications  . cetirizine (ZYRTEC) 10 MG chewable tablet    Sig: Chew 1 tablet (10 mg total) by mouth daily.    Dispense:  20 tablet    Refill:  0    Order Specific Question:   Supervising Provider    Answer:   Raylene Everts [[id number]]  . Propylene Glycol 0.6 % SOLN    Sig: Apply 1-2 drops to eye daily as needed.    Dispense:  15 mL    Refill:  0    Order Specific Question:   Supervising Provider    Answer:   Raylene Everts [[id number]]   Zyrtec prescribed take as directed Systane eye drop prescribed.  Use as directed Follow up with PCP or optometrist if symptoms persists Return or go to the ED if you have any new or worsening symptoms such as vision changes, purulent discharge, light sensitivity, eye pain, redness or swelling around eyes, fever, chills, nausea, vomiting, etc...  Reviewed expectations re: course of current medical issues. Questions answered. Outlined signs and symptoms indicating need for more acute intervention. Patient verbalized  understanding. After Visit Summary given.   Lestine Box, PA-C 01/07/19 [phone]

## 2019-01-07 NOTE — Discharge Instructions (Signed)
Zyrtec prescribed take as directed Systane eye drop prescribed.  Use as directed Follow up with PCP or optometrist if symptoms persists Return or go to the ED if you have any new or worsening symptoms such as vision changes, purulent discharge, light sensitivity, eye pain, redness or swelling around eyes, fever, chills, nausea, vomiting, etc..Marland Kitchen

## 2019-01-07 NOTE — ED Triage Notes (Signed)
Pt present pink eye, pt states when she woke up this morning her eye was stuck together with some drainage.  Pt states her eye is very irritated

## 2019-01-15 ENCOUNTER — Encounter (HOSPITAL_BASED_OUTPATIENT_CLINIC_OR_DEPARTMENT_OTHER): Payer: Self-pay | Admitting: *Deleted

## 2019-01-23 ENCOUNTER — Ambulatory Visit (HOSPITAL_BASED_OUTPATIENT_CLINIC_OR_DEPARTMENT_OTHER)
Admission: RE | Admit: 2019-01-23 | Discharge: 2019-01-23 | Disposition: A | Discharge status: Home / Self Care | Payer: Medicaid Other | Attending: Orthopedic Surgery | Admitting: Orthopedic Surgery

## 2019-01-23 ENCOUNTER — Encounter (HOSPITAL_BASED_OUTPATIENT_CLINIC_OR_DEPARTMENT_OTHER)
Admission: RE | Disposition: A | Discharge status: Home / Self Care | Payer: Self-pay | Source: Home / Self Care | Attending: Orthopedic Surgery

## 2019-01-23 ENCOUNTER — Ambulatory Visit (HOSPITAL_BASED_OUTPATIENT_CLINIC_OR_DEPARTMENT_OTHER): Payer: Medicaid Other | Admitting: Anesthesiology

## 2019-01-23 ENCOUNTER — Other Ambulatory Visit: Payer: Self-pay

## 2019-01-23 ENCOUNTER — Encounter (HOSPITAL_BASED_OUTPATIENT_CLINIC_OR_DEPARTMENT_OTHER): Payer: Self-pay | Admitting: *Deleted

## 2019-01-23 DIAGNOSIS — M25774 Osteophyte, right foot: Secondary | ICD-10-CM | POA: Diagnosis not present

## 2019-01-23 DIAGNOSIS — Z30430 Encounter for insertion of intrauterine contraceptive device: Secondary | ICD-10-CM | POA: Diagnosis not present

## 2019-01-23 DIAGNOSIS — M19071 Primary osteoarthritis, right ankle and foot: Secondary | ICD-10-CM | POA: Insufficient documentation

## 2019-01-23 DIAGNOSIS — J45909 Unspecified asthma, uncomplicated: Secondary | ICD-10-CM | POA: Insufficient documentation

## 2019-01-23 DIAGNOSIS — Z6841 Body Mass Index (BMI) 40.0 and over, adult: Secondary | ICD-10-CM | POA: Diagnosis not present

## 2019-01-23 DIAGNOSIS — F419 Anxiety disorder, unspecified: Secondary | ICD-10-CM | POA: Insufficient documentation

## 2019-01-23 DIAGNOSIS — X58XXXD Exposure to other specified factors, subsequent encounter: Secondary | ICD-10-CM | POA: Diagnosis not present

## 2019-01-23 DIAGNOSIS — F329 Major depressive disorder, single episode, unspecified: Secondary | ICD-10-CM | POA: Diagnosis not present

## 2019-01-23 DIAGNOSIS — Z87891 Personal history of nicotine dependence: Secondary | ICD-10-CM | POA: Diagnosis not present

## 2019-01-23 DIAGNOSIS — I10 Essential (primary) hypertension: Secondary | ICD-10-CM | POA: Insufficient documentation

## 2019-01-23 DIAGNOSIS — S92251K Displaced fracture of navicular [scaphoid] of right foot, subsequent encounter for fracture with nonunion: Secondary | ICD-10-CM | POA: Insufficient documentation

## 2019-01-23 DIAGNOSIS — M79671 Pain in right foot: Secondary | ICD-10-CM | POA: Diagnosis present

## 2019-01-23 DIAGNOSIS — Z79899 Other long term (current) drug therapy: Secondary | ICD-10-CM | POA: Insufficient documentation

## 2019-01-23 HISTORY — PX: FOOT ARTHRODESIS: SHX1655

## 2019-01-23 LAB — POCT PREGNANCY, URINE: Preg Test, Ur: NEGATIVE

## 2019-01-23 SURGERY — FUSION, JOINT, FOOT
Anesthesia: General | Site: Foot | Laterality: Right

## 2019-01-23 MED ORDER — PHENYLEPHRINE 40 MCG/ML (10ML) SYRINGE FOR IV PUSH (FOR BLOOD PRESSURE SUPPORT)
PREFILLED_SYRINGE | INTRAVENOUS | Status: DC | PRN
  Administered 2019-01-23 (×2): 80 ug via INTRAVENOUS

## 2019-01-23 MED ORDER — HEMOSTATIC AGENTS (NO CHARGE) OPTIME
TOPICAL | Status: DC | PRN
  Administered 2019-01-23: 1 via TOPICAL

## 2019-01-23 MED ORDER — MIDAZOLAM HCL 2 MG/2ML IJ SOLN
INTRAMUSCULAR | Status: AC
  Filled 2019-01-23: qty 2

## 2019-01-23 MED ORDER — MEPERIDINE HCL 25 MG/ML IJ SOLN: 6.2500 mg | INTRAMUSCULAR | Status: DC | PRN

## 2019-01-23 MED ORDER — SCOPOLAMINE 1 MG/3DAYS TD PT72: 1.0000 | MEDICATED_PATCH | Freq: Once | TRANSDERMAL | Status: DC | PRN

## 2019-01-23 MED ORDER — LIDOCAINE 2% (20 MG/ML) 5 ML SYRINGE
INTRAMUSCULAR | Status: DC | PRN
  Administered 2019-01-23: 100 mg via INTRAVENOUS

## 2019-01-23 MED ORDER — LACTATED RINGERS IV SOLN
INTRAVENOUS | Status: DC
  Administered 2019-01-23 (×2): via INTRAVENOUS

## 2019-01-23 MED ORDER — ONDANSETRON HCL 4 MG/2ML IJ SOLN
INTRAMUSCULAR | Status: DC | PRN
  Administered 2019-01-23: 4 mg via INTRAVENOUS

## 2019-01-23 MED ORDER — DEXAMETHASONE SODIUM PHOSPHATE 10 MG/ML IJ SOLN
INTRAMUSCULAR | Status: AC
  Filled 2019-01-23: qty 1

## 2019-01-23 MED ORDER — HYDROMORPHONE HCL 1 MG/ML IJ SOLN
0.2500 mg | INTRAMUSCULAR | Status: DC | PRN
  Administered 2019-01-23 (×3): 0.5 mg via INTRAVENOUS

## 2019-01-23 MED ORDER — LABETALOL HCL 5 MG/ML IV SOLN
INTRAVENOUS | Status: AC
  Filled 2019-01-23: qty 4

## 2019-01-23 MED ORDER — HYDROMORPHONE HCL 1 MG/ML IJ SOLN
INTRAMUSCULAR | Status: AC
  Filled 2019-01-23: qty 0.5

## 2019-01-23 MED ORDER — DEXAMETHASONE SODIUM PHOSPHATE 10 MG/ML IJ SOLN
INTRAMUSCULAR | Status: DC | PRN
  Administered 2019-01-23: 10 mg via INTRAVENOUS

## 2019-01-23 MED ORDER — OXYCODONE HCL 5 MG/5ML PO SOLN: 5.0000 mg | Freq: Once | ORAL | Status: AC | PRN

## 2019-01-23 MED ORDER — ROPIVACAINE HCL 5 MG/ML IJ SOLN
INTRAMUSCULAR | Status: DC | PRN
  Administered 2019-01-23: 30 mL via PERINEURAL

## 2019-01-23 MED ORDER — FENTANYL CITRATE (PF) 100 MCG/2ML IJ SOLN
INTRAMUSCULAR | Status: AC
  Filled 2019-01-23: qty 2

## 2019-01-23 MED ORDER — OXYCODONE HCL 5 MG PO TABS: 5.0000 mg | ORAL_TABLET | ORAL | 0 refills | Status: AC | PRN

## 2019-01-23 MED ORDER — OXYCODONE HCL 5 MG PO TABS
5.0000 mg | ORAL_TABLET | Freq: Once | ORAL | Status: AC | PRN
  Administered 2019-01-23: 5 mg via ORAL

## 2019-01-23 MED ORDER — ENOXAPARIN SODIUM 40 MG/0.4ML ~~LOC~~ SOLN: 40.0000 mg | SUBCUTANEOUS | 0 refills | Status: DC

## 2019-01-23 MED ORDER — CHLORHEXIDINE GLUCONATE 4 % EX LIQD: 60.0000 mL | Freq: Once | CUTANEOUS | Status: DC

## 2019-01-23 MED ORDER — OXYCODONE HCL 5 MG PO TABS
ORAL_TABLET | ORAL | Status: AC
  Filled 2019-01-23: qty 1

## 2019-01-23 MED ORDER — DOCUSATE SODIUM 100 MG PO CAPS: 100.0000 mg | ORAL_CAPSULE | Freq: Two times a day (BID) | ORAL | 0 refills | Status: DC

## 2019-01-23 MED ORDER — FENTANYL CITRATE (PF) 100 MCG/2ML IJ SOLN
INTRAMUSCULAR | Status: DC | PRN
  Administered 2019-01-23 (×6): 50 ug via INTRAVENOUS

## 2019-01-23 MED ORDER — PROPOFOL 10 MG/ML IV BOLUS
INTRAVENOUS | Status: AC
  Filled 2019-01-23: qty 20

## 2019-01-23 MED ORDER — CEFAZOLIN SODIUM-DEXTROSE 2-4 GM/100ML-% IV SOLN
2.0000 g | INTRAVENOUS | Status: AC
  Administered 2019-01-23: 2 g via INTRAVENOUS

## 2019-01-23 MED ORDER — FENTANYL CITRATE (PF) 100 MCG/2ML IJ SOLN
50.0000 ug | INTRAMUSCULAR | Status: DC | PRN
  Administered 2019-01-23: 100 ug via INTRAVENOUS

## 2019-01-23 MED ORDER — LABETALOL HCL 5 MG/ML IV SOLN
INTRAVENOUS | Status: DC | PRN
  Administered 2019-01-23: 5 mg via INTRAVENOUS

## 2019-01-23 MED ORDER — PROMETHAZINE HCL 25 MG/ML IJ SOLN: 6.2500 mg | INTRAMUSCULAR | Status: DC | PRN

## 2019-01-23 MED ORDER — PROPOFOL 10 MG/ML IV BOLUS
INTRAVENOUS | Status: DC | PRN
  Administered 2019-01-23: 200 mg via INTRAVENOUS

## 2019-01-23 MED ORDER — HYDROMORPHONE HCL 1 MG/ML IJ SOLN
INTRAMUSCULAR | Status: DC | PRN
  Administered 2019-01-23: 0.5 mg via INTRAVENOUS

## 2019-01-23 MED ORDER — CEFAZOLIN SODIUM-DEXTROSE 2-4 GM/100ML-% IV SOLN
INTRAVENOUS | Status: AC
  Filled 2019-01-23: qty 100

## 2019-01-23 MED ORDER — 0.9 % SODIUM CHLORIDE (POUR BTL) OPTIME
TOPICAL | Status: DC | PRN
  Administered 2019-01-23: 300 mL

## 2019-01-23 MED ORDER — MIDAZOLAM HCL 2 MG/2ML IJ SOLN
1.0000 mg | INTRAMUSCULAR | Status: DC | PRN
  Administered 2019-01-23: 2 mg via INTRAVENOUS

## 2019-01-23 MED ORDER — SODIUM CHLORIDE 0.9 % IV SOLN: INTRAVENOUS | Status: DC

## 2019-01-23 MED ORDER — LIDOCAINE 2% (20 MG/ML) 5 ML SYRINGE
INTRAMUSCULAR | Status: AC
  Filled 2019-01-23: qty 5

## 2019-01-23 MED ORDER — SENNA 8.6 MG PO TABS: 2.0000 | ORAL_TABLET | Freq: Two times a day (BID) | ORAL | 0 refills | Status: DC

## 2019-01-23 SURGICAL SUPPLY — 74 items
BANDAGE ESMARK 6X9 LF (GAUZE/BANDAGES/DRESSINGS) IMPLANT
BIT DRILL 3/32DIAX5INL DISPOSE (BIT) ×1 IMPLANT
BIT DRILL 3/32DX5IN DISP (BIT) ×1
BIT DRILL CANN 3.5MM (DRILL) ×1 IMPLANT
BIT TREPHINE CORING 8 (BIT) ×2 IMPLANT
BLADE AVERAGE 25X9 (BLADE) IMPLANT
BLADE MICRO SAGITTAL (BLADE) IMPLANT
BLADE SURG 15 STRL LF DISP TIS (BLADE) ×3 IMPLANT
BLADE SURG 15 STRL SS (BLADE) ×3
BNDG COHESIVE 4X5 TAN STRL (GAUZE/BANDAGES/DRESSINGS) ×2 IMPLANT
BNDG COHESIVE 6X5 TAN STRL LF (GAUZE/BANDAGES/DRESSINGS) ×2 IMPLANT
BNDG ESMARK 6X9 LF (GAUZE/BANDAGES/DRESSINGS)
CHLORAPREP W/TINT 26ML (MISCELLANEOUS) ×2 IMPLANT
COVER BACK TABLE 60X90IN (DRAPES) ×2 IMPLANT
COVER MAYO STAND STRL (DRAPES) ×2 IMPLANT
COVER WAND RF STERILE (DRAPES) IMPLANT
CUFF TOURNIQUET SINGLE 34IN LL (TOURNIQUET CUFF) ×2 IMPLANT
DRAPE EXTREMITY T 121X128X90 (DISPOSABLE) ×2 IMPLANT
DRAPE OEC MINIVIEW 54X84 (DRAPES) ×2 IMPLANT
DRAPE U-SHAPE 47X51 STRL (DRAPES) ×2 IMPLANT
DRILL BIT 3/32DIAX5INL DISPOSE (BIT) ×1
DRILL CANN 3.5MM (DRILL) ×2
DRSG MEPITEL 4X7.2 (GAUZE/BANDAGES/DRESSINGS) ×2 IMPLANT
DRSG PAD ABDOMINAL 8X10 ST (GAUZE/BANDAGES/DRESSINGS) ×4 IMPLANT
ELECT REM PT RETURN 9FT ADLT (ELECTROSURGICAL) ×2
ELECTRODE REM PT RTRN 9FT ADLT (ELECTROSURGICAL) ×1 IMPLANT
GAUZE SPONGE 4X4 12PLY STRL (GAUZE/BANDAGES/DRESSINGS) ×2 IMPLANT
GLOVE BIO SURGEON STRL SZ 6.5 (GLOVE) ×2 IMPLANT
GLOVE BIO SURGEON STRL SZ8 (GLOVE) ×2 IMPLANT
GLOVE BIOGEL PI IND STRL 7.0 (GLOVE) ×3 IMPLANT
GLOVE BIOGEL PI IND STRL 8 (GLOVE) ×2 IMPLANT
GLOVE BIOGEL PI INDICATOR 7.0 (GLOVE) ×3
GLOVE BIOGEL PI INDICATOR 8 (GLOVE) ×2
GLOVE ECLIPSE 8.0 STRL XLNG CF (GLOVE) ×2 IMPLANT
GLOVE SURG SS PI 6.5 STRL IVOR (GLOVE) ×2 IMPLANT
GOWN STRL REUS W/ TWL LRG LVL3 (GOWN DISPOSABLE) ×2 IMPLANT
GOWN STRL REUS W/ TWL XL LVL3 (GOWN DISPOSABLE) ×2 IMPLANT
GOWN STRL REUS W/TWL LRG LVL3 (GOWN DISPOSABLE) ×2
GOWN STRL REUS W/TWL XL LVL3 (GOWN DISPOSABLE) ×2
K-WIRE 9  SMOOTH .062 (WIRE) IMPLANT
KIT ARCUS SIZING TEMPLATE STRL (KITS) IMPLANT
KIT STAPLE ARCUS 16X13 STRL (Staple) ×2 IMPLANT
KWIRE SMOOTH 1.8 (WIRE) IMPLANT
NDL SAFETY ECLIPSE 18X1.5 (NEEDLE) IMPLANT
NEEDLE HYPO 18GX1.5 SHARP (NEEDLE)
NEEDLE HYPO 22GX1.5 SAFETY (NEEDLE) IMPLANT
PACK BASIN DAY SURGERY FS (CUSTOM PROCEDURE TRAY) ×2 IMPLANT
PAD CAST 4YDX4 CTTN HI CHSV (CAST SUPPLIES) ×1 IMPLANT
PADDING CAST ABS 4INX4YD NS (CAST SUPPLIES)
PADDING CAST ABS COTTON 4X4 ST (CAST SUPPLIES) IMPLANT
PADDING CAST COTTON 4X4 STRL (CAST SUPPLIES) ×1
PADDING CAST COTTON 6X4 STRL (CAST SUPPLIES) ×2 IMPLANT
PENCIL BUTTON HOLSTER BLD 10FT (ELECTRODE) ×2 IMPLANT
SANITIZER HAND PURELL 535ML FO (MISCELLANEOUS) ×2 IMPLANT
SCREW CANN 5.0X40MM (Screw) ×2 IMPLANT
SHEET MEDIUM DRAPE 40X70 STRL (DRAPES) ×2 IMPLANT
SLEEVE SCD COMPRESS KNEE MED (MISCELLANEOUS) ×2 IMPLANT
SPLINT FAST PLASTER 5X30 (CAST SUPPLIES) ×20
SPLINT PLASTER CAST FAST 5X30 (CAST SUPPLIES) ×20 IMPLANT
SPONGE LAP 18X18 RF (DISPOSABLE) ×2 IMPLANT
SPONGE SURGIFOAM ABS GEL 12-7 (HEMOSTASIS) ×2 IMPLANT
STOCKINETTE 6  STRL (DRAPES) ×1
STOCKINETTE 6 STRL (DRAPES) ×1 IMPLANT
SUCTION FRAZIER HANDLE 10FR (MISCELLANEOUS) ×1
SUCTION TUBE FRAZIER 10FR DISP (MISCELLANEOUS) ×1 IMPLANT
SUT ETHILON 3 0 PS 1 (SUTURE) ×2 IMPLANT
SUT MNCRL AB 3-0 PS2 18 (SUTURE) ×2 IMPLANT
SUT VIC AB 0 SH 27 (SUTURE) IMPLANT
SUT VIC AB 2-0 SH 27 (SUTURE) ×1
SUT VIC AB 2-0 SH 27XBRD (SUTURE) ×1 IMPLANT
SYR BULB 3OZ (MISCELLANEOUS) ×2 IMPLANT
TOWEL GREEN STERILE FF (TOWEL DISPOSABLE) ×4 IMPLANT
TUBE CONNECTING 20X1/4 (TUBING) ×2 IMPLANT
UNDERPAD 30X30 (UNDERPADS AND DIAPERS) ×2 IMPLANT

## 2019-01-23 NOTE — Progress Notes (Signed)
Assisted Dr. Sabra Heck with right, ultrasound guided, popliteal block. Side rails up, monitors on throughout procedure. See vital signs in flow sheet. Tolerated Procedure well.

## 2019-01-23 NOTE — Op Note (Signed)
01/23/2019  10:32 AM  PATIENT:  Tracy Coleman  43 y.o. female  PRE-OPERATIVE DIAGNOSIS: 1.  Right navicular fracture nonunion      2.  Right talonavicular joint arthritis  POST-OPERATIVE DIAGNOSIS:   same  Procedure(s): 1.  Repair of right tarsal navicular nonunion   2.  Autograft from right calcaneus to the tarsal navicular   3.  Arthrodesis of the right talonavicular joint   SURGEON:  Wylene Simmer, MD  ASSISTANT: Mechele Claude, PA-C  ANESTHESIA:   General, regional  EBL:  minimal   TOURNIQUET:  approx 1:15 at 409 mm Hg  COMPLICATIONS:  None apparent  DISPOSITION:  Extubated, awake and stable to recovery.  INDICATION FOR PROCEDURE: The patient is a 43 year old female with a long history of right dorsal hindfoot pain.  MRI shows a nonunion of a navicular fracture as well as degenerative changes of the talonavicular joint.  She has failed nonoperative treatment to date including activity modification, oral anti-inflammatories, bracing and shoewear modification.  She presents now for operative treatment of this painful nonunion.  The risks and benefits of the alternative treatment options have been discussed in detail.  The patient wishes to proceed with surgery and specifically understands risks of bleeding, infection, nerve damage, blood clots, need for additional surgery, amputation and death.  PROCEDURE IN DETAIL:  After pre operative consent was obtained, and the correct operative site was identified, the patient was brought to the operating room and placed supine on the OR table.  Anesthesia was administered.  Pre-operative antibiotics were administered.  A surgical timeout was taken.  The right lower extremity was prepped and draped in standard sterile fashion with a tourniquet around the thigh.  The extremity was elevated and the tourniquet was inflated to 250 mmHg.  A longitudinal incision was made over the dorsal hindfoot.  Dissection was carried down through the subcutaneous  tissues.  The extensor retinaculum was incised over the extensor houses longus.  The interval between the tibialis anterior tendon and extensor houses longus tendon was then developed taking care to protect the neurovascular bundle.  The talonavicular joint was identified.  Dorsal osteophytes were removed with a rondure and the joint was opened and carefully inspected.  The articular cartilage of the proximal navicular was significantly degenerated particularly in the area of the nonunion.  The decision was made to proceed with arthrodesis in conjunction with treatment of the nonunion.  The area of the nonunion was then cleaned of all fibrous tissue and sclerotic bone with a curette.  The remaining articular cartilage from the proximal surface of the navicular was removed with an elevator and a rondure.  The head of the talus was cleaned of remaining articular cartilage with a curette and rondure.  The wound was then irrigated copiously.  Extra attention was paid to the area of the nonunion where the drill bit was used to perforate the sclerotic bone on both sides of the nonunion.  Attention was turned to the lateral aspect of the hindfoot where an oblique incision was made over the lateral wall of the calcaneus.  Sharp dissection was carried down through the subcutaneous tissues, and the periosteum was incised.  An 8 mm trephine was used to harvest a plug of bone from the isthmus.  The graft was cleared of the cortical bone and periosteum.  The cancellus bone was then morselized and packed into the area of the nonunion with a bone impactor.  Once the area of the nonunion was filled with autograft,  the remaining graft material was packed into the talonavicular joint.  The talonavicular joint was reduced and provisionally pinned.  AP and lateral radiographs confirmed appropriate position of the guidepin.  The guidepin was then overdrilled and a 5 mm partially-threaded Biomet cannulated screw was inserted.  It was  noted to have excellent purchase and compressed the joint appropriately.  A 16 mm arcus staple was then placed across the lateral aspect of the talonavicular joint and impacted into position.  Final AP and lateral radiographs confirmed appropriate position and length of all hardware and appropriate arthrodesis of the talonavicular joint.  The wound was then irrigated copiously.  The joint capsule was repaired with 2-0 Vicryl.  The extensor retinaculum was repaired with Vicryl.  Subcutaneous tissues were approximated with Monocryl.  Skin incision was closed with nylon.  The autograft harvest site was filled with Gelfoam.  The wound was closed with nylon.  The medial stab wound was closed with nylon as well.  Sterile dressings were applied followed by a compression wrap and a well-padded short leg splint.  The tourniquet was released after application of the dressings.  The patient was awakened from anesthesia and transported to the recovery room in stable condition.   FOLLOW UP PLAN: Nonweightbearing on the right lower extremity.  Lovenox for DVT prophylaxis.  Follow-up in 2 weeks for suture removal and conversion to a short leg cast.   RADIOGRAPHS: AP, oblique and lateral radiographs of the right foot are obtained intraoperatively.  These show interval arthrodesis of the talonavicular joint and appropriate filling of the defect in the navicular.  No other acute injuries are noted.  Hardware is appropriately positioned and of the appropriate lengths.    Mechele Claude PA-C was present and scrubbed for the duration of the operative case. His assistance was essential in positioning the patient, prepping and draping, gaining and maintaining exposure, performing the operation, closing and dressing the wounds and applying the splint.

## 2019-01-23 NOTE — Transfer of Care (Signed)
Immediate Anesthesia Transfer of Care Note  Patient: Tracy Coleman  Procedure(s) Performed: Open treatment of right navicular nonunion (Right Foot)  Patient Location: PACU  Anesthesia Type:General and Regional  Level of Consciousness: awake and patient cooperative  Airway & Oxygen Therapy: Patient Spontanous Breathing and Patient connected to face mask oxygen  Post-op Assessment: Report given to RN and Post -op Vital signs reviewed and stable  Post vital signs: Reviewed and stable  Last Vitals:  Vitals Value Taken Time  BP 140/97 01/23/2019 10:54 AM  Temp    Pulse 82 01/23/2019 10:56 AM  Resp 19 01/23/2019 10:56 AM  SpO2 100 % 01/23/2019 10:56 AM  Vitals shown include unvalidated device data.  Last Pain:  Vitals:   01/23/19 0828  TempSrc:   PainSc: 0-No pain      Patients Stated Pain Goal: 3 (95/63/87 [id number])  Complications: No apparent anesthesia complications

## 2019-01-23 NOTE — H&P (Signed)
Tracy Coleman is an 43 y.o. female. Chief Complaint: Right hindfoot pain HPI: The patient is a 43 year old female with a long history of right hindfoot pain.  She has a history of a navicular fracture complicated by nonunion.  She continues to hurt with activity including standing and walking.  She has failed nonoperative treatment for this injury.  She presents today for attempted open treatment with internal fixation and bone grafting versus talonavicular joint arthrodesis.  Past Medical History:  Diagnosis Date  . Anxiety   . Arthritis   . Asthma   . Depression   . Hypertension   . Obesity   . Post traumatic stress disorder (PTSD)   . PTSD (post-traumatic stress disorder)   . PTSD (post-traumatic stress disorder)     Past Surgical History:  Procedure Laterality Date  . ARTHRODESIS METATARSALPHALANGEAL JOINT (MTPJ) Right 04/11/2018   Procedure: ARTHRODESIS METATARSALPHALANGEAL JOINT (MTPJ);  Surgeon: Wylene Simmer, MD;  Location: [facility];  Service: Orthopedics;  Laterality: Right;    Family History  Problem Relation Age of Onset  . Alcohol abuse Mother   . Drug abuse Mother   . Depression Mother   . Early death Mother        pneumonia, age 7  . Hypertension Mother   . Alcohol abuse Father   . Drug abuse Father   . Diabetes Maternal Grandmother   . Hypertension Maternal Grandmother   . Diabetes Paternal Grandmother   . Hypertension Paternal Grandmother    Social History:  reports that she has quit smoking. Her smoking use included cigarettes. She smoked 0.50 packs per day. She has never used smokeless tobacco. She reports current alcohol use. No history on file for drug.  Allergies:  Allergies  Allergen Reactions  . Varenicline Other (See Comments)    May have contributed to suicidal ideation May have contributed to suicidal ideation    Medications Prior to Admission  Medication Sig Dispense Refill  . amLODipine (NORVASC) 5 MG tablet Take 1 tablet  (5 mg total) by mouth daily. 90 tablet 3  . chlorthalidone (HYGROTON) 25 MG tablet Take 1 tablet (25 mg total) by mouth daily. 90 tablet 3  . levonorgestrel (MIRENA) 20 MCG/24HR IUD by Intrauterine route.    Marland Kitchen lisinopril (PRINIVIL,ZESTRIL) 40 MG tablet Take 1 tablet (40 mg total) by mouth daily. 90 tablet 3  . mirtazapine (REMERON) 15 MG tablet Take 1 tablet (15 mg total) by mouth at bedtime. For mood 30 tablet 0  . venlafaxine XR (EFFEXOR-XR) 37.5 MG 24 hr capsule Take 1 capsule (37.5 mg total) by mouth daily with breakfast. For mood 30 capsule 0  . albuterol (PROVENTIL HFA) 108 (90 Base) MCG/ACT inhaler Inhale into the lungs.      Results for orders placed or performed during the hospital encounter of 01/23/19 (from the past 48 hour(s))  Pregnancy, urine POC     Status: None   Collection Time: 01/23/19  7:24 AM  Result Value Ref Range   Preg Test, Ur NEGATIVE NEGATIVE    Comment:        THE SENSITIVITY OF THIS METHODOLOGY IS >24 mIU/mL    No results found.  ROS no recent fever, chills, nausea, vomiting or changes in her appetite  Blood pressure (!) 125/91, pulse 85, temperature 97.8 F (36.6 C), temperature source Oral, resp. rate 15, height 5\' 5"  (1.651 m), weight 115.8 kg, last menstrual period 01/15/2019, SpO2 100 %. Physical Exam  Well-nourished well-developed woman in no apparent distress.  Alert and oriented x4.  Mood and affect are normal.  Extraocular motions are intact.  Respirations are unlabored.  Gait is antalgic to the right.  The right foot has healthy skin.  Tender to palpation over the dorsal navicular.  No lymphadenopathy.  Pulses are palpable.  Sensibility to light touch is intact dorsally at the foot..  5 out of 5 strength in plantar flexion and dorsiflexion of the ankle and toes.  Assessment/Plan Right navicular fracture nonunion and talonavicular joint arthritis -to the operating room for open treatment of the navicular nonunion and possible talonavicular joint  arthrodesis based on the appearance of the joint.  The risks and benefits of the alternative treatment options have been discussed in detail.  The patient wishes to proceed with surgery and specifically understands risks of bleeding, infection, nerve damage, blood clots, need for additional surgery, amputation and death.   Wylene Simmer, MD 02-02-19, 8:49 AM

## 2019-01-23 NOTE — Anesthesia Preprocedure Evaluation (Signed)
Anesthesia Evaluation  Patient identified by MRN, date of birth, ID band Patient awake    Reviewed: Allergy & Precautions, NPO status , Patient's Chart, lab work & pertinent test results  Airway Mallampati: II  TM Distance: >3 FB Neck ROM: Full    Dental no notable dental hx. (+) Dental Advisory Given   Pulmonary asthma , former smoker,    Pulmonary exam normal breath sounds clear to auscultation       Cardiovascular hypertension, Pt. on medications Normal cardiovascular exam Rhythm:Regular Rate:Normal     Neuro/Psych PSYCHIATRIC DISORDERS Anxiety Depression    GI/Hepatic negative GI ROS, Neg liver ROS,   Endo/Other  Morbid obesity  Renal/GU negative Renal ROS     Musculoskeletal  (+) Arthritis ,   Abdominal (+) + obese,   Peds  Hematology   Anesthesia Other Findings   Reproductive/Obstetrics                             EKG: normal sinus rhythm.  Anesthesia Physical  Anesthesia Plan  ASA: III  Anesthesia Plan: General   Post-op Pain Management:  Regional for Post-op pain   Induction: Intravenous  PONV Risk Score and Plan: 3 and Ondansetron, Dexamethasone and Midazolam  Airway Management Planned: LMA  Additional Equipment: None  Intra-op Plan:   Post-operative Plan: Extubation in OR  Informed Consent: I have reviewed the patients History and Physical, chart, labs and discussed the procedure including the risks, benefits and alternatives for the proposed anesthesia with the patient or authorized representative who has indicated his/her understanding and acceptance.     Dental advisory given  Plan Discussed with: CRNA and Anesthesiologist  Anesthesia Plan Comments:         Anesthesia Quick Evaluation

## 2019-01-23 NOTE — Anesthesia Procedure Notes (Signed)
Procedure Name: LMA Insertion Date/Time: 01/23/2019 9:03 AM Performed by: Gwyndolyn Saxon, CRNA Pre-anesthesia Checklist: Patient identified, Emergency Drugs available, Suction available and Patient being monitored Patient Re-evaluated:Patient Re-evaluated prior to induction Oxygen Delivery Method: Circle system utilized Preoxygenation: Pre-oxygenation with 100% oxygen Induction Type: IV induction Ventilation: Mask ventilation without difficulty LMA: LMA inserted LMA Size: 4.0 Number of attempts: 1 Airway Equipment and Method: Patient positioned with wedge pillow Placement Confirmation: positive ETCO2 and breath sounds checked- equal and bilateral Tube secured with: Tape Dental Injury: Teeth and Oropharynx as per pre-operative assessment

## 2019-01-23 NOTE — Anesthesia Postprocedure Evaluation (Signed)
Anesthesia Post Note  Patient: [employer]  Procedure(s) Performed: Open treatment of right navicular nonunion (Right Foot)     Patient location during evaluation: PACU Anesthesia Type: General Level of consciousness: awake and alert Pain management: pain level controlled Vital Signs Assessment: post-procedure vital signs reviewed and stable Respiratory status: spontaneous breathing, nonlabored ventilation and respiratory function stable Cardiovascular status: blood pressure returned to baseline and stable Postop Assessment: no apparent nausea or vomiting Anesthetic complications: no    Last Vitals:  Vitals:   01/23/19 1207 01/23/19 1230  BP:  (!) 142/100  Pulse: 87 93  Resp: 20 18  Temp:  37.1 C  SpO2: 100% 100%    Last Pain:  Vitals:   01/23/19 1150  TempSrc:   PainSc: [place]

## 2019-01-23 NOTE — Anesthesia Procedure Notes (Signed)
Anesthesia Regional Block: Popliteal block   Pre-Anesthetic Checklist: ,, timeout performed, Correct Patient, Correct Site, Correct Laterality, Correct Procedure, Correct Position, site marked, Risks and benefits discussed,  Surgical consent,  Pre-op evaluation,  At surgeon's request and post-op pain management  Laterality: Right  Prep: chloraprep       Needles:  Injection technique: Single-shot  Needle Type: Stimiplex     Needle Length: 9cm  Needle Gauge: 21     Additional Needles:   Procedures:,,,, ultrasound used (permanent image in chart),,,,  Narrative:  Start time: 01/23/2019 8:25 AM End time: 01/23/2019 8:30 AM Injection made incrementally with aspirations every 5 mL.  Performed by: Personally  Anesthesiologist: Lynda Rainwater, MD

## 2019-01-23 NOTE — Discharge Instructions (Addendum)
Tracy Simmer, MD [place]  Please read the following information regarding your care after surgery.  Medications  You only need a prescription for the narcotic pain medicine (ex. oxycodone, Percocet, Norco).  All of the other medicines listed below are available over the counter. X Aleve 2 pills twice a day for the first 3 days after surgery. X acetominophen (Tylenol) 650 mg every 4-6 hours as you need for minor to moderate pain X oxycodone as prescribed for severe pain  Narcotic pain medicine (ex. oxycodone, Percocet, Vicodin) will cause constipation.  To prevent this problem, take the following medicines while you are taking any pain medicine. X docusate sodium (Colace) 100 mg twice a day X senna (Senokot) 2 tablets twice a day  X To help prevent blood clots, inject lovenox as prescribed daily after surgery.  You should also get up every hour while you are awake to move around.    Weight Bearing X Do not bear any weight on the operated leg or foot.  Cast / Splint / Dressing X Keep your splint, cast or dressing clean and dry.  Dont put anything (coat hanger, pencil, etc) down inside of it.  If it gets damp, use a hair dryer on the cool setting to dry it.  If it gets soaked, call the office to schedule an appointment for a cast change.   After your dressing, cast or splint is removed; you may shower, but do not soak or scrub the wound.  Allow the water to run over it, and then gently pat it dry.  Swelling It is normal for you to have swelling where you had surgery.  To reduce swelling and pain, keep your toes above your nose for at least 3 days after surgery.  It may be necessary to keep your foot or leg elevated for several weeks.  If it hurts, it should be elevated.  Follow Up Call my office at [phone] when you are discharged from the hospital or surgery center to schedule an appointment to be seen two weeks after surgery.  Call my office at [phone] if you  develop a fever >101.5 F, nausea, vomiting, bleeding from the surgical site or severe pain.      Post Anesthesia Home Care Instructions  Activity: Get plenty of rest for the remainder of the day. A responsible individual must stay with you for 24 hours following the procedure.  For the next 24 hours, DO NOT: -Drive a car -Paediatric nurse -Drink alcoholic beverages -Take any medication unless instructed by your physician -Make any legal decisions or sign important papers.  Meals: Start with liquid foods such as gelatin or soup. Progress to regular foods as tolerated. Avoid greasy, spicy, heavy foods. If nausea and/or vomiting occur, drink only clear liquids until the nausea and/or vomiting subsides. Call your physician if vomiting continues.  Special Instructions/Symptoms: Your throat may feel dry or sore from the anesthesia or the breathing tube placed in your throat during surgery. If this causes discomfort, gargle with warm salt water. The discomfort should disappear within 24 hours.  If you had a scopolamine patch placed behind your ear for the management of post- operative nausea and/or vomiting:  1. The medication in the patch is effective for 72 hours, after which it should be removed.  Wrap patch in a tissue and discard in the trash. Wash hands thoroughly with soap and water. 2. You may remove the patch earlier than 72 hours if you experience unpleasant side effects which may  include dry mouth, dizziness or visual disturbances. 3. Avoid touching the patch. Wash your hands with soap and water after contact with the patch.     Regional Anesthesia Blocks  1. Numbness or the inability to move the "blocked" extremity may last from 3-48 hours after placement. The length of time depends on the medication injected and your individual response to the medication. If the numbness is not going away after 48 hours, call your surgeon.  2. The extremity that is blocked will need to be  protected until the numbness is gone and the  Strength has returned. Because you cannot feel it, you will need to take extra care to avoid injury. Because it may be weak, you may have difficulty moving it or using it. You may not know what position it is in without looking at it while the block is in effect.  3. For blocks in the legs and feet, returning to weight bearing and walking needs to be done carefully. You will need to wait until the numbness is entirely gone and the strength has returned. You should be able to move your leg and foot normally before you try and bear weight or walk. You will need someone to be with you when you first try to ensure you do not fall and possibly risk injury.  4. Bruising and tenderness at the needle site are common side effects and will resolve in a few days.  5. Persistent numbness or new problems with movement should be communicated to the surgeon or the [place] [phone] [place] [phone]).

## 2019-01-24 ENCOUNTER — Encounter (HOSPITAL_BASED_OUTPATIENT_CLINIC_OR_DEPARTMENT_OTHER): Payer: Self-pay | Admitting: Orthopedic Surgery

## 2019-01-29 ENCOUNTER — Encounter: Payer: Self-pay | Admitting: Obstetrics and Gynecology

## 2019-01-29 ENCOUNTER — Other Ambulatory Visit (HOSPITAL_COMMUNITY)
Admission: RE | Admit: 2019-01-29 | Discharge: 2019-01-29 | Disposition: A | Discharge status: Home / Self Care | Payer: Medicaid Other | Source: Ambulatory Visit | Attending: Obstetrics and Gynecology | Admitting: Obstetrics and Gynecology

## 2019-01-29 ENCOUNTER — Ambulatory Visit: Payer: Medicaid Other | Admitting: Obstetrics and Gynecology

## 2019-01-29 VITALS — BP 119/79 | HR 83 | Wt 265.0 lb

## 2019-01-29 DIAGNOSIS — N882 Stricture and stenosis of cervix uteri: Secondary | ICD-10-CM | POA: Diagnosis not present

## 2019-01-29 DIAGNOSIS — T8332XA Displacement of intrauterine contraceptive device, initial encounter: Secondary | ICD-10-CM | POA: Diagnosis not present

## 2019-01-29 DIAGNOSIS — Z124 Encounter for screening for malignant neoplasm of cervix: Secondary | ICD-10-CM

## 2019-01-29 DIAGNOSIS — N879 Dysplasia of cervix uteri, unspecified: Secondary | ICD-10-CM | POA: Diagnosis not present

## 2019-01-29 NOTE — Progress Notes (Signed)
Obstetrics and Gynecology New Patient Evaluation  Appointment Date: 01/29/2019  OBGYN Clinic: [facility]  Primary Care Provider: Rory Percy  Referring Provider: Alveda Reasons, MD  Chief Complaint: lost IUD, abnormal pap  History of Present Illness: Tracy Coleman is a 43 y.o. African-American G2P1011 (Patient's last menstrual period was 01/15/2019.), seen for the above chief complaint. Her past medical history is significant for h/o LEEP x 2, BMI 40s, depression, HTN  Patient seen by PCP on 1/13 and IUD strings not seen and cervix incompletely visualized, so patient referred to GYN for follow up; no u/s ordered. Pap was ASCUS/HPV negative but no EC/TZ component.   Patient had her current Mirena placed in 2013 and this is her 2nd mirena in a row that she's had. She states that she had a LEEP at the same time the Mirena was placed in [place]. Pt states she has the iud for contraception.   She is amenorrheic on it and denies any monthly/cyclic cramping or pain. Also no VB, discharge, itching or pain with sex.    Review of Systems:as noted in the History of Present Illness.  Patient Active Problem List   Diagnosis Date Noted  . Cervical stenosis (uterine cervix) 01/29/2019  . IUD strings lost 01/29/2019  . Cervical dysplasia 01/29/2019  . MDD (major depressive disorder), severe ([place]) 01/03/2019  . Contraception management 12/23/2018  . Pain in joint of right ankle 10/18/2018  . Acquired hallux rigidus 10/18/2018  . 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 Essential hypertension 05/03/2013  . Obesity 05/03/2013  . Anxiety 05/03/2013  . Depression 05/03/2013    Past Medical History:  Past Medical History:  Diagnosis Date  . Anxiety   . Arthritis   . Asthma   . Depression   . Hypertension   . Obesity   . PTSD (post-traumatic stress disorder)     Past Surgical History:  Past Surgical History:  Procedure Laterality Date  . ARTHRODESIS  METATARSALPHALANGEAL JOINT (MTPJ) Right 04/11/2018   Procedure: ARTHRODESIS METATARSALPHALANGEAL JOINT (MTPJ);  Surgeon: Wylene Simmer, MD;  Location: [facility];  Service: Orthopedics;  Laterality: Right;  . FOOT ARTHRODESIS Right 01/23/2019   Procedure: Open treatment of right navicular nonunion;  Surgeon: Wylene Simmer, MD;  Location: [facility];  Service: Orthopedics;  Laterality: Right;  . LEEP     x2. Last one 2013    Past Obstetrical History:  OB History  Gravida Para Term Preterm AB Living  2 1 1   1 1   SAB TAB Ectopic Multiple Live Births  1       1    # Outcome Date GA Lbr Len/2nd Weight Sex Delivery Anes PTL Lv  2 Term 2004     Vag-Spont     1 SAB             Past Gynecological History: As per HPI.  Social History:  Social History   Socioeconomic History  . Marital status: Married    Spouse name: Not on file  . Number of children: Not on file  . Years of education: Not on file  . Highest education level: Not on file  Occupational History  . Not on file  Social Needs  . Financial resource strain: Not on file  . Food insecurity:    Worry: Not on file    Inability: Not on file  . Transportation needs:    Medical: Not on file    Non-medical: Not on file  Tobacco Use  . Smoking status: Former Smoker    Packs/day: 0.50    Types: Cigarettes  . Smokeless tobacco: Never Used  . Tobacco comment: Stopped smoking for surgery 01/23/19  Substance and Sexual Activity  . Alcohol use: Yes    Frequency: Never    Comment: occasional 2-3 drinks/week  . 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 Sexual activity: Yes    Birth control/protection: I.U.D.  Lifestyle  . Physical activity:    Days per week: Not on file    Minutes per session: Not on file  . Stress: Not on file  Relationships  . Social connections:    Talks on phone: Not on file    Gets together: Not on file    Attends religious  service: Not on file    Active member of club or organization: Not on file    Attends meetings of clubs or organizations: Not on file    Relationship status: Not on file  . Intimate partner violence:    Fear of current or ex partner: Not on file    Emotionally abused: Not on file    Physically abused: Not on file    Forced sexual activity: Not on file  Other Topics Concern  . Not on file  Social History Narrative  . Not on file    Family History:  Family History  Problem Relation Age of Onset  . Alcohol abuse Mother   . Drug abuse Mother   . Depression Mother   . Early death Mother        pneumonia, age 42  . Hypertension Mother   . Alcohol abuse Father   . Drug abuse Father   . Diabetes Maternal Grandmother   . Hypertension Maternal Grandmother   . Diabetes Paternal Grandmother   . Hypertension Paternal Grandmother     Medications Bich Myren had no medications administered during this visit. Current Outpatient Medications  Medication Sig Dispense Refill  . albuterol (PROVENTIL HFA) 108 (90 Base) MCG/ACT inhaler Inhale into the lungs.    Marland Kitchen amLODipine (NORVASC) 5 MG tablet Take 1 tablet (5 mg total) by mouth daily. 90 tablet 3  . chlorthalidone (HYGROTON) 25 MG tablet Take 1 tablet (25 mg total) by mouth daily. 90 tablet 3  . docusate sodium (COLACE) 100 MG capsule Take 1 capsule (100 mg total) by mouth 2 (two) times daily. While taking narcotic pain medicine. 30 capsule 0  . ketorolac (TORADOL) 10 MG tablet ketorolac 10 mg tablet  Take 1 tablet every 6 hours by oral route for 5 days.    Marland Kitchen levonorgestrel (MIRENA) 20 MCG/24HR IUD by Intrauterine route.    Marland Kitchen lisinopril (PRINIVIL,ZESTRIL) 40 MG tablet Take 1 tablet (40 mg total) by mouth daily. 90 tablet 3  . methocarbamol (ROBAXIN) 500 MG tablet methocarbamol 500 mg tablet  Take 1 tablet every 6 hours by oral route.    . mirtazapine (REMERON) 15 MG tablet Take 1 tablet (15 mg total) by mouth at bedtime. For mood 30  tablet 0  . venlafaxine XR (EFFEXOR-XR) 37.5 MG 24 hr capsule Take 1 capsule (37.5 mg total) by mouth daily with breakfast. For mood 30 capsule 0  . enoxaparin (LOVENOX) 40 MG/0.4ML injection Inject 0.4 mLs (40 mg total) into the skin daily. (Patient not taking: Reported on 01/29/2019) 6 mL 0  . senna (SENOKOT) 8.6 MG TABS tablet Take 2 tablets (17.2  mg total) by mouth 2 (two) times daily. (Patient not taking: Reported on 01/29/2019) 30 each 0   No current facility-administered medications for this visit.     Allergies Varenicline   Physical Exam:  BP 119/79   Pulse 83   Wt 265 lb (120.2 kg)   LMP 01/15/2019 Comment: urine pregnancy test negative 01/23/19  BMI 44.10 kg/m  Body mass index is 44.1 kg/m. General appearance: Well nourished, well developed female in no acute distress.  Respiratory:  Normal respiratory effort Abdomen: diffusely non tender to palpation, non distended Neuro/Psych:  Normal mood and affect.  Skin:  Warm and dry.  Lymphatic:  No inguinal lymphadenopathy.   Pelvic exam: is not limited by body habitus EGBUS: within normal limits With normal graves speculum, cervix visualized. Vaginal vault normal with no d/c or bleeding. Cervix seen and no IUD strings noted. Spatula and then cytobrush for pap done and unable to push cytobrush into os. Tenaculum applied to anterior lip of cervix and cytobrush again used and still unable to push into cervix.  Uterus: small, nttp.  Adnexa: negative  Laboratory: none  Radiology: none  Assessment: pt stable.   Plan: 1. Cervical cancer screening Repeat pap done since no ec/tz seen and slightly abnormal - Cytology - PAP( [place])  2. Cervical stenosis (uterine cervix) I told her that usually you would do an IUD insertion after the LEEP results are back and not at the same time as a LEEP but it could very well have occurred like that. I told her that I recommend an u/s just to make sure the IUD is where it should be. If the  u/s is negative, she'll need to go to the OR for cervical dilation, hysteroscopy and IUD removal. I told her that she may also need a colpo in the OR too, depending on what the pap showed and that could place IUD at the same time, which could be an option to help keep the tract open, as long as the colpo doesn't show any e/o of high grade features. Recommend back up method for [facility] since the Mirena is technically expired.  - US PELVIC COMPLETE WITH TRANSVAGINAL; Future  3. Intrauterine contraceptive device threads lost, initial encounter  4. Cervical dysplasia  Orders Placed This Encounter  Procedures  . US PELVIC COMPLETE WITH TRANSVAGINAL    RTC will call pt after results.   Durene Romans MD Attending [facility] [employer])

## 2019-01-31 LAB — CYTOLOGY - PAP
Adequacy: ABSENT
Bacterial vaginitis: POSITIVE — AB
CANDIDA VAGINITIS: NEGATIVE
Chlamydia: NEGATIVE
DIAGNOSIS: NEGATIVE
HPV: NOT DETECTED
Neisseria Gonorrhea: NEGATIVE
Trichomonas: NEGATIVE

## 2019-02-03 ENCOUNTER — Emergency Department (HOSPITAL_COMMUNITY)
Admission: EM | Admit: 2019-02-03 | Discharge: 2019-02-04 | Disposition: A | Discharge status: Home / Self Care | Payer: Medicaid Other | Attending: Emergency Medicine | Admitting: Emergency Medicine

## 2019-02-03 ENCOUNTER — Emergency Department (HOSPITAL_COMMUNITY): Payer: Medicaid Other

## 2019-02-03 ENCOUNTER — Encounter (HOSPITAL_COMMUNITY): Payer: Self-pay

## 2019-02-03 ENCOUNTER — Other Ambulatory Visit: Payer: Self-pay

## 2019-02-03 DIAGNOSIS — I10 Essential (primary) hypertension: Secondary | ICD-10-CM | POA: Diagnosis not present

## 2019-02-03 DIAGNOSIS — R0602 Shortness of breath: Secondary | ICD-10-CM | POA: Diagnosis not present

## 2019-02-03 DIAGNOSIS — R0789 Other chest pain: Secondary | ICD-10-CM | POA: Insufficient documentation

## 2019-02-03 DIAGNOSIS — Z79899 Other long term (current) drug therapy: Secondary | ICD-10-CM | POA: Diagnosis not present

## 2019-02-03 DIAGNOSIS — Z87891 Personal history of nicotine dependence: Secondary | ICD-10-CM | POA: Insufficient documentation

## 2019-02-03 DIAGNOSIS — J45909 Unspecified asthma, uncomplicated: Secondary | ICD-10-CM | POA: Diagnosis not present

## 2019-02-03 LAB — I-STAT TROPONIN, ED: Troponin i, poc: 0 ng/mL (ref 0.00–0.08)

## 2019-02-03 LAB — I-STAT BETA HCG BLOOD, ED (MC, WL, AP ONLY): I-stat hCG, quantitative: <5 m[IU]/mL (ref <5)

## 2019-02-03 LAB — CBC
HCT: 36 % (ref 36.0–46.0)
Hemoglobin: 11.9 g/dL — ABNORMAL LOW (ref 12.0–15.0)
MCH: 31.2 pg (ref 26.0–34.0)
MCHC: 33.1 g/dL (ref 30.0–36.0)
MCV: 94.2 fL (ref 80.0–100.0)
Platelets: 445 10*3/uL — ABNORMAL HIGH (ref 150–400)
RBC: 3.82 MIL/uL — AB (ref 3.87–5.11)
RDW: 13.8 % (ref 11.5–15.5)
WBC: 11.2 10*3/uL — ABNORMAL HIGH (ref 4.0–10.5)
nRBC: 0 % (ref 0.0–0.2)

## 2019-02-03 MED ORDER — SODIUM CHLORIDE 0.9% FLUSH: 3.0000 mL | Freq: Once | INTRAVENOUS | Status: DC

## 2019-02-03 NOTE — ED Triage Notes (Signed)
Pt here for central chest pain that radiates to the right arm.  States when moving right arm the pain gets worse.  Recent right foot surgery, no complaints from that.

## 2019-02-04 ENCOUNTER — Emergency Department (HOSPITAL_COMMUNITY): Payer: Medicaid Other

## 2019-02-04 LAB — BASIC METABOLIC PANEL
ANION GAP: 10 (ref 5–15)
BUN: 15 mg/dL (ref 6–20)
CO2: 23 mmol/L (ref 22–32)
Calcium: 9.3 mg/dL (ref 8.9–10.3)
Chloride: 106 mmol/L (ref 98–111)
Creatinine, Ser: 0.85 mg/dL (ref 0.44–1.00)
GFR calc Af Amer: >60 mL/min (ref >60)
GFR calc non Af Amer: >60 mL/min (ref >60)
Glucose, Bld: 124 mg/dL — ABNORMAL HIGH (ref 70–99)
Potassium: 3.8 mmol/L (ref 3.5–5.1)
Sodium: 139 mmol/L (ref 135–145)

## 2019-02-04 LAB — I-STAT TROPONIN, ED: Troponin i, poc: 0 ng/mL (ref 0.00–0.08)

## 2019-02-04 LAB — D-DIMER, QUANTITATIVE (NOT AT ARMC): D DIMER QUANT: 0.68 ug{FEU}/mL — AB (ref 0.00–0.50)

## 2019-02-04 MED ORDER — IOPAMIDOL (ISOVUE-370) INJECTION 76%
100.0000 mL | Freq: Once | INTRAVENOUS | Status: AC | PRN
  Administered 2019-02-04: 100 mL via INTRAVENOUS

## 2019-02-04 MED ORDER — NAPROXEN 500 MG PO TABS: 500.0000 mg | ORAL_TABLET | Freq: Two times a day (BID) | ORAL | 0 refills | Status: DC | PRN

## 2019-02-04 MED ORDER — KETOROLAC TROMETHAMINE 30 MG/ML IJ SOLN
30.0000 mg | Freq: Once | INTRAMUSCULAR | Status: AC
  Administered 2019-02-04: 30 mg via INTRAVENOUS
  Filled 2019-02-04: qty 1

## 2019-02-04 MED ORDER — IOPAMIDOL (ISOVUE-370) INJECTION 76%
INTRAVENOUS | Status: AC
  Filled 2019-02-04: qty 100

## 2019-02-04 NOTE — ED Notes (Signed)
Patient consented to allow this nurse to start an IV. Meds given and CT notified that pt ready for scan.

## 2019-02-04 NOTE — ED Provider Notes (Signed)
[facility] EMERGENCY DEPARTMENT Provider Note   CSN: [id number] Arrival date & time: 02/03/19  2315    History   Chief Complaint Chief Complaint  Patient presents with  . Chest Pain    HPI Tracy Coleman is a 43 y.o. female.     Patient with history of asthma, obesity, recent foot surgery on February 13 presenting with right-sided and central chest pain for the past 4 days.  She reports pain and pressure to the right side of her chest that is constant. It has been unrelieved with her pain medication at home that she has been taking for her foot as well as Robaxin.  States she is out of the 30 oxycodone she was given on February 13.  Today she became concerned because the pain is moving down her right arm and is worse with palpation, deep breathing and movement.  States she does have some shortness of breath.  She has had a slight cough but no fever.  No nausea, vomiting, diaphoresis or syncope.  Denies any cardiac history.  She does have a Mirena IUD.  She did have foot surgery on February 13 for a previous malunion which she states is doing well.  She has no history of blood clots.   Chest Pain  Shooting:     Associated symptoms: shortness of breath   Associated symptoms: no abdominal pain, no dizziness, no headache, no nausea, no vomiting and no weakness     Past Medical History:  Diagnosis Date  . Anxiety   . Arthritis   . Asthma   . Depression   . Hypertension   . Obesity   . PTSD (post-traumatic stress disorder)     Patient Active Problem List   Diagnosis Date Noted  . Cervical stenosis (uterine cervix) 01/29/2019  . IUD strings lost 01/29/2019  . Cervical dysplasia 01/29/2019  . MDD (major depressive disorder), severe ([place]) 01/03/2019  . Contraception management 12/23/2018  . Pain in joint of right ankle 10/18/2018  . Acquired hallux rigidus 10/18/2018  . 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 Essential hypertension 05/03/2013  . Obesity  05/03/2013  . Anxiety 05/03/2013  . Depression 05/03/2013    Past Surgical History:  Procedure Laterality Date  . ARTHRODESIS METATARSALPHALANGEAL JOINT (MTPJ) Right 04/11/2018   Procedure: ARTHRODESIS METATARSALPHALANGEAL JOINT (MTPJ);  Surgeon: Wylene Simmer, MD;  Location: [facility];  Service: Orthopedics;  Laterality: Right;  . FOOT ARTHRODESIS Right 01/23/2019   Procedure: Open treatment of right navicular nonunion;  Surgeon: Wylene Simmer, MD;  Location: [facility];  Service: Orthopedics;  Laterality: Right;  . LEEP     x2. Last one 2013     OB History    Gravida  2   Para  1   Term  1   Preterm      AB  1   Living  1     SAB  1   TAB      Ectopic      Multiple      Live Births  1            Home Medications    Prior to Admission medications   Medication Sig Start Date End Date Taking? Authorizing Provider  albuterol (PROVENTIL HFA) 108 (90 Base) MCG/ACT inhaler Inhale into the lungs. 04/30/13   [provider]  amLODipine (NORVASC) 5 MG tablet Take 1 tablet (5 mg total) by mouth daily. 09/20/18   Rory Percy, DO  chlorthalidone (HYGROTON) 25 MG tablet Take 1 tablet (25 mg total) by mouth daily. 09/20/18   Rory Percy, DO  docusate sodium (COLACE) 100 MG capsule Take 1 capsule (100 mg total) by mouth 2 (two) times daily. While taking narcotic pain medicine. 01/23/19   Corky Sing, PA-C  enoxaparin (LOVENOX) 40 MG/0.4ML injection Inject 0.4 mLs (40 mg total) into the skin daily. Patient not taking: Reported on 01/29/2019 01/23/19   Corky Sing, PA-C  ketorolac (TORADOL) 10 MG tablet ketorolac 10 mg tablet  Take 1 tablet every 6 hours by oral route for 5 days.    [provider]  levonorgestrel (MIRENA) 20 MCG/24HR IUD by Intrauterine route.    [provider]  lisinopril (PRINIVIL,ZESTRIL) 40 MG tablet Take 1 tablet (40 mg total) by mouth daily. 09/20/18   Rory Percy, DO    methocarbamol (ROBAXIN) 500 MG tablet methocarbamol 500 mg tablet  Take 1 tablet every 6 hours by oral route.    [provider]  mirtazapine (REMERON) 15 MG tablet Take 1 tablet (15 mg total) by mouth at bedtime. For mood 01/06/19   Connye Burkitt, NP  senna (SENOKOT) 8.6 MG TABS tablet Take 2 tablets (17.2 mg total) by mouth 2 (two) times daily. Patient not taking: Reported on 01/29/2019 01/23/19   Corky Sing, PA-C  venlafaxine XR (EFFEXOR-XR) 37.5 MG 24 hr capsule Take 1 capsule (37.5 mg total) by mouth daily with breakfast. For mood 01/07/19   Connye Burkitt, NP    Family History Family History  Problem Relation Age of Onset  . Alcohol abuse Mother   . Drug abuse Mother   . Depression Mother   . Early death Mother        pneumonia, age 105  . Hypertension Mother   . Alcohol abuse Father   . Drug abuse Father   . Diabetes Maternal Grandmother   . Hypertension Maternal Grandmother   . Diabetes Paternal Grandmother   . Hypertension Paternal Grandmother     Social History Social History   Tobacco Use  . Smoking status: Former Smoker    Packs/day: 0.50    Types: Cigarettes  . Smokeless tobacco: Never Used  . Tobacco comment: Stopped smoking for surgery 01/23/19  Substance Use Topics  . Alcohol use: Yes    Frequency: Never    Comment: occasional 2-3 drinks/week  . 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  HENT: Negative for congestion and rhinorrhea.   Respiratory: Positive for chest tightness and shortness of breath.   Cardiovascular: Positive for chest pain.  Gastrointestinal: Negative for abdominal pain, nausea and vomiting.  Genitourinary: Negative for dysuria and hematuria.  Musculoskeletal: Negative for arthralgias and myalgias.  Neurological: Negative for  dizziness, weakness and headaches.   all other systems are negative except as noted in the HPI and PMH.     Physical Exam Updated Vital Signs BP 118/78   Pulse 78   Temp 98.1 F (36.7 C) (Oral)   Resp 15   Ht 5\' 5"  (1.651 m)   Wt 120.2 kg   LMP 01/15/2019 Comment: urine pregnancy test negative 01/23/19  SpO2 99%   BMI 44.10 kg/m   Physical Exam Vitals signs and nursing note reviewed.  Constitutional:  General: She is not in acute distress.    Appearance: She is well-developed.  HENT:     Head: Normocephalic and atraumatic.     Mouth/Throat:     Pharynx: No oropharyngeal exudate.  Eyes:     Conjunctiva/sclera: Conjunctivae normal.     Pupils: Pupils are equal, round, and reactive to light.  Neck:     Musculoskeletal: Normal range of motion and neck supple.     Comments: No meningismus. Cardiovascular:     Rate and Rhythm: Normal rate and regular rhythm.     Heart sounds: Normal heart sounds. No murmur.  Pulmonary:     Effort: Pulmonary effort is normal. No respiratory distress.     Breath sounds: Normal breath sounds.     Comments: There is central and right-sided chest wall tenderness that is worse with palpation and right arm movement Chest:     Chest wall: Tenderness present.  Abdominal:     Palpations: Abdomen is soft.     Tenderness: There is no abdominal tenderness. There is no guarding or rebound.  Musculoskeletal: Normal range of motion.        General: No tenderness.     Comments: Equal radial pulses and grip strengths.  Right lower leg in cast.  Skin:    General: Skin is warm.     Capillary Refill: Capillary refill takes less than 2 seconds.  Neurological:     General: No focal deficit present.     Mental Status: She is alert and oriented to person, place, and time. Mental status is at baseline.     Cranial Nerves: No cranial nerve deficit.     Motor: No abnormal muscle tone.     Coordination: Coordination normal.     Comments: No ataxia on  finger to nose bilaterally. No pronator drift. 5/5 strength throughout. CN 2-12 intact.Equal grip strength. Sensation intact.   Psychiatric:        Behavior: Behavior normal.      ED Treatments / Results  Labs (all labs ordered are listed, but only abnormal results are displayed) Labs Reviewed  BASIC METABOLIC PANEL - Abnormal; Notable for the following components:      Result Value   Glucose, Bld 124 (*)    All other components within normal limits  CBC - Abnormal; Notable for the following components:   WBC 11.2 (*)    RBC 3.82 (*)    Hemoglobin 11.9 (*)    Platelets 445 (*)    All other components within normal limits  D-DIMER, QUANTITATIVE (NOT AT [facility]) - Abnormal; Notable for the following components:   D-Dimer, Quant 0.68 (*)    All other components within normal limits  I-STAT TROPONIN, ED  I-STAT BETA HCG BLOOD, ED ([facility], [facility], [facility] ONLY)  I-STAT TROPONIN, ED    EKG EKG Interpretation  Date/Time:  Monday February 03 2019 23:19:32 EST Ventricular Rate:  106 PR Interval:  174 QRS Duration: 74 QT Interval:  328 QTC Calculation: 435 R Axis:   -3 Text Interpretation:  Sinus tachycardia Otherwise normal ECG No significant change was found Confirmed by Ezequiel Essex [phone]) on 02/04/2019 3:11:17 AM   Radiology Dg Chest 2 View  Result Date: 02/03/2019 CLINICAL DATA:  Chest pain EXAM: CHEST - 2 VIEW COMPARISON:  12/17/2018 FINDINGS: Heart is borderline in size. Lungs clear. No effusions or acute bony abnormality. IMPRESSION: No active cardiopulmonary disease. Electronically Signed   By: Rolm Baptise M.D.   On: 02/03/2019 23:47   Ct  Angio Chest Pe W And/or Wo Contrast  Result Date: 02/04/2019 CLINICAL DATA:  43 year old female with chest pain. Concern for pulmonary embolism. EXAM: CT ANGIOGRAPHY CHEST WITH CONTRAST TECHNIQUE: Multidetector CT imaging of the chest was performed using the standard protocol during bolus administration of intravenous contrast. Multiplanar CT  image reconstructions and MIPs were obtained to evaluate the vascular anatomy. CONTRAST:  <See Chart> ISOVUE-370 IOPAMIDOL (ISOVUE-370) INJECTION 76% COMPARISON:  Chest radiograph dated 02/03/2019 FINDINGS: Cardiovascular: There is mild cardiomegaly. No pericardial effusion. The thoracic aorta is unremarkable. There is no CT evidence of pulmonary embolism. Mediastinum/Nodes: No hilar or mediastinal adenopathy. Esophagus and the thyroid gland are grossly unremarkable. No mediastinal fluid collection. Lungs/Pleura: There are areas of air trapping within the lung. There is no focal consolidation, pleural effusion, or pneumothorax. The central airways are patent. Upper Abdomen: No acute abnormality. Musculoskeletal: No chest wall abnormality. No acute or significant osseous findings. Review of the MIP images confirms the above findings. IMPRESSION: No acute intrathoracic pathology. No CT evidence of pulmonary embolism. Electronically Signed   By: Anner Crete M.D.   On: 02/04/2019 05:43    Procedures Procedures (including critical care time)  Medications Ordered in ED Medications  sodium chloride flush (NS) 0.9 % injection 3 mL (has no administration in time range)  ketorolac (TORADOL) 30 MG/ML injection 30 mg (has no administration in time range)  iopamidol (ISOVUE-370) 76 % injection (has no administration in time range)     Initial Impression / Assessment and Plan / ED Course  I have reviewed the triage vital signs and the nursing notes.  Pertinent labs & imaging results that were available during my care of the patient were reviewed by me and considered in my medical decision making (see chart for details).       4 days of constant chest pain worse on the right side worse with movement and not deep breathing.  Recent foot surgery.  Pain seems atypical for ACS.  It is worse with palpation and movement.  EKG is nonischemic.  Will obtain d-dimer given her recent foot surgery.  D-dimer is  positive.  Subsequent CT PE is negative. Troponin negative x2.  Pain is been ongoing for the past 4 days and is reproducible.  Very atypical for ACS.  Discussed treating this pain with anti-inflammatories.  Suggested that this pain may be due to patient using her crutches which she states is not the case and she does started using them yesterday and was using a walker before this.  Patient upset throughout her ED stay.  States the nurse placed through the IV inappropriately in her arm and the patient wanted in her other arm.  She is also upset that she was not given any other pain medications other than Toradol but was demanding her IV be removed when additional pain medication was offered.  Attempted to reassure patient that her work-up for chest pain is reassuring there is no evidence of heart attack or pulmonary embolism. Patient subsequently left without discharge instructions. Final Clinical Impressions(s) / ED Diagnoses   Final diagnoses:  Chest wall pain    ED Discharge Orders    None       [place] Fant, Annie Main, MD 02/04/19 [phone]

## 2019-02-04 NOTE — Discharge Instructions (Addendum)
There is no evidence of heart attack or blood clot in the lung.  Your chest pain appears to be coming from your chest wall.  Take the anti-inflammatories as prescribed and follow-up with your doctor.  Return to the ED if your chest pain becomes exertional, associated with shortness of breath, sweating, vomiting or any other concerns.

## 2019-02-04 NOTE — ED Notes (Signed)
IV attempted x1; unsuccessful. Patient cried and yelled at this nurse and stated that she does not want an IV unless the other test (D-dimer) is positive. Attempted to console pt and provided tissues; patient not receptive to this.

## 2019-02-06 ENCOUNTER — Ambulatory Visit: Payer: Self-pay | Admitting: Family Medicine

## 2019-02-06 ENCOUNTER — Ambulatory Visit (INDEPENDENT_AMBULATORY_CARE_PROVIDER_SITE_OTHER): Payer: Medicaid Other | Admitting: Family Medicine

## 2019-02-06 ENCOUNTER — Other Ambulatory Visit: Payer: Self-pay

## 2019-02-06 VITALS — BP 135/80 | HR 98 | Temp 97.8°F | Wt 260.0 lb

## 2019-02-06 DIAGNOSIS — J45909 Unspecified asthma, uncomplicated: Secondary | ICD-10-CM | POA: Diagnosis not present

## 2019-02-06 DIAGNOSIS — R0789 Other chest pain: Secondary | ICD-10-CM | POA: Insufficient documentation

## 2019-02-06 HISTORY — DX: Other chest pain: R07.89

## 2019-02-06 MED ORDER — CAPSAICIN-MENTHOL-METHYL SAL 0.025-1-12 % EX CREA: 1.0000 "application " | TOPICAL_CREAM | Freq: Four times a day (QID) | CUTANEOUS | 0 refills | Status: DC

## 2019-02-06 MED ORDER — ALBUTEROL SULFATE HFA 108 (90 BASE) MCG/ACT IN AERS: 2.0000 | INHALATION_SPRAY | RESPIRATORY_TRACT | 0 refills | Status: DC | PRN

## 2019-02-06 MED ORDER — MELOXICAM 15 MG PO TABS: 15.0000 mg | ORAL_TABLET | Freq: Every day | ORAL | 0 refills | Status: DC

## 2019-02-06 NOTE — Assessment & Plan Note (Signed)
Acute.  Unlikely ACS or PE given recent work-up in ED 2 days ago.  Does seem to be more consistent with musculoskeletal source given tenderness with movement and recent use of crutches.  She has been out of her albuterol for which she says is asthma, however there is no diagnosis mentioned or history of PFTs.  No signs of pneumonia on my exam or signs of asthma exacerbation. - Given prescription for capsaicin and meloxicam - Reviewed return precautions, RTC as needed

## 2019-02-06 NOTE — Patient Instructions (Signed)
Thank you for coming in to see [place] today. Please see below to review our plan for today's visit.  1.  Apply the capsaicin cream to your chest 4 times daily.  Make sure you wash your hands after each application.  You can take the meloxicam once per day.  Follow-up with your orthopedic surgeon. 2.  If your shortness of breath becomes worse or your chest pain changes, call [phone].  This does appear to be more of a musculoskeletal issue and should improve in the next few weeks. 3.  I called in a refill for your albuterol.  Please call the clinic at [phone] if your symptoms worsen or you have any concerns. It was our pleasure to serve you.  Harriet Butte, [place], PGY-3

## 2019-02-06 NOTE — Progress Notes (Signed)
   Subjective   Patient ID: Tracy Coleman    DOB: [birth year], 43 y.o. female   MRN: [id number]  CC: "chest pain"  HPI: Tracy Coleman is a 43 y.o. female who presents to clinic today for the following:  CHEST PAIN  Onset: 7 days Pain characteristic: sharp and intermittent Severity: 9/10 Exacerbating factors: Movement of right arm, deep inhalation Alleviating factors: None Radiation: None  PMH History of blood clot or heart problems or aneurysms: None  Symptoms Nausea/vomiting: No Shortness of breath: Some, primarily with deep inspiration Pleuritic pain: Yes Cough: Chronic and nonproductive which she attributes to her smoking Swelling of legs: no Syncope: no Heart burn or dysphagia: no Immobility: Recent ankle surgery 2 weeks ago  Note, patient was seen 2 days ago at the ED for right-sided chest pain.  CTA did not reveal PE and there were no signs of cardiac involvement based on troponins and EKG.  This was thought to be related to musculoskeletal pain.  She was given a Toradol shot but feels like it did not help.  She has been using crutches which could be contributing.  ROS: see HPI for pertinent.  [place]: Reviewed. Smoking status reviewed. Medications reviewed.  Objective   BP 135/80   Pulse 98   Temp 97.8 F (36.6 C) (Oral)   Wt 260 lb (117.9 kg)   LMP 01/15/2019 Comment: urine pregnancy test negative 01/23/19  SpO2 98%   BMI 43.27 kg/m  Vitals and nursing note reviewed.  General: well nourished, well developed, NAD with non-toxic appearance HEENT: normocephalic, atraumatic, moist mucous membranes Cardiovascular: regular rate and rhythm without murmurs, rubs, or gallops, there is exquisite tenderness to gentle palpation of the right chest wall and with movement of the right arm Lungs: clear to auscultation bilaterally with normal work of breathing Skin: warm, dry, no rashes or lesions, cap refill < 2 seconds Extremities: warm and well perfused, normal tone, no  edema, 5/5 motor strength on upper extremities with intact passive and active ROM, neurovascular intact  Assessment & Plan   Atypical chest pain Acute.  Unlikely ACS or PE given recent work-up in ED 2 days ago.  Does seem to be more consistent with musculoskeletal source given tenderness with movement and recent use of crutches.  She has been out of her albuterol for which she says is asthma, however there is no diagnosis mentioned or history of PFTs.  No signs of pneumonia on my exam or signs of asthma exacerbation. - Given prescription for capsaicin and meloxicam - Reviewed return precautions, RTC as needed  No orders of the defined types were placed in this encounter.  Meds ordered this encounter  Medications  . Capsaicin-Menthol-Methyl Sal (CAPSAICIN-METHYL SAL-MENTHOL) 0.025-1-12 % CREA    Sig: Apply 1 application topically 4 (four) times daily.    Dispense:  1 Tube    Refill:  0  . meloxicam (MOBIC) 15 MG tablet    Sig: Take 1 tablet (15 mg total) by mouth daily.    Dispense:  30 tablet    Refill:  0  . albuterol (PROVENTIL HFA;VENTOLIN HFA) 108 (90 Base) MCG/ACT inhaler    Sig: Inhale 2 puffs into the lungs every 4 (four) hours as needed for wheezing or shortness of breath.    Dispense:  1 Inhaler    Refill:  0    Harriet Butte, [place], PGY-3 02/06/2019, 5:24 PM

## 2019-02-07 ENCOUNTER — Ambulatory Visit (HOSPITAL_COMMUNITY): Payer: Medicaid Other

## 2019-02-07 DIAGNOSIS — M19079 Primary osteoarthritis, unspecified ankle and foot: Secondary | ICD-10-CM | POA: Insufficient documentation

## 2019-02-07 DIAGNOSIS — S92253A Displaced fracture of navicular [scaphoid] of unspecified foot, initial encounter for closed fracture: Secondary | ICD-10-CM

## 2019-02-07 HISTORY — DX: Displaced fracture of navicular (scaphoid) of unspecified foot, initial encounter for closed fracture: S92.253A

## 2019-02-13 ENCOUNTER — Ambulatory Visit (HOSPITAL_COMMUNITY)
Admission: RE | Admit: 2019-02-13 | Discharge: 2019-02-13 | Disposition: A | Discharge status: Home / Self Care | Payer: Medicaid Other | Source: Ambulatory Visit | Attending: Obstetrics and Gynecology | Admitting: Obstetrics and Gynecology

## 2019-02-13 DIAGNOSIS — N882 Stricture and stenosis of cervix uteri: Secondary | ICD-10-CM

## 2019-02-25 ENCOUNTER — Telehealth: Payer: Self-pay | Admitting: Family Medicine

## 2019-02-25 ENCOUNTER — Other Ambulatory Visit: Payer: Self-pay | Admitting: Family Medicine

## 2019-02-25 DIAGNOSIS — Z6841 Body Mass Index (BMI) 40.0 and over, adult: Principal | ICD-10-CM

## 2019-02-25 NOTE — Telephone Encounter (Signed)
Patient offered phone visit instead of in person and she accepted as all she wants is referal to bariatric.  BMI is >40 and there are prior notes about wanting procedure referal.  She says she has met with [facility] and would like them to be the destination.  She has been informed that procedures might be delayed by COVID19 crisis and she understands.  3/18 visit will be cancelled.  -Dr. Criss Rosales

## 2019-02-25 NOTE — Telephone Encounter (Signed)
Order placed

## 2019-02-26 ENCOUNTER — Telehealth: Payer: Self-pay | Admitting: Obstetrics and Gynecology

## 2019-02-26 ENCOUNTER — Ambulatory Visit: Payer: Medicaid Other | Admitting: Family Medicine

## 2019-02-26 NOTE — Telephone Encounter (Signed)
GYN telephone note  Patient called [phone] and d/w her re: u/s and pap results. I told her I recommend going to the OR and doing a colposcopy, cervical dilation, hysteroscopy, IUD removal. Patient states she doesn't want another IUD so I recommend placing a temporary balloon in the cervix to keep the newly opened tract open. She is amenorrheic on it but unknown what her periods will do with the Mirena out but she wasn't using it for period control. I brought up doing a BTL and she would like to do this, so can do a laparoscopic filshie btl at the time of the above surgery. Pt amenable to plan. GYN elective surgeries are on hold for the time being so will touch base with her when we get more information on scheduling. I told her to technically considered the IUD expired but there are clinical trials in the [place] looking at it for 7 year use and the fact that she isn't having hematometria and has a thin lining means that it is working to some extent but I told her to still use back up for birth control. Pt amenable to plan.   Durene Romans MD Attending [facility] [employer] (Faculty Practice) 02/26/2019 Time: 1225pm

## 2019-05-13 ENCOUNTER — Other Ambulatory Visit: Payer: Self-pay

## 2019-05-13 ENCOUNTER — Ambulatory Visit: Payer: Medicaid Other | Admitting: Family Medicine

## 2019-05-13 VITALS — BP 120/82 | HR 101 | Ht 65.0 in | Wt 268.0 lb

## 2019-05-13 DIAGNOSIS — T7840XA Allergy, unspecified, initial encounter: Secondary | ICD-10-CM | POA: Diagnosis not present

## 2019-05-13 MED ORDER — CETIRIZINE HCL 10 MG PO TABS: 10.0000 mg | ORAL_TABLET | Freq: Every day | ORAL | 11 refills | Status: DC

## 2019-05-13 MED ORDER — FLUTICASONE PROPIONATE 50 MCG/ACT NA SUSP: 2.0000 | Freq: Every day | NASAL | 6 refills | Status: DC

## 2019-05-13 NOTE — Progress Notes (Signed)
   Subjective:    Patient ID: Tracy Coleman, female    DOB: 01/02/1976, 43 y.o.   MRN: [id number]   CC: Congestion and sinus pressure  HPI: Patient is a 43 yo female with a past medical history significant for asthma and seasonal allergies who presents today complaining of sinus pressure and ear pain for the past few days. Patient has been taking OTC allergy medications for the past few days with minimal relief. Patient also report pressure and bilateral ear pain every time she blows her nose. Congestion has caused occasional headaches She endorses watery eyes and itchy nose. She currently denies any wheezing, SOB, dizziness.  Smoking status reviewed   ROS: all other systems were reviewed and are negative other than in the HPI   Past Medical History:  Diagnosis Date  . Anxiety   . Arthritis   . Asthma   . Depression   . Hypertension   . Obesity   . PTSD (post-traumatic stress disorder)     Past Surgical History:  Procedure Laterality Date  . ARTHRODESIS METATARSALPHALANGEAL JOINT (MTPJ) Right 04/11/2018   Procedure: ARTHRODESIS METATARSALPHALANGEAL JOINT (MTPJ);  Surgeon: Wylene Simmer, MD;  Location: [facility];  Service: Orthopedics;  Laterality: Right;  . FOOT ARTHRODESIS Right 01/23/2019   Procedure: Open treatment of right navicular nonunion;  Surgeon: Wylene Simmer, MD;  Location: [facility];  Service: Orthopedics;  Laterality: Right;  . LEEP     x2. Last one 2013    Past medical history, surgical, family, and social history reviewed and updated in the EMR as appropriate.  Objective:  BP 120/82   Pulse (!) 101   Ht 5\' 5"  (1.651 m)   Wt 268 lb (121.6 kg)   SpO2 98%   BMI 44.60 kg/m   Vitals and nursing note reviewed  General: NAD, pleasant, able to participate in exam HEENT: Maxillary sinus tenderness with palpation, mild nasal crusting with boggy turbinates, bilateral ear exam unremarkable, no fluid level or erythematous TM.  Cardiac:  RRR, normal heart sounds, no murmurs. 2+ radial and PT pulses bilaterally Respiratory: CTAB, normal effort, No wheezes, rales or rhonchi Abdomen: soft, nontender, nondistended, no hepatic or splenomegaly, +BS Extremities: no edema or cyanosis. WWP. Skin: warm and dry, no rashes noted Neuro: alert and oriented x4, no focal deficits Psych: Normal affect and mood   Assessment & Plan:   Allergies Patient has sinus congestion associated with ear pain and mild rhinitis. Patient is currently not on any allergy medication. Symptoms are most consistent with allergic rhinitis given itchy watery eyes. No pulmonary symptoms reported such as wheezing though patient does have asthma.  --Will prescribe flonase to be used in the morning --Zyrtec in the evening --Follow up with PCP as needed    Marjie Skiff, MD [place] PGY-3

## 2019-05-13 NOTE — Assessment & Plan Note (Signed)
Patient has sinus congestion associated with ear pain and mild rhinitis. Patient is currently not on any allergy medication. Symptoms are most consistent with allergic rhinitis given itchy watery eyes. No pulmonary symptoms reported such as wheezing though patient does have asthma.  --Will prescribe flonase to be used in the morning --Zyrtec in the evening --Follow up with PCP as needed

## 2019-06-09 ENCOUNTER — Encounter: Payer: Self-pay | Admitting: Physical Therapy

## 2019-06-09 ENCOUNTER — Ambulatory Visit: Payer: Medicaid Other | Attending: Orthopedic Surgery | Admitting: Physical Therapy

## 2019-06-09 ENCOUNTER — Other Ambulatory Visit: Payer: Self-pay

## 2019-06-09 DIAGNOSIS — M25571 Pain in right ankle and joints of right foot: Secondary | ICD-10-CM | POA: Diagnosis not present

## 2019-06-09 DIAGNOSIS — R262 Difficulty in walking, not elsewhere classified: Secondary | ICD-10-CM | POA: Insufficient documentation

## 2019-06-09 DIAGNOSIS — R600 Localized edema: Secondary | ICD-10-CM | POA: Diagnosis present

## 2019-06-09 NOTE — Therapy (Signed)
[place], [place], [address] Phone: [phone]   Fax:  [phone]  Physical Therapy Evaluation  Patient Details  Name: Tracy Coleman MRN: [id number] Date of Birth: 24-Jul-1976 Referring Provider (PT): Wylene Simmer, MD   Encounter Date: 06/09/2019  PT End of Session - 06/09/19 1546    Visit Number  1    Authorization Type  MCD submitted 6/29    PT Start Time  2951    PT Stop Time  1549    PT Time Calculation (min)  34 min    Activity Tolerance  Patient tolerated treatment well    Behavior During Therapy  [facility] for tasks assessed/performed       Past Medical History:  Diagnosis Date  . Anxiety   . Arthritis   . Asthma   . Depression   . Hypertension   . Obesity   . PTSD (post-traumatic stress disorder)     Past Surgical History:  Procedure Laterality Date  . ARTHRODESIS METATARSALPHALANGEAL JOINT (MTPJ) Right 04/11/2018   Procedure: ARTHRODESIS METATARSALPHALANGEAL JOINT (MTPJ);  Surgeon: Wylene Simmer, MD;  Location: [place];  Service: Orthopedics;  Laterality: Right;  . FOOT ARTHRODESIS Right 01/23/2019   Procedure: Open treatment of right navicular nonunion;  Surgeon: Wylene Simmer, MD;  Location: [place];  Service: Orthopedics;  Laterality: Right;  . LEEP     x2. Last one 2013    There were no vitals filed for this visit.   Subjective Assessment - 06/09/19 1509    Subjective  01/23/19 surgery on Rt foot. Xrays show I am healing okay. I lose my balance and have a hard time placing equal weight on my feet. Pain anteriorly-medial and under heel. Still using shower chair and motor chair at grocery store. I have been trying to wear padded tennis shoes. Screw in great toe on Rt 04/11/18- 1st MTP fusion per xray on pt phone.    How long can you walk comfortably?  5-10 min    Patient Stated Goals  30 min of walking, learn exercises, walk without limp    Currently in  Pain?  Yes    Pain Score  5     Pain Location  Ankle    Pain Orientation  Right;Anterior;Mid    Pain Descriptors / Indicators  Aching;Sore    Aggravating Factors   weight bearing    Pain Relieving Factors  AROM before getting up.         [facility] PT Assessment - 06/09/19 0001      Assessment   Medical Diagnosis  Pain in Rt foot    Referring Provider (PT)  Wylene Simmer, MD    Onset Date/Surgical Date  01/23/19    Hand Dominance  Right    Next MD Visit  --   August, 2020   Prior Therapy  no      Precautions   Precautions  None      Restrictions   Weight Bearing Restrictions  No      Balance Screen   Has the patient fallen in the past 6 months  Yes    How many times?  1    Has the patient had a decrease in activity level because of a fear of falling?   Yes    Is the patient reluctant to leave their home because of a fear of falling?   No      Home Environment   Living Environment  Private residence    Living Arrangements  Spouse/significant other;Children    Additional Comments  stairs at home      Prior Function   Level of [place] Requirements  cares for husband      Cognition   Overall Cognitive Status  Within Functional Limits for tasks assessed      Observation/Other Assessments   Focus on Therapeutic Outcomes (FOTO)   --   n/a MCD     Sensation   Additional Comments  Occasional N/T in foot      Posture/Postural Control   Posture Comments  collapsing arch      ROM / Strength   AROM / PROM / Strength  AROM      AROM   Overall AROM Comments  0 great toe extension    AROM Assessment Site  Ankle    Right/Left Ankle  Right    Right Ankle Dorsiflexion  0    Right Ankle Plantar Flexion  30    Right Ankle Inversion  20    Right Ankle Eversion  10      Palpation   Palpation comment  TTP medial deltoid ligaments and distal posterior tibialis      Ambulation/Gait   Gait Comments  antalgic, lacking hip extension in weight bearing  phase of gait, lacking toe off                Objective measurements completed on examination: See above findings.      [facility] Adult PT Treatment/Exercise - 06/09/19 0001      Exercises   Exercises  Ankle      Ankle Exercises: Stretches   Gastroc Stretch  3 reps;30 seconds    Gastroc Stretch Limitations  long sitting with towel      Ankle Exercises: Seated   Other Seated Ankle Exercises  inversion yellow tband             PT Education - 06/09/19 2114    Education Details  anatomy of condition, POC, HEP, exercise form/rationale    Person(s) Educated  Patient    Methods  Explanation;Demonstration;Tactile cues;Verbal cues;Handout    Comprehension  Verbalized understanding;Returned demonstration;Verbal cues required;Need further instruction;Tactile cues required       PT Short Term Goals - 06/09/19 2123      PT SHORT TERM GOAL #1   Title  Pt will demo at least 5 deg passive DF    Baseline  0 at eval    Time  3    Period  Weeks    Status  New    Target Date  06/30/19      PT SHORT TERM GOAL #2   Title  pt will be able to perform some form of cardio exercise that does not increase ankle pain to challenge full body health    Baseline  was walking for exercise but is now limited by pain, will assist in finding appropriate exercise to avoid ankle damage    Time  3    Period  Weeks    Status  New    Target Date  06/30/19      PT SHORT TERM GOAL #3   Title  Pt will demonstrate proper form with exercises established for short term HEP    Baseline  will progress as appropriate through initial authorization    Time  3    Period  Weeks    Status  New    Target Date  06/30/19        PT Long Term Goals - 06/09/19 2127      PT LONG TERM GOAL #1   Title  TBD at reauthorization             Plan - 06/09/19 2115    Clinical Impression Statement  Pt presents to PT s/p open treatment of navicular non-union in Feb, 2020. Pt is ambulating without AD in  tennis shoes. Pt is concerned about her limp and would like to ambulate without this as well as reach functional goals. Pt has had a procedure on her 1st MTP which does not allow for great to extension necessary for normalized toe off in gait and is limited to 0 deg of DF passively at eval. Medial ankle irriation has presented to to compensatory patterns. Discussed proper shoe wear and that she will likely have gait deviations going forward but she should still be able to reach her goals. Pt reports being told that the "screw in the ankle is broken" and may need to come out at a later date. Pt wants to lose weight and we discussed using a bike rather than walking to avoid increased pain from compensation patterns in gait. Pt will benefit from skilled PT to address deficits and meet functional goals.    Personal Factors and Comorbidities  Fitness;Time since onset of injury/illness/exacerbation;Comorbidity 1    Comorbidities  anxiety, depression, obesity, PTSD    Examination-Activity Limitations  Sit;Sleep;Squat;Stairs;Stand;Transfers;Locomotion Level    Examination-Participation Restrictions  Cleaning;Community Activity;Meal Prep    Stability/Clinical Decision Making  Evolving/Moderate complexity    Clinical Decision Making  Moderate    Rehab Potential  Good    PT Frequency  --   3 visits in first authorization   PT Treatment/Interventions  ADLs/Self Care Home Management;Cryotherapy;Electrical Stimulation;Gait training;Moist Heat;Stair training;Functional mobility training;Therapeutic activities;Therapeutic exercise;Balance training;Neuromuscular re-education;Manual techniques;Patient/family education;Scar mobilization;Passive range of motion;Dry needling;Vasopneumatic Device;Taping    PT Next Visit Plan  talocrural mobility check, proximal strengthening, intrinsic activation    PT Home Exercise Plan  DF stretch, inversion yellow    Consulted and Agree with Plan of Care  Patient       Patient will  benefit from skilled therapeutic intervention in order to improve the following deficits and impairments:  Abnormal gait, Decreased range of motion, Difficulty walking, Decreased activity tolerance, Pain, Improper body mechanics, Impaired flexibility, Decreased balance, Decreased strength, Increased edema, Postural dysfunction  Visit Diagnosis: 1. Pain in right ankle and joints of right foot   2. Difficulty in walking, not elsewhere classified   3. Localized edema        Problem List Patient Active Problem List   Diagnosis Date Noted  . Allergies 05/13/2019  . Atypical chest pain 02/06/2019  . Asthma 02/06/2019  . Cervical stenosis (uterine cervix) 01/29/2019  . IUD strings lost 01/29/2019  . Cervical dysplasia 01/29/2019  . MDD (major depressive disorder), severe ([place]) 01/03/2019  . Contraception management 12/23/2018  . Pain in joint of right ankle 10/18/2018  . Acquired hallux rigidus 10/18/2018  . 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 Essential hypertension 05/03/2013  . Obesity 05/03/2013  . Anxiety 05/03/2013  . Depression 05/03/2013    Meagan Ancona C. Alessandro Griep PT, DPT 06/09/19 9:31 PM   [facility] [address] [place], [place], [address] Phone: [phone]   Fax:  [phone]  Name: Tracy Coleman MRN: [id number] Date of Birth: 1976-07-24

## 2019-06-13 ENCOUNTER — Other Ambulatory Visit: Payer: Self-pay

## 2019-06-13 ENCOUNTER — Emergency Department (HOSPITAL_COMMUNITY)
Admission: EM | Admit: 2019-06-13 | Discharge: 2019-06-13 | Disposition: A | Discharge status: Home / Self Care | Payer: Medicaid Other | Attending: Emergency Medicine | Admitting: Emergency Medicine

## 2019-06-13 ENCOUNTER — Encounter (HOSPITAL_COMMUNITY): Payer: Self-pay | Admitting: Emergency Medicine

## 2019-06-13 DIAGNOSIS — Z8709 Personal history of other diseases of the respiratory system: Secondary | ICD-10-CM | POA: Diagnosis not present

## 2019-06-13 DIAGNOSIS — Z87891 Personal history of nicotine dependence: Secondary | ICD-10-CM | POA: Diagnosis not present

## 2019-06-13 DIAGNOSIS — Z79899 Other long term (current) drug therapy: Secondary | ICD-10-CM | POA: Insufficient documentation

## 2019-06-13 DIAGNOSIS — I10 Essential (primary) hypertension: Secondary | ICD-10-CM | POA: Insufficient documentation

## 2019-06-13 DIAGNOSIS — R102 Pelvic and perineal pain: Secondary | ICD-10-CM

## 2019-06-13 DIAGNOSIS — N939 Abnormal uterine and vaginal bleeding, unspecified: Secondary | ICD-10-CM

## 2019-06-13 LAB — URINALYSIS, ROUTINE W REFLEX MICROSCOPIC
Bilirubin Urine: NEGATIVE
Glucose, UA: NEGATIVE mg/dL
Ketones, ur: NEGATIVE mg/dL
Nitrite: NEGATIVE
Protein, ur: NEGATIVE mg/dL
Specific Gravity, Urine: 1.016 (ref 1.005–1.030)
pH: 5 (ref 5.0–8.0)

## 2019-06-13 LAB — POC URINE PREG, ED: Preg Test, Ur: NEGATIVE

## 2019-06-13 MED ORDER — HYDROCODONE-ACETAMINOPHEN 5-325 MG PO TABS
1.0000 | ORAL_TABLET | Freq: Once | ORAL | Status: AC
  Administered 2019-06-13: 1 via ORAL
  Filled 2019-06-13: qty 1

## 2019-06-13 MED ORDER — ONDANSETRON 4 MG PO TBDP
4.0000 mg | ORAL_TABLET | Freq: Once | ORAL | Status: AC
  Administered 2019-06-13: 04:00:00 4 mg via ORAL
  Filled 2019-06-13: qty 1

## 2019-06-13 NOTE — ED Triage Notes (Signed)
Pt reports abdominal cramping and bleeding starting yesterday.

## 2019-06-13 NOTE — ED Provider Notes (Signed)
[place] EMERGENCY DEPARTMENT Provider Note   CSN: [id number] Arrival date & time: 06/13/19  0256     History   Chief Complaint Chief Complaint  Patient presents with  . Abdominal Cramping  . Vaginal Bleeding    HPI Tracy Coleman is a 43 y.o. female.     The history is provided by the patient.  Abdominal Cramping This is a new problem. The current episode started 12 to 24 hours ago. The problem occurs constantly. The problem has been gradually worsening. Associated symptoms include abdominal pain. Pertinent negatives include no chest pain. Exacerbated by: palpation. Nothing relieves the symptoms.  Vaginal Bleeding Associated symptoms: abdominal pain and back pain   Associated symptoms: no dysuria    Patient reports lower abdominal cramping and burning over the past 24 hours.  She also reports vaginal spotting along with the cramping.  She reports that she has not had a period in 2 years because she has an IUD. No fevers or vomiting.  She reports her family asked her to be evaluated Past Medical History:  Diagnosis Date  . Anxiety   . Arthritis   . Asthma   . Depression   . Hypertension   . Obesity   . PTSD (post-traumatic stress disorder)     Patient Active Problem List   Diagnosis Date Noted  . Allergies 05/13/2019  . Atypical chest pain 02/06/2019  . Asthma 02/06/2019  . Cervical stenosis (uterine cervix) 01/29/2019  . IUD strings lost 01/29/2019  . Cervical dysplasia 01/29/2019  . MDD (major depressive disorder), severe ([place]) 01/03/2019  . Contraception management 12/23/2018  . Pain in joint of right ankle 10/18/2018  . Acquired hallux rigidus 10/18/2018  . 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 Essential hypertension 05/03/2013  . Obesity 05/03/2013  . Anxiety 05/03/2013  . Depression 05/03/2013    Past Surgical History:  Procedure Laterality Date  . ARTHRODESIS METATARSALPHALANGEAL JOINT (MTPJ) Right 04/11/2018   Procedure: ARTHRODESIS  METATARSALPHALANGEAL JOINT (MTPJ);  Surgeon: Wylene Simmer, MD;  Location: [place];  Service: Orthopedics;  Laterality: Right;  . FOOT ARTHRODESIS Right 01/23/2019   Procedure: Open treatment of right navicular nonunion;  Surgeon: Wylene Simmer, MD;  Location: [place];  Service: Orthopedics;  Laterality: Right;  . LEEP     x2. Last one 2013     OB History    Gravida  2   Para  1   Term  1   Preterm      AB  1   Living  1     SAB  1   TAB      Ectopic      Multiple      Live Births  1            Home Medications    Prior to Admission medications   Medication Sig Start Date End Date Taking? Authorizing Provider  albuterol (PROVENTIL HFA;VENTOLIN HFA) 108 (90 Base) MCG/ACT inhaler Inhale 2 puffs into the lungs every 4 (four) hours as needed for wheezing or shortness of breath. 02/06/19   Alden Bing, DO  amLODipine (NORVASC) 5 MG tablet Take 1 tablet (5 mg total) by mouth daily. 09/20/18   Rory Percy, DO  Capsaicin-Menthol-Methyl Sal (CAPSAICIN-METHYL SAL-MENTHOL) 0.025-1-12 % CREA Apply 1 application topically 4 (four) times daily. 02/06/19   Crocker Bing, DO  cetirizine (ZYRTEC) 10 MG tablet Take 1 tablet (10 mg total) by mouth daily. 05/13/19   Diallo, Earna Coder,  MD  chlorthalidone (HYGROTON) 25 MG tablet Take 1 tablet (25 mg total) by mouth daily. 09/20/18   Rory Percy, DO  docusate sodium (COLACE) 100 MG capsule Take 1 capsule (100 mg total) by mouth 2 (two) times daily. While taking narcotic pain medicine. Patient not taking: Reported on 02/04/2019 01/23/19   Corky Sing, PA-C  enoxaparin (LOVENOX) 40 MG/0.4ML injection Inject 0.4 mLs (40 mg total) into the skin daily. Patient not taking: Reported on 01/29/2019 01/23/19   Corky Sing, PA-C  famotidine (PEPCID) 20 MG tablet Take 20 mg by mouth daily.    [provider]  fluticasone (FLONASE) 50 MCG/ACT nasal spray Place 2 sprays into both  nostrils daily. 05/13/19   Diallo, Earna Coder, MD  ketorolac (TORADOL) 10 MG tablet ketorolac 10 mg tablet  Take 1 tablet every 6 hours by oral route for 5 days.    [provider]  levonorgestrel (MIRENA) 20 MCG/24HR IUD 1 each by Intrauterine route once.     [provider]  lisinopril (PRINIVIL,ZESTRIL) 40 MG tablet Take 1 tablet (40 mg total) by mouth daily. 09/20/18   Rory Percy, DO  meloxicam (MOBIC) 15 MG tablet Take 1 tablet (15 mg total) by mouth daily. 02/06/19   Mango Bing, DO  mirtazapine (REMERON) 15 MG tablet Take 1 tablet (15 mg total) by mouth at bedtime. For mood 01/06/19   Connye Burkitt, NP  naproxen (NAPROSYN) 500 MG tablet Take 1 tablet (500 mg total) by mouth 2 (two) times daily as needed. 02/04/19   Rancour, Annie Main, MD  senna (SENOKOT) 8.6 MG TABS tablet Take 2 tablets (17.2 mg total) by mouth 2 (two) times daily. Patient not taking: Reported on 01/29/2019 01/23/19   Corky Sing, PA-C  venlafaxine XR (EFFEXOR-XR) 37.5 MG 24 hr capsule Take 1 capsule (37.5 mg total) by mouth daily with breakfast. For mood Patient taking differently: Take 37.5 mg by mouth at bedtime. For mood 01/07/19   Connye Burkitt, NP    Family History Family History  Problem Relation Age of Onset  . Alcohol abuse Mother   . Drug abuse Mother   . Depression Mother   . Early death Mother        pneumonia, age 30  . Hypertension Mother   . Alcohol abuse Father   . Drug abuse Father   . Diabetes Maternal Grandmother   . Hypertension Maternal Grandmother   . Diabetes Paternal Grandmother   . Hypertension Paternal Grandmother     Social History Social History   Tobacco Use  . Smoking status: Former Smoker    Packs/day: 0.50    Types: Cigarettes  . Smokeless tobacco: Never Used  . Tobacco comment: Stopped smoking for surgery 01/23/19  Substance Use Topics  . Alcohol use: Yes    Frequency: Never    Comment: occasional 2-3 drinks/week  . 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  Gastrointestinal: Positive for abdominal pain.  Genitourinary: Positive for vaginal bleeding. Negative for dysuria and flank pain.  Musculoskeletal: Positive for back pain.       Chronic back pain  All other systems reviewed and are negative.    Physical Exam Updated Vital Signs BP 116/76 (BP Location: Right Arm)   Pulse (!) 102   Temp 98.2  F (36.8 C) (Oral)   Resp 16   Ht 1.626 m (5\' 4" )   Wt 122.5 kg   SpO2 98%   BMI 46.35 kg/m   Physical Exam CONSTITUTIONAL: Well developed/well nourished HEAD: Normocephalic/atraumatic EYES: EOMI ENMT: Mask in place NECK: supple no meningeal signs SPINE/BACK:entire spine nontender CV: S1/S2 noted, no murmurs/rubs/gallops noted LUNGS: Lungs are clear to auscultation bilaterally, no apparent distress ABDOMEN: soft, nontender, no rebound or guarding, bowel sounds noted throughout abdomen GU:no cva tenderness,small amt of blood noted, no lacerations, no foreign bodies, os appears closed, female nurse Anderson Malta) present for exam NEURO: Pt is awake/alert/appropriate, moves all extremitiesx4.  No facial droop.   EXTREMITIES: pulses normal/equal, full ROM SKIN: warm, color normal PSYCH: no abnormalities of mood noted, alert and oriented to situation   ED Treatments / Results  Labs (all labs ordered are listed, but only abnormal results are displayed) Labs Reviewed  URINALYSIS, ROUTINE W REFLEX MICROSCOPIC - Abnormal; Notable for the following components:      Result Value   APPearance CLOUDY (*)    Hgb urine dipstick LARGE (*)    Leukocytes,Ua TRACE (*)    Bacteria, UA RARE (*)    All other components within normal limits  POC URINE PREG, ED    EKG None  Radiology No results found.  Procedures Procedures   Medications Ordered in ED  Medications  HYDROcodone-acetaminophen (NORCO/VICODIN) 5-325 MG per tablet 1 tablet (1 tablet Oral Given 06/13/19 0425)  ondansetron (ZOFRAN-ODT) disintegrating tablet 4 mg (4 mg Oral Given 06/13/19 0359)     Initial Impression / Assessment and Plan / ED Course  I have reviewed the triage vital signs and the nursing notes.  Pertinent labs results that were available during my care of the patient were reviewed by me and considered in my medical decision making (see chart for details).        4:12 AM Patient with longstanding IUD/amenorrhea, now with pain and spotting.  Per OB/GYN notes, she has cervical stenosis, and has been recommended that she go to the OR for cervical dilation and IUD removal Due to the COVID-19 pandemic, they have been unable to schedule this. Currently she is awake alert, stable at this time.  No abdominal tenderness.  Will perform pelvic exam and reassess 5:14 AM Pelvic exam unremarkable.  Patient is overall well-appearing.  She feels improved. She would like to be discharged home.  She declines any pain medicines.  She declines further testing  Plan for her will be to call her OB/GYN Dr. Ilda Basset.  At this point, she may benefit from IUD removal.  However currently there is no signs of acute gynecologic emergency.  We discussed strict ER return precautions Final Clinical Impressions(s) / ED Diagnoses   Final diagnoses:  Vaginal bleeding  Pelvic pain in female    ED Discharge Orders    None       Ripley Fraise, MD 06/13/19 [phone]

## 2019-06-16 ENCOUNTER — Encounter: Payer: Self-pay | Admitting: Physical Therapy

## 2019-06-16 ENCOUNTER — Other Ambulatory Visit: Payer: Self-pay

## 2019-06-16 ENCOUNTER — Ambulatory Visit: Payer: Medicaid Other | Attending: Orthopedic Surgery | Admitting: Physical Therapy

## 2019-06-16 DIAGNOSIS — R600 Localized edema: Secondary | ICD-10-CM | POA: Diagnosis present

## 2019-06-16 DIAGNOSIS — M25571 Pain in right ankle and joints of right foot: Secondary | ICD-10-CM

## 2019-06-16 DIAGNOSIS — R262 Difficulty in walking, not elsewhere classified: Secondary | ICD-10-CM

## 2019-06-16 NOTE — Telephone Encounter (Signed)
Attempted to call patient about the appointment. Left a detailed message for her to call us.

## 2019-06-16 NOTE — Therapy (Signed)
[place], [place], [address] Phone: [phone]   Fax:  [phone]  Physical Therapy Treatment  Patient Details  Name: Tracy Coleman MRN: [id number] Date of Birth: [birth year] Referring Provider (PT): Wylene Simmer, MD   Encounter Date: 06/16/2019  PT End of Session - 06/16/19 1436    Visit Number  2    Authorization Type  MCD    Authorization Time Period  7/5-7/18    Authorization - Visit Number  13    PT Start Time  6283    PT Stop Time  1514    PT Time Calculation (min)  46 min    Activity Tolerance  Patient tolerated treatment well    Behavior During Therapy  [facility] for tasks assessed/performed       Past Medical History:  Diagnosis Date  . Anxiety   . Arthritis   . Asthma   . Depression   . Hypertension   . Obesity   . PTSD (post-traumatic stress disorder)     Past Surgical History:  Procedure Laterality Date  . ARTHRODESIS METATARSALPHALANGEAL JOINT (MTPJ) Right 04/11/2018   Procedure: ARTHRODESIS METATARSALPHALANGEAL JOINT (MTPJ);  Surgeon: Wylene Simmer, MD;  Location: [place] [place];  Service: Orthopedics;  Laterality: Right;  . FOOT ARTHRODESIS Right 01/23/2019   Procedure: Open treatment of right navicular nonunion;  Surgeon: Wylene Simmer, MD;  Location: [place];  Service: Orthopedics;  Laterality: Right;  . LEEP     x2. Last one 2013    There were no vitals filed for this visit.  Subjective Assessment - 06/16/19 1432    Subjective  It has been hurting the last few days but I have been walking around a lot.    Patient Stated Goals  30 min of walking, learn exercises, walk without limp    Currently in Pain?  Yes    Pain Score  7     Pain Location  Ankle    Pain Orientation  Right;Anterior    Pain Descriptors / Indicators  Sharp    Aggravating Factors   standing too much    Pain Relieving Factors  keeping it moving                        [facility] Adult PT Treatment/Exercise - 06/16/19 0001      Exercises   Exercises  Knee/Hip      Knee/Hip Exercises: Seated   Long Arc Quad  Both;15 reps    Long Arc Quad Limitations  ball bw knees      Knee/Hip Exercises: Supine   Bridges with [employer]  20 reps      Knee/Hip Exercises: Sidelying   Hip ABduction  20 reps;10 reps    Other Sidelying Knee/Hip Exercises  hip abd to extension, knee 90 deg      Manual Therapy   Manual Therapy  Edema management;Soft tissue mobilization;Taping    Edema Management  anterior aspect of ankle    Soft tissue mobilization  plantar musculature    Kinesiotex  Edema      Kinesiotix   Edema  Rt ankle      Ankle Exercises: Aerobic   Stationary Bike  5 min L2      Ankle Exercises: Seated   ABC's  1 rep    Towel Crunch  Other (comment)   seated, bilateral feet   Other Seated Ankle Exercises  inversion/eversion AROM PT holding leg still;  toe yoga               PT Short Term Goals - 06/09/19 2123      PT SHORT TERM GOAL #1   Title  Pt will demo at least 5 deg passive DF    Baseline  0 at eval    Time  3    Period  Weeks    Status  New    Target Date  06/30/19      PT SHORT TERM GOAL #2   Title  pt will be able to perform some form of cardio exercise that does not increase ankle pain to challenge full body health    Baseline  was walking for exercise but is now limited by pain, will assist in finding appropriate exercise to avoid ankle damage    Time  3    Period  Weeks    Status  New    Target Date  06/30/19      PT SHORT TERM GOAL #3   Title  Pt will demonstrate proper form with exercises established for short term HEP    Baseline  will progress as appropriate through initial authorization    Time  3    Period  Weeks    Status  New    Target Date  06/30/19        PT Long Term Goals - 06/09/19 2127      PT LONG TERM GOAL #1   Title  TBD at reauthorization             Plan - 06/16/19 1518    Clinical Impression Statement  Added proximal strengthening to HEP. Limited coordination for toe motion. Decreased pain following edema mobilization at anterior ankle.    PT Treatment/Interventions  ADLs/Self Care Home Management;Cryotherapy;Electrical Stimulation;Gait training;Moist Heat;Stair training;Functional mobility training;Therapeutic activities;Therapeutic exercise;Balance training;Neuromuscular re-education;Manual techniques;Patient/family education;Scar mobilization;Passive range of motion;Dry needling;Vasopneumatic Device;Taping    PT Next Visit Plan  physioball exercises    PT Home Exercise Plan  DF stretch, inversion yellow; hip abd, hip abd + ext with knee at 90, bridge, LAQ    Consulted and Agree with Plan of Care  Patient       Patient will benefit from skilled therapeutic intervention in order to improve the following deficits and impairments:  Abnormal gait, Decreased range of motion, Difficulty walking, Decreased activity tolerance, Pain, Improper body mechanics, Impaired flexibility, Decreased balance, Decreased strength, Increased edema, Postural dysfunction  Visit Diagnosis: 1. Pain in right ankle and joints of right foot   2. Difficulty in walking, not elsewhere classified   3. Localized edema        Problem List Patient Active Problem List   Diagnosis Date Noted  . Allergies 05/13/2019  . Atypical chest pain 02/06/2019  . Asthma 02/06/2019  . Cervical stenosis (uterine cervix) 01/29/2019  . IUD strings lost 01/29/2019  . Cervical dysplasia 01/29/2019  . MDD (major depressive disorder), severe ([place]) 01/03/2019  . Contraception management 12/23/2018  . Pain in joint of right ankle 10/18/2018  . Acquired hallux rigidus 10/18/2018  . 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 Essential hypertension 05/03/2013  . Obesity 05/03/2013  . Anxiety 05/03/2013  . Depression 05/03/2013    Cutberto Winfree C. Kaya Klausing PT, DPT 06/16/19 3:22  PM   [facility] [address] [place], [place], [address] Phone: [phone]   Fax:  [phone]  Name: Tracy Coleman MRN: [id number] Date of Birth: 1976/02/19

## 2019-06-18 ENCOUNTER — Ambulatory Visit: Payer: Medicaid Other | Admitting: Physical Therapy

## 2019-06-18 ENCOUNTER — Other Ambulatory Visit: Payer: Self-pay

## 2019-06-18 DIAGNOSIS — R262 Difficulty in walking, not elsewhere classified: Secondary | ICD-10-CM

## 2019-06-18 DIAGNOSIS — M25571 Pain in right ankle and joints of right foot: Secondary | ICD-10-CM

## 2019-06-18 DIAGNOSIS — R600 Localized edema: Secondary | ICD-10-CM

## 2019-06-18 NOTE — Therapy (Signed)
[place], [place], [address] Phone: [phone]   Fax:  [phone]  Physical Therapy Treatment  Patient Details  Name: Tracy Coleman MRN: [id number] Date of Birth: 08-09-1976 Referring Provider (PT): Wylene Simmer, MD   Encounter Date: 06/18/2019  PT End of Session - 06/18/19 1549    Visit Number  3    Number of Visits  13    Authorization Type  MCD    Authorization Time Period  7/5-7/18    Authorization - Visit Number  13    Authorization - Number of Visits  2    PT Start Time  1500    PT Stop Time  1540    PT Time Calculation (min)  40 min    Activity Tolerance  Patient tolerated treatment well    Behavior During Therapy  [facility] for tasks assessed/performed       Past Medical History:  Diagnosis Date  . Anxiety   . Arthritis   . Asthma   . Depression   . Hypertension   . Obesity   . PTSD (post-traumatic stress disorder)     Past Surgical History:  Procedure Laterality Date  . ARTHRODESIS METATARSALPHALANGEAL JOINT (MTPJ) Right 04/11/2018   Procedure: ARTHRODESIS METATARSALPHALANGEAL JOINT (MTPJ);  Surgeon: Wylene Simmer, MD;  Location: [place];  Service: Orthopedics;  Laterality: Right;  . FOOT ARTHRODESIS Right 01/23/2019   Procedure: Open treatment of right navicular nonunion;  Surgeon: Wylene Simmer, MD;  Location: [place];  Service: Orthopedics;  Laterality: Right;  . LEEP     x2. Last one 2013    There were no vitals filed for this visit.  Subjective Assessment - 06/18/19 1533    Subjective  Pain is more today, I have been trying to walk more    How long can you walk comfortably?  5-10 min    Patient Stated Goals  30 min of walking, learn exercises, walk without limp    Currently in Pain?  Yes    Pain Score  8     Pain Location  Foot    Pain Orientation  Right    Pain Descriptors / Indicators  Aching    Pain Type  Surgical pain                        [facility] Adult PT Treatment/Exercise - 06/18/19 0001      Knee/Hip Exercises: Stretches   Gastroc Stretch Limitations  --      Knee/Hip Exercises: Seated   Long Arc Quad  Both;20 reps    Long Arc Quad Weight  2 lbs.    Long [employer] Limitations  ball bw knees      Manual Therapy   Manual therapy comments  ankle PROM and gentle jt mobs      Ankle Exercises: Aerobic   Stationary Bike  6 min L2      Ankle Exercises: Seated   BAPS  Sitting;Level 2   2o reps all planes and circles     Ankle Exercises: Stretches   Press photographer Limitations  3X30 sec with strap in sitting later progressed to to slantboard 3X30 sec      Ankle Exercises: Standing   Heel Raises  15 reps    Toe Raise  15 reps    Other Standing Ankle Exercises  weight shifting lateral X 20, anterior to posterior in staggard stance for pre gait weight shifting  and strengthening      Ankle Exercises: Supine   T-Band  yellow 4 way X 20 ea             PT Education - 06/18/19 1548    Education Details  pre gait weight shifting and heel toe rocking to add in at home    Person(s) Educated  Patient    Methods  Explanation;Demonstration;Verbal cues    Comprehension  Verbalized understanding;Returned demonstration       PT Short Term Goals - 06/09/19 2123      PT SHORT TERM GOAL #1   Title  Pt will demo at least 5 deg passive DF    Baseline  0 at eval    Time  3    Period  Weeks    Status  New    Target Date  06/30/19      PT SHORT TERM GOAL #2   Title  pt will be able to perform some form of cardio exercise that does not increase ankle pain to challenge full body health    Baseline  was walking for exercise but is now limited by pain, will assist in finding appropriate exercise to avoid ankle damage    Time  3    Period  Weeks    Status  New    Target Date  06/30/19      PT SHORT TERM GOAL #3   Title  Pt will demonstrate proper form with exercises established for short  term HEP    Baseline  will progress as appropriate through initial authorization    Time  3    Period  Weeks    Status  New    Target Date  06/30/19        PT Long Term Goals - 06/09/19 2127      PT LONG TERM GOAL #1   Title  TBD at reauthorization            Plan - 06/18/19 1551    Clinical Impression Statement  worked on ankle ROM and strength, to progessed to some weight bearing activiites today and pre gait activites today as tolerated. She was able to show improved heel toe pattern but still lacks full weight shift over to her Rt foot due to pain    PT Treatment/Interventions  ADLs/Self Care Home Management;Cryotherapy;Electrical Stimulation;Gait training;Moist Heat;Stair training;Functional mobility training;Therapeutic activities;Therapeutic exercise;Balance training;Neuromuscular re-education;Manual techniques;Patient/family education;Scar mobilization;Passive range of motion;Dry needling;Vasopneumatic Device;Taping    PT Next Visit Plan  physioball exercises    PT Home Exercise Plan  DF stretch, inversion yellow; hip abd, hip abd + ext with knee at 90, bridge, LAQ    Consulted and Agree with Plan of Care  Patient       Patient will benefit from skilled therapeutic intervention in order to improve the following deficits and impairments:  Abnormal gait, Decreased range of motion, Difficulty walking, Decreased activity tolerance, Pain, Improper body mechanics, Impaired flexibility, Decreased balance, Decreased strength, Increased edema, Postural dysfunction  Visit Diagnosis: 1. Pain in right ankle and joints of right foot   2. Difficulty in walking, not elsewhere classified   3. Localized edema        Problem List Patient Active Problem List   Diagnosis Date Noted  . Allergies 05/13/2019  . Atypical chest pain 02/06/2019  . Asthma 02/06/2019  . Cervical stenosis (uterine cervix) 01/29/2019  . IUD strings lost 01/29/2019  . Cervical dysplasia 01/29/2019  . MDD  (major depressive disorder),  severe ([place]) 01/03/2019  . Contraception management 12/23/2018  . Pain in joint of right ankle 10/18/2018  . Acquired hallux rigidus 10/18/2018  . 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 Essential hypertension 05/03/2013  . Obesity 05/03/2013  . Anxiety 05/03/2013  . Depression 05/03/2013    [name] 06/18/2019, 3:53 PM  [facility] [address] [place], [place], [address] Phone: [phone]   Fax:  [phone]  Name: Tracy Coleman MRN: [id number] Date of Birth: 12-Aug-1976

## 2019-06-24 ENCOUNTER — Ambulatory Visit: Payer: Medicaid Other | Admitting: Physical Therapy

## 2019-06-24 ENCOUNTER — Other Ambulatory Visit: Payer: Self-pay

## 2019-06-24 ENCOUNTER — Encounter: Payer: Self-pay | Admitting: Physical Therapy

## 2019-06-24 DIAGNOSIS — R262 Difficulty in walking, not elsewhere classified: Secondary | ICD-10-CM

## 2019-06-24 DIAGNOSIS — R600 Localized edema: Secondary | ICD-10-CM

## 2019-06-24 DIAGNOSIS — M25571 Pain in right ankle and joints of right foot: Secondary | ICD-10-CM

## 2019-06-24 NOTE — Therapy (Signed)
[place], [place], [address] Phone: [phone]   Fax:  [phone]  Physical Therapy Treatment/ERO Patient Details  Name: Tracy Coleman MRN: [id number] Date of Birth: 21-May-1976 Referring Provider (PT): Wylene Simmer, MD   Encounter Date: 06/24/2019  PT End of Session - 06/24/19 1519    Visit Number  4    Number of Visits  13    Date for PT Re-Evaluation  08/01/19    Authorization Type  MCD- reauth submitted 7/14    Authorization Time Period  7/5-7/18    PT Start Time  1515    PT Stop Time  1556    PT Time Calculation (min)  41 min    Activity Tolerance  Patient tolerated treatment well    Behavior During Therapy  [facility] for tasks assessed/performed       Past Medical History:  Diagnosis Date  . Anxiety   . Arthritis   . Asthma   . Depression   . Hypertension   . Obesity   . PTSD (post-traumatic stress disorder)     Past Surgical History:  Procedure Laterality Date  . ARTHRODESIS METATARSALPHALANGEAL JOINT (MTPJ) Right 04/11/2018   Procedure: ARTHRODESIS METATARSALPHALANGEAL JOINT (MTPJ);  Surgeon: Wylene Simmer, MD;  Location: [place];  Service: Orthopedics;  Laterality: Right;  . FOOT ARTHRODESIS Right 01/23/2019   Procedure: Open treatment of right navicular nonunion;  Surgeon: Wylene Simmer, MD;  Location: [place];  Service: Orthopedics;  Laterality: Right;  . LEEP     x2. Last one 2013    There were no vitals filed for this visit.  Subjective Assessment - 06/24/19 1521    Subjective  I am being more deliberate with my steps and keep my feet straight. I have been working to put more pressure on my Rt foot. It is not as stiff as it used to be. I make sure to move my foot around before getting up. Still a limp and hurts some. The bottoms of my feet were sore and I could not navigate a [place].    How long can you walk comfortably?  10 min    Patient Stated Goals   30 min of walking, learn exercises, walk without limp    Currently in Pain?  Yes    Pain Score  5     Pain Location  Foot    Pain Orientation  Right;Medial   dorsal   Pain Descriptors / Indicators  Sore    Aggravating Factors   weight bearing    Pain Relieving Factors  keep ankle moving         [facility] PT Assessment - 06/24/19 0001      Assessment   Medical Diagnosis  Pain in Rt foot    Referring Provider (PT)  Wylene Simmer, MD    Onset Date/Surgical Date  01/23/19    Hand Dominance  Right    Next MD Visit  --   August, 2020   Prior Therapy  no      Precautions   Precautions  None      Restrictions   Weight Bearing Restrictions  No      Balance Screen   Has the patient fallen in the past 6 months  Yes    How many times?  1    Has the patient had a decrease in activity level because of a fear of falling?   Yes    Is the patient  reluctant to leave their home because of a fear of falling?   No      Home Film/video editor residence    Living Arrangements  Spouse/significant other;Children    Additional Comments  stairs at home      Prior Function   Level of [place] Requirements  cares for husband      Cognition   Overall Cognitive Status  Within Functional Limits for tasks assessed      Sensation   Additional Comments  denies N/T      Posture/Postural Control   Posture Comments  collapsing arch      AROM   Overall AROM Comments  0 great toe extension due to fusion    Right Ankle Dorsiflexion  6    Right Ankle Plantar Flexion  38    Right Ankle Inversion  24    Right Ankle Eversion  10      Palpation   Palpation comment  TTP medial deltoid ligaments and distal posterior tibialis      Ambulation/Gait   Gait Comments  decreased hip extension at end of stance phase of Rt leg, antalgic- improved heel toe but still has room for improvement to avoid catching toes on floor                   [facility]  Adult PT Treatment/Exercise - 06/24/19 0001      Knee/Hip Exercises: Stretches   Gastroc Stretch  Both;2 reps;30 seconds   slant board     Knee/Hip Exercises: Standing   Other Standing Knee Exercises  knee drive to wall- step back with Rt foot      Manual Therapy   Manual therapy comments  ankle PROM    Edema Management  dorsal-lateral Rt foot      Kinesiotix   Edema  anterior Rt foot- lateral side      Ankle Exercises: Aerobic   Stationary Bike  5 min L2             PT Education - 06/24/19 1701    Education Details  goals discussion, POC, HEP & MCD auth    Person(s) Educated  Patient    Methods  Explanation    Comprehension  Verbalized understanding       PT Short Term Goals - 06/24/19 1525      PT SHORT TERM GOAL #1   Title  Pt will demo at least 5 deg passive DF    Baseline  6    Status  Achieved      PT SHORT TERM GOAL #2   Title  pt will be able to perform some form of cardio exercise that does not increase ankle pain to challenge full body health    Baseline  with everything being shut down due to covid, it has been harder    Status  On-going      PT SHORT TERM GOAL #3   Title  Pt will demonstrate proper form with exercises established for short term HEP    Status  Achieved        PT Long Term Goals - 06/24/19 1657      PT LONG TERM GOAL #1   Title  Pt will be able to ambulate for at least 30 min for community navigation    Baseline  10 min    Time  5   extra week included for MCD auth period  Period  Weeks    Status  New    Target Date  08/01/19      PT LONG TERM GOAL #2   Title  Pt will demo gait pattern without antalgic presentation and equal hip extension    Baseline  see flowsheet for gait description    Time  5    Period  Weeks    Status  New    Target Date  08/01/19      PT LONG TERM GOAL #3   Title  DF ROM to 10 deg    Baseline  6    Time  5    Period  Weeks    Status  New    Target Date  08/01/19            Plan -  06/24/19 1655    Clinical Impression Statement  Pt is making progress toward functional goals and is demonstrating improvements in gait pattern. Still presents with weaknesses and ROM limitations as expected at this time with surgical intervention that was provided and will continue to benefit from skilled PT to continue toward long term functional goals.    Rehab Potential  Good    PT Frequency  2x / week    PT Duration  4 weeks    PT Treatment/Interventions  ADLs/Self Care Home Management;Cryotherapy;Electrical Stimulation;Gait training;Moist Heat;Stair training;Functional mobility training;Therapeutic activities;Therapeutic exercise;Balance training;Neuromuscular re-education;Manual techniques;Patient/family education;Scar mobilization;Passive range of motion;Dry needling;Vasopneumatic Device;Taping    PT Next Visit Plan  physioball for proximal strength, CKC strengthening    PT Home Exercise Plan  DF stretch, inversion yellow; hip abd, hip abd + ext with knee at 90, bridge, LAQ, retro stepping Rt foot    Consulted and Agree with Plan of Care  Patient       Patient will benefit from skilled therapeutic intervention in order to improve the following deficits and impairments:  Abnormal gait, Decreased range of motion, Difficulty walking, Decreased activity tolerance, Pain, Improper body mechanics, Impaired flexibility, Decreased balance, Decreased strength, Increased edema, Postural dysfunction  Visit Diagnosis: 1. Pain in right ankle and joints of right foot   2. Difficulty in walking, not elsewhere classified   3. Localized edema        Problem List Patient Active Problem List   Diagnosis Date Noted  . Allergies 05/13/2019  . Atypical chest pain 02/06/2019  . Asthma 02/06/2019  . Cervical stenosis (uterine cervix) 01/29/2019  . IUD strings lost 01/29/2019  . Cervical dysplasia 01/29/2019  . MDD (major depressive disorder), severe ([place]) 01/03/2019  . Contraception management  12/23/2018  . Pain in joint of right ankle 10/18/2018  . Acquired hallux rigidus 10/18/2018  . 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 Essential hypertension 05/03/2013  . Obesity 05/03/2013  . Anxiety 05/03/2013  . Depression 05/03/2013    Haileyann Staiger C. Delonta Yohannes PT, DPT 06/24/19 5:03 PM   [place], [place], [address] Phone: [phone]   Fax:  [phone]  Name: Tracy Coleman MRN: [id number] Date of Birth: 08/21/1976

## 2019-06-30 ENCOUNTER — Other Ambulatory Visit: Payer: Self-pay

## 2019-06-30 ENCOUNTER — Ambulatory Visit: Payer: Medicaid Other | Admitting: Obstetrics and Gynecology

## 2019-06-30 VITALS — BP 116/82 | HR 94 | Temp 97.8°F | Wt 271.0 lb

## 2019-06-30 DIAGNOSIS — N879 Dysplasia of cervix uteri, unspecified: Secondary | ICD-10-CM | POA: Diagnosis not present

## 2019-06-30 DIAGNOSIS — N882 Stricture and stenosis of cervix uteri: Secondary | ICD-10-CM | POA: Diagnosis not present

## 2019-06-30 DIAGNOSIS — T8332XD Displacement of intrauterine contraceptive device, subsequent encounter: Secondary | ICD-10-CM | POA: Diagnosis not present

## 2019-06-30 LAB — POCT PREGNANCY, URINE: Preg Test, Ur: NEGATIVE

## 2019-06-30 NOTE — Progress Notes (Signed)
Obstetrics and Gynecology Established Patient Evaluation  Appointment Date: 06/30/2019  OBGYN Clinic: [facility]  Primary Care Provider: Rory Percy  Referring Provider: Rory Percy, DO  Chief Complaint:  Chief Complaint  Patient presents with  . Abnormal bleeding  . Advice Only    History of Present Illness: Tracy Coleman is a 43 y.o. African-American G2P1011 (No LMP recorded. (Menstrual status: IUD).), seen for the above chief complaint. Her past medical history is significant for BMI 40s, cHTN, h/o LEEP x 2, expired lost Mirena in place, ascus/hpv neg but no EC/TZ seen, asthma    Patient seen by me Feb 2020 as new patient referral for abnormal pap and lost IUD at her PCP.  Exam showed completely closed off external os with no strings visible and patient set up for u/s. U/s showed IUD in correct location and two small fibroids.  I d/w pt over the phone and recommended outpatient procedure to open up cervix, colposcopy and IUD removal, but COVID occurred and all surgeries were cancelled.  Patient went to ED on 7/3 for pain and spotting but currently has no pain or bleeding. Small amount of VB with closed os at that exam. She does not have cyclic cramping.   Review of Systems: as noted in the History of Present Illness.  Patient Active Problem List   Diagnosis Date Noted  . Allergies 05/13/2019  . Atypical chest pain 02/06/2019  . Asthma 02/06/2019  . Cervical stenosis (uterine cervix) 01/29/2019  . IUD strings lost 01/29/2019  . Cervical dysplasia 01/29/2019  . MDD (major depressive disorder), severe ([place]) 01/03/2019  . Contraception management 12/23/2018  . Pain in joint of right ankle 10/18/2018  . Acquired hallux rigidus 10/18/2018  . 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 Essential hypertension 05/03/2013  . Obesity 05/03/2013  . Anxiety 05/03/2013  . Depression 05/03/2013    Past Medical History:  Past Medical History:  Diagnosis Date  .  Anxiety   . Arthritis   . Asthma   . Depression   . Hypertension   . Obesity   . PTSD (post-traumatic stress disorder)     Past Surgical History:  Past Surgical History:  Procedure Laterality Date  . ARTHRODESIS METATARSALPHALANGEAL JOINT (MTPJ) Right 04/11/2018   Procedure: ARTHRODESIS METATARSALPHALANGEAL JOINT (MTPJ);  Surgeon: Wylene Simmer, MD;  Location: [place];  Service: Orthopedics;  Laterality: Right;  . FOOT ARTHRODESIS Right 01/23/2019   Procedure: Open treatment of right navicular nonunion;  Surgeon: Wylene Simmer, MD;  Location: [place];  Service: Orthopedics;  Laterality: Right;  . LEEP     x2. Last one 2013    Past Obstetrical History:  OB History  Gravida Para Term Preterm AB Living  2 1 1   1 1   SAB TAB Ectopic Multiple Live Births  1       1    # Outcome Date GA Lbr Len/2nd Weight Sex Delivery Anes PTL Lv  2 Term 2004     Vag-Spont     1 SAB             Past Gynecological History: As per HPI.  Social History:  Social History   Socioeconomic History  . Marital status: Married    Spouse name: Not on file  . Number of children: Not on file  . Years of education: Not on file  . Highest education level: Not on file  Occupational History  . Not on file  Social Needs  .  Financial resource strain: Not on file  . Food insecurity    Worry: Not on file    Inability: Not on file  . Transportation needs    Medical: Not on file    Non-medical: Not on file  Tobacco Use  . Smoking status: Former Smoker    Packs/day: 0.50    Types: Cigarettes  . Smokeless tobacco: Never Used  . Tobacco comment: Stopped smoking for surgery 01/23/19  Substance and Sexual Activity  . Alcohol use: Yes    Frequency: Never    Comment: occasional 2-3 drinks/week  . 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 Sexual activity: Yes    Birth control/protection: I.U.D.  Lifestyle  . Physical  activity    Days per week: Not on file    Minutes per session: Not on file  . Stress: Not on file  Relationships  . Social Herbalist on phone: Not on file    Gets together: Not on file    Attends religious service: Not on file    Active member of club or organization: Not on file    Attends meetings of clubs or organizations: Not on file    Relationship status: Not on file  . Intimate partner violence    Fear of current or ex partner: Not on file    Emotionally abused: Not on file    Physically abused: Not on file    Forced sexual activity: Not on file  Other Topics Concern  . Not on file  Social History Narrative  . Not on file    Family History:  Family History  Problem Relation Age of Onset  . Alcohol abuse Mother   . Drug abuse Mother   . Depression Mother   . Early death Mother        pneumonia, age 68  . Hypertension Mother   . Alcohol abuse Father   . Drug abuse Father   . Diabetes Maternal Grandmother   . Hypertension Maternal Grandmother   . Diabetes Paternal Grandmother   . Hypertension Paternal Grandmother     Medications Tracy Coleman had no medications administered during this visit. Current Outpatient Medications  Medication Sig Dispense Refill  . albuterol (PROVENTIL HFA;VENTOLIN HFA) 108 (90 Base) MCG/ACT inhaler Inhale 2 puffs into the lungs every 4 (four) hours as needed for wheezing or shortness of breath. 1 Inhaler 0  . amLODipine (NORVASC) 5 MG tablet Take 1 tablet (5 mg total) by mouth daily. 90 tablet 3  . cetirizine (ZYRTEC) 10 MG tablet Take 1 tablet (10 mg total) by mouth daily. 30 tablet 11  . chlorthalidone (HYGROTON) 25 MG tablet Take 1 tablet (25 mg total) by mouth daily. 90 tablet 3  . famotidine (PEPCID) 20 MG tablet Take 20 mg by mouth daily.    . fluticasone (FLONASE) 50 MCG/ACT nasal spray Place 2 sprays into both nostrils daily. 16 g 6  . levonorgestrel (MIRENA) 20 MCG/24HR IUD 1 each by Intrauterine route once.     Marland Kitchen  lisinopril (PRINIVIL,ZESTRIL) 40 MG tablet Take 1 tablet (40 mg total) by mouth daily. 90 tablet 3  . venlafaxine XR (EFFEXOR-XR) 37.5 MG 24 hr capsule Take 1 capsule (37.5 mg total) by mouth daily with breakfast. For mood (Patient taking differently: Take 37.5 mg by mouth at bedtime. For mood) 30 capsule 0  . Capsaicin-Menthol-Methyl Sal (CAPSAICIN-METHYL SAL-MENTHOL) 0.025-1-12 % CREA Apply  1 application topically 4 (four) times daily. (Patient not taking: Reported on 06/30/2019) 1 Tube 0  . ketorolac (TORADOL) 10 MG tablet ketorolac 10 mg tablet  Take 1 tablet every 6 hours by oral route for 5 days.    . meloxicam (MOBIC) 15 MG tablet Take 1 tablet (15 mg total) by mouth daily. (Patient not taking: Reported on 06/30/2019) 30 tablet 0  . mirtazapine (REMERON) 15 MG tablet Take 1 tablet (15 mg total) by mouth at bedtime. For mood (Patient not taking: Reported on 06/30/2019) 30 tablet 0  . naproxen (NAPROSYN) 500 MG tablet Take 1 tablet (500 mg total) by mouth 2 (two) times daily as needed. (Patient not taking: Reported on 06/30/2019) 30 tablet 0  . senna (SENOKOT) 8.6 MG TABS tablet Take 2 tablets (17.2 mg total) by mouth 2 (two) times daily. (Patient not taking: Reported on 01/29/2019) 30 each 0   No current facility-administered medications for this visit.     Allergies Varenicline   Physical Exam:  BP 116/82   Pulse 94   Temp 97.8 F (36.6 C)   Wt 271 lb (122.9 kg)   BMI 46.52 kg/m  Body mass index is 46.52 kg/m. General appearance: Well nourished, well developed female in no acute distress.  Neuro/Psych:  Normal mood and affect.  Skin:  Warm and dry.   Pelvic exam from 01/29/19 Pelvic exam: is not limited by body habitus EGBUS: within normal limits With normal graves speculum, cervix visualized. Vaginal vault normal with no d/c or bleeding. Cervix seen and no IUD strings noted. Spatula and then cytobrush for pap done and unable to push cytobrush into os. Tenaculum applied to anterior  lip of cervix and cytobrush again used and still unable to push into cervix.  Uterus: small, nttp.  Adnexa: negative  Laboratory: UPT negative  Radiology: no new imaging.   Assessment: pt stable  Plan:  D/w pt re: course of action. I told her I recommend going to the OR and doing a cervical opening and dilation, colposcopy (with ECC and random biopsies) and mirena removal. Pt would like new mirena, so recommend replacing with new Mirena and doing vaginal estrogen to help keep cervical canal/tract open. I told her re: risk of creating false passage and damage to surrounding structures. Pt is wondering about just getting a hysterectomy. I told her that If i'm unable to safely enter the cervical canal and uterine cavity b/c of the stenosis being too severe, would she be open for a hyst at that point and she said yes. I told her that I likely could do it vaginally but wouldn't know until her surgery and if not, I'd try an LAVH vs an open procedure. Pt is amenable to plan.   RTC post op  Durene Romans MD Attending Center [facility] [employer] [facility])

## 2019-07-03 ENCOUNTER — Ambulatory Visit: Payer: Medicaid Other | Admitting: Physical Therapy

## 2019-07-03 ENCOUNTER — Other Ambulatory Visit: Payer: Self-pay

## 2019-07-03 ENCOUNTER — Encounter: Payer: Self-pay | Admitting: Physical Therapy

## 2019-07-03 DIAGNOSIS — M25571 Pain in right ankle and joints of right foot: Secondary | ICD-10-CM

## 2019-07-03 DIAGNOSIS — R262 Difficulty in walking, not elsewhere classified: Secondary | ICD-10-CM

## 2019-07-03 DIAGNOSIS — R600 Localized edema: Secondary | ICD-10-CM

## 2019-07-03 NOTE — Progress Notes (Unsigned)
Pt signed Hysterectomy Statement Form-06/30/19

## 2019-07-03 NOTE — Therapy (Signed)
[place], [place], [address] Phone: [phone]   Fax:  [phone]  Physical Therapy Treatment  Patient Details  Name: Tracy Coleman MRN: [id number] Date of Birth: [birth year] Referring Provider (PT): Wylene Simmer, MD   Encounter Date: 07/03/2019  PT End of Session - 07/03/19 1446    Visit Number  5    Number of Visits  13    Date for PT Re-Evaluation  08/01/19    Authorization Type  MCD    Authorization Time Period  7/22-8/18    Authorization - Visit Number  1    Authorization - Number of Visits  8    PT Start Time  1400    PT Stop Time  1454    PT Time Calculation (min)  54 min    Activity Tolerance  Patient tolerated treatment well    Behavior During Therapy  [facility] for tasks assessed/performed       Past Medical History:  Diagnosis Date  . Anxiety   . Arthritis   . Asthma   . Depression   . Hypertension   . Obesity   . PTSD (post-traumatic stress disorder)     Past Surgical History:  Procedure Laterality Date  . ARTHRODESIS METATARSALPHALANGEAL JOINT (MTPJ) Right 04/11/2018   Procedure: ARTHRODESIS METATARSALPHALANGEAL JOINT (MTPJ);  Surgeon: Wylene Simmer, MD;  Location: [place];  Service: Orthopedics;  Laterality: Right;  . FOOT ARTHRODESIS Right 01/23/2019   Procedure: Open treatment of right navicular nonunion;  Surgeon: Wylene Simmer, MD;  Location: [place];  Service: Orthopedics;  Laterality: Right;  . LEEP     x2. Last one 2013    There were no vitals filed for this visit.  Subjective Assessment - 07/03/19 1406    Subjective  pain on anterior aspect of ankle and medial/plantar heel.    Currently in Pain?  Yes    Pain Score  4     Pain Location  Foot    Pain Orientation  Right    Pain Descriptors / Indicators  Sharp    Aggravating Factors   weight bearing    Pain Relieving Factors  move ankle frequently                        [facility] Adult PT Treatment/Exercise - 07/03/19 0001      Therapeutic Activites    Therapeutic Activities  --   stairs     Knee/Hip Exercises: Stretches   Other Knee/Hip Stretches  stairs for talocrural stretching/mobilization      Knee/Hip Exercises: Aerobic   Nustep  5 min L5 UE & LE      Knee/Hip Exercises: Standing   Extension Limitations  elbows on physioball, extend with knee flexed    Other Standing Knee Exercises  isometric hip ext into physioball leg turned out      Modalities   Modalities  Cryotherapy      Cryotherapy   Number Minutes Cryotherapy  10 Minutes    Cryotherapy Location  Ankle    Type of Cryotherapy  Ice pack      Manual Therapy   Manual Therapy  Taping    Kinesiotex  Ligament Correction      Kinesiotix   Ligament Correction  arch support      Ankle Exercises: Stretches   Soleus Stretch  Other (comment)   using stairs- stepping back  PT Short Term Goals - 06/24/19 1525      PT SHORT TERM GOAL #1   Title  Pt will demo at least 5 deg passive DF    Baseline  6    Status  Achieved      PT SHORT TERM GOAL #2   Title  pt will be able to perform some form of cardio exercise that does not increase ankle pain to challenge full body health    Baseline  with everything being shut down due to covid, it has been harder    Status  On-going      PT SHORT TERM GOAL #3   Title  Pt will demonstrate proper form with exercises established for short term HEP    Status  Achieved        PT Long Term Goals - 06/24/19 1657      PT LONG TERM GOAL #1   Title  Pt will be able to ambulate for at least 30 min for community navigation    Baseline  10 min    Time  5   extra week included for MCD auth period   Period  Weeks    Status  New    Target Date  08/01/19      PT LONG TERM GOAL #2   Title  Pt will demo gait pattern without antalgic presentation and equal hip extension    Baseline  see flowsheet for  gait description    Time  5    Period  Weeks    Status  New    Target Date  08/01/19      PT LONG TERM GOAL #3   Title  DF ROM to 10 deg    Baseline  6    Time  5    Period  Weeks    Status  New    Target Date  08/01/19            Plan - 07/03/19 1756    Clinical Impression Statement  Practiced stairs today as limitations in great toe extension limit eccentric lowering on Rt leg. Pinching decreased with activation of hip extensors and hip extension upon toe off.    PT Treatment/Interventions  ADLs/Self Care Home Management;Cryotherapy;Electrical Stimulation;Gait training;Moist Heat;Stair training;Functional mobility training;Therapeutic activities;Therapeutic exercise;Balance training;Neuromuscular re-education;Manual techniques;Patient/family education;Scar mobilization;Passive range of motion;Dry needling;Vasopneumatic Device;Taping    PT Next Visit Plan  CKC strengthening, core on physioball    PT Home Exercise Plan  DF stretch, inversion yellow; hip abd, hip abd + ext with knee at 90, bridge, LAQ, retro stepping Rt foot; hip extension- knee flexed, extended & turnout    Consulted and Agree with Plan of Care  Patient       Patient will benefit from skilled therapeutic intervention in order to improve the following deficits and impairments:  Abnormal gait, Decreased range of motion, Difficulty walking, Decreased activity tolerance, Pain, Improper body mechanics, Impaired flexibility, Decreased balance, Decreased strength, Increased edema, Postural dysfunction  Visit Diagnosis: 1. Pain in right ankle and joints of right foot   2. Difficulty in walking, not elsewhere classified   3. Localized edema        Problem List Patient Active Problem List   Diagnosis Date Noted  . Allergies 05/13/2019  . Atypical chest pain 02/06/2019  . Asthma 02/06/2019  . Cervical stenosis (uterine cervix) 01/29/2019  . IUD strings lost 01/29/2019  . Cervical dysplasia 01/29/2019  . MDD  (major depressive disorder), severe ([place]) 01/03/2019  .  Contraception management 12/23/2018  . Pain in joint of right ankle 10/18/2018  . Acquired hallux rigidus 10/18/2018  . 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 Essential hypertension 05/03/2013  . Obesity 05/03/2013  . Anxiety 05/03/2013  . Depression 05/03/2013    Mana Haberl C. Aydin Cavalieri PT, DPT 07/03/19 6:01 PM   [place], [place], [address] Phone: [phone]   Fax:  [phone]  Name: Tracy Coleman MRN: [id number] Date of Birth: 24-Mar-1976

## 2019-07-08 ENCOUNTER — Ambulatory Visit: Payer: Medicaid Other | Admitting: Physical Therapy

## 2019-07-08 ENCOUNTER — Other Ambulatory Visit: Payer: Self-pay

## 2019-07-08 DIAGNOSIS — M25571 Pain in right ankle and joints of right foot: Secondary | ICD-10-CM

## 2019-07-08 DIAGNOSIS — R600 Localized edema: Secondary | ICD-10-CM

## 2019-07-08 DIAGNOSIS — R262 Difficulty in walking, not elsewhere classified: Secondary | ICD-10-CM

## 2019-07-08 NOTE — Therapy (Signed)
[place], [place], [address] Phone: [phone]   Fax:  [phone]  Physical Therapy Treatment  Patient Details  Name: Tracy Coleman MRN: [id number] Date of Birth: 1976/06/27 Referring Provider (PT): Tracy Simmer, MD   Encounter Date: 07/08/2019  PT End of Session - 07/08/19 1544    Visit Number  6    Number of Visits  13    Date for PT Re-Evaluation  08/01/19    Authorization Type  MCD    Authorization Time Period  7/22-8/18    Authorization - Visit Number  1    Authorization - Number of Visits  8    PT Start Time  1400    PT Stop Time  1440    PT Time Calculation (min)  40 min    Activity Tolerance  Patient tolerated treatment well    Behavior During Therapy  [facility] for tasks assessed/performed       Past Medical History:  Diagnosis Date  . Anxiety   . Arthritis   . Asthma   . Depression   . Hypertension   . Obesity   . PTSD (post-traumatic stress disorder)     Past Surgical History:  Procedure Laterality Date  . ARTHRODESIS METATARSALPHALANGEAL JOINT (MTPJ) Right 04/11/2018   Procedure: ARTHRODESIS METATARSALPHALANGEAL JOINT (MTPJ);  Surgeon: Tracy Simmer, MD;  Location: [place];  Service: Orthopedics;  Laterality: Right;  . FOOT ARTHRODESIS Right 01/23/2019   Procedure: Open treatment of right navicular nonunion;  Surgeon: Tracy Simmer, MD;  Location: [place];  Service: Orthopedics;  Laterality: Right;  . LEEP     x2. Last one 2013    There were no vitals filed for this visit.  Subjective Assessment - 07/08/19 1539    Subjective  I am trying out some arch supports in my shoes    How long can you walk comfortably?  10 min    Patient Stated Goals  30 min of walking, learn exercises, walk without limp    Currently in Pain?  Yes    Pain Score  5     Pain Location  Foot    Pain Orientation  Right                       [facility] Adult PT  Treatment/Exercise - 07/08/19 0001      Knee/Hip Exercises: Stretches   Other Knee/Hip Stretches  stairs up/down X 5 for talocrural stretching/mobilization    Other Knee/Hip Stretches  slantboard 30 sec X 3, ankle lunge stretch for DF 10 sec x 10      Knee/Hip Exercises: Aerobic   Nustep  5 min L5 UE & LE      Knee/Hip Exercises: Standing   Other Standing Knee Exercises  heel toe raises off first step 2X10    Other Standing Knee Exercises  SLS 3 X 15 sec on Rt, one X 30 sec on Lt      Knee/Hip Exercises: Supine   Other Supine Knee/Hip Exercises  ankle       Manual Therapy   Manual therapy comments  ankle and great toe PROM, great toe mobilizaitons      Ankle Exercises: Supine   T-Band  ankle 4 way X 20 each with green               PT Short Term Goals - 06/24/19 1525      PT SHORT TERM GOAL #  1   Title  Pt will demo at least 5 deg passive DF    Baseline  6    Status  Achieved      PT SHORT TERM GOAL #2   Title  pt will be able to perform some form of cardio exercise that does not increase ankle pain to challenge full body health    Baseline  with everything being shut down due to covid, it has been harder    Status  On-going      PT SHORT TERM GOAL #3   Title  Pt will demonstrate proper form with exercises established for short term HEP    Status  Achieved        PT Long Term Goals - 06/24/19 1657      PT LONG TERM GOAL #1   Title  Pt will be able to ambulate for at least 30 min for community navigation    Baseline  10 min    Time  5   extra week included for MCD auth period   Period  Weeks    Status  New    Target Date  08/01/19      PT LONG TERM GOAL #2   Title  Pt will demo gait pattern without antalgic presentation and equal hip extension    Baseline  see flowsheet for gait description    Time  5    Period  Weeks    Status  New    Target Date  08/01/19      PT LONG TERM GOAL #3   Title  DF ROM to 10 deg    Baseline  6    Time  5    Period   Weeks    Status  New    Target Date  08/01/19            Plan - 07/08/19 1545    Clinical Impression Statement  Session focused on ankle ROM and strengthening and gait to tolerance. She shows good effort with PT and will continue to benefit from PT for ROM, strength, activity tolerance, and gait.    PT Treatment/Interventions  ADLs/Self Care Home Management;Cryotherapy;Electrical Stimulation;Gait training;Moist Heat;Stair training;Functional mobility training;Therapeutic activities;Therapeutic exercise;Balance training;Neuromuscular re-education;Manual techniques;Patient/family education;Scar mobilization;Passive range of motion;Dry needling;Vasopneumatic Device;Taping    PT Next Visit Plan  CKC strengthening, core on physioball    PT Home Exercise Plan  DF stretch, inversion yellow; hip abd, hip abd + ext with knee at 90, bridge, LAQ, retro stepping Rt foot; hip extension- knee flexed, extended & turnout    Consulted and Agree with Plan of Care  Patient       Patient will benefit from skilled therapeutic intervention in order to improve the following deficits and impairments:  Abnormal gait, Decreased range of motion, Difficulty walking, Decreased activity tolerance, Pain, Improper body mechanics, Impaired flexibility, Decreased balance, Decreased strength, Increased edema, Postural dysfunction  Visit Diagnosis: 1. Pain in right ankle and joints of right foot   2. Difficulty in walking, not elsewhere classified   3. Localized edema        Problem List Patient Active Problem List   Diagnosis Date Noted  . Allergies 05/13/2019  . Atypical chest pain 02/06/2019  . Asthma 02/06/2019  . Cervical stenosis (uterine cervix) 01/29/2019  . IUD strings lost 01/29/2019  . Cervical dysplasia 01/29/2019  . MDD (major depressive disorder), severe ([place]) 01/03/2019  . Contraception management 12/23/2018  . Pain in joint of right ankle 10/18/2018  .  Acquired hallux rigidus 10/18/2018  .  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 Essential hypertension 05/03/2013  . Obesity 05/03/2013  . Anxiety 05/03/2013  . Depression 05/03/2013    [name] 07/08/2019, 3:47 PM  [facility] [address] [place], [place], [address] Phone: [phone]   Fax:  [phone]  Name: Tracy Coleman MRN: [id number] Date of Birth: February 18, 1976

## 2019-07-10 ENCOUNTER — Ambulatory Visit: Payer: Medicaid Other | Admitting: Physical Therapy

## 2019-07-15 ENCOUNTER — Other Ambulatory Visit: Payer: Self-pay

## 2019-07-15 ENCOUNTER — Ambulatory Visit: Payer: Medicaid Other | Attending: Orthopedic Surgery | Admitting: Physical Therapy

## 2019-07-15 ENCOUNTER — Encounter: Payer: Self-pay | Admitting: Physical Therapy

## 2019-07-15 DIAGNOSIS — M25571 Pain in right ankle and joints of right foot: Secondary | ICD-10-CM | POA: Diagnosis not present

## 2019-07-15 DIAGNOSIS — R600 Localized edema: Secondary | ICD-10-CM | POA: Insufficient documentation

## 2019-07-15 DIAGNOSIS — R262 Difficulty in walking, not elsewhere classified: Secondary | ICD-10-CM | POA: Insufficient documentation

## 2019-07-15 NOTE — Therapy (Signed)
[place], [place], [address] Phone: [phone]   Fax:  [phone]  Physical Therapy Treatment  Patient Details  Name: Tracy Coleman MRN: [id number] Date of Birth: 1976-03-26 Referring Provider (PT): Wylene Simmer, MD   Encounter Date: 07/15/2019  PT End of Session - 07/15/19 1420    Visit Number  7    Number of Visits  13    Date for PT Re-Evaluation  08/01/19    Authorization Type  MCD    Authorization Time Period  7/22-8/18    Authorization - Visit Number  2    Authorization - Number of Visits  8    PT Start Time  3149    PT Stop Time  1454    PT Time Calculation (min)  34 min    Activity Tolerance  Patient tolerated treatment well;Patient limited by pain    Behavior During Therapy  [name] for tasks assessed/performed       Past Medical History:  Diagnosis Date  . Anxiety   . Arthritis   . Asthma   . Depression   . Hypertension   . Obesity   . PTSD (post-traumatic stress disorder)     Past Surgical History:  Procedure Laterality Date  . ARTHRODESIS METATARSALPHALANGEAL JOINT (MTPJ) Right 04/11/2018   Procedure: ARTHRODESIS METATARSALPHALANGEAL JOINT (MTPJ);  Surgeon: Wylene Simmer, MD;  Location: [place];  Service: Orthopedics;  Laterality: Right;  . FOOT ARTHRODESIS Right 01/23/2019   Procedure: Open treatment of right navicular nonunion;  Surgeon: Wylene Simmer, MD;  Location: [place];  Service: Orthopedics;  Laterality: Right;  . LEEP     x2. Last one 2013    There were no vitals filed for this visit.  Subjective Assessment - 07/15/19 1423    Subjective  Still has some stiffness at incision. Can tell the arch of my foot is a problem.    Patient Stated Goals  30 min of walking, learn exercises, walk without limp    Currently in Pain?  Yes    Pain Score  3     Pain Location  Foot    Pain Orientation  Right    Pain Descriptors / Indicators  Aching;Sore          [facility] PT Assessment - 07/15/19 0001      ROM / Strength   AROM / PROM / Strength  Strength      AROM   Right Ankle Dorsiflexion  10      Strength   Overall Strength Comments  gross hip strength 5/5      Palpation   Palpation comment  mild TTP deltoid ligaments, TTP distal tib tendon proximal to med malleolus                   [facility] Adult PT Treatment/Exercise - 07/15/19 0001      Knee/Hip Exercises: Supine   Straight Leg Raises  Both;10 reps    Straight Leg Raises Limitations  cues for core tightness      Manual Therapy   Soft tissue mobilization  scar mobilization      Ankle Exercises: Seated   BAPS  Level 2   both directions     Ankle Exercises: Standing   SLS  short time balance    Rebounder  A/P, static & dynamic    Other Standing Ankle Exercises  step fwd onto Rt foot  PT Short Term Goals - 06/24/19 1525      PT SHORT TERM GOAL #1   Title  Pt will demo at least 5 deg passive DF    Baseline  6    Status  Achieved      PT SHORT TERM GOAL #2   Title  pt will be able to perform some form of cardio exercise that does not increase ankle pain to challenge full body health    Baseline  with everything being shut down due to covid, it has been harder    Status  On-going      PT SHORT TERM GOAL #3   Title  Pt will demonstrate proper form with exercises established for short term HEP    Status  Achieved        PT Long Term Goals - 06/24/19 1657      PT LONG TERM GOAL #1   Title  Pt will be able to ambulate for at least 30 min for community navigation    Baseline  10 min    Time  5   extra week included for MCD auth period   Period  Weeks    Status  New    Target Date  08/01/19      PT LONG TERM GOAL #2   Title  Pt will demo gait pattern without antalgic presentation and equal hip extension    Baseline  see flowsheet for gait description    Time  5    Period  Weeks    Status  New    Target Date  08/01/19       PT LONG TERM GOAL #3   Title  DF ROM to 10 deg    Baseline  6    Time  5    Period  Weeks    Status  New    Target Date  08/01/19            Plan - 07/15/19 1454    Clinical Impression Statement  Limited by back pain in SLS and standing activities. Weakness in quads noted so SLR with ab set was added to HEP. Significant improvement in DF ROM as well as hip abd strength. Asked her to wear inserts consistently until her next appointment to determine amount of assistance provided.    PT Treatment/Interventions  ADLs/Self Care Home Management;Cryotherapy;Electrical Stimulation;Gait training;Moist Heat;Stair training;Functional mobility training;Therapeutic activities;Therapeutic exercise;Balance training;Neuromuscular re-education;Manual techniques;Patient/family education;Scar mobilization;Passive range of motion;Dry needling;Vasopneumatic Device;Taping    PT Next Visit Plan  did she wear arch supports consistently? continue core strength    PT Home Exercise Plan  DF stretch, inversion yellow; hip abd, hip abd + ext with knee at 90, bridge, LAQ, retro stepping Rt foot; hip extension- knee flexed, extended & turnout; SLR+ab set    Consulted and Agree with Plan of Care  Patient       Patient will benefit from skilled therapeutic intervention in order to improve the following deficits and impairments:  Abnormal gait, Decreased range of motion, Difficulty walking, Decreased activity tolerance, Pain, Improper body mechanics, Impaired flexibility, Decreased balance, Decreased strength, Increased edema, Postural dysfunction  Visit Diagnosis: 1. Pain in right ankle and joints of right foot   2. Difficulty in walking, not elsewhere classified   3. Localized edema        Problem List Patient Active Problem List   Diagnosis Date Noted  . Allergies 05/13/2019  . Atypical chest pain 02/06/2019  . Asthma 02/06/2019  .  Cervical stenosis (uterine cervix) 01/29/2019  . IUD strings lost  01/29/2019  . Cervical dysplasia 01/29/2019  . MDD (major depressive disorder), severe ([place]) 01/03/2019  . Contraception management 12/23/2018  . Pain in joint of right ankle 10/18/2018  . Acquired hallux rigidus 10/18/2018  . 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 Essential hypertension 05/03/2013  . Obesity 05/03/2013  . Anxiety 05/03/2013  . Depression 05/03/2013   Kharee Lesesne C. Gretna Bergin PT, DPT 07/15/19 2:59 PM   [facility] [address] [place], [place], [address] Phone: [phone]   Fax:  [phone]  Name: Tracy Coleman MRN: [id number] Date of Birth: 1976-10-17

## 2019-07-16 ENCOUNTER — Encounter: Payer: Self-pay | Admitting: *Deleted

## 2019-07-17 ENCOUNTER — Encounter: Payer: Self-pay | Admitting: Physical Therapy

## 2019-07-17 ENCOUNTER — Other Ambulatory Visit: Payer: Self-pay

## 2019-07-17 ENCOUNTER — Ambulatory Visit: Payer: Medicaid Other | Admitting: Physical Therapy

## 2019-07-17 DIAGNOSIS — M25571 Pain in right ankle and joints of right foot: Secondary | ICD-10-CM | POA: Diagnosis not present

## 2019-07-17 DIAGNOSIS — R600 Localized edema: Secondary | ICD-10-CM

## 2019-07-17 DIAGNOSIS — R262 Difficulty in walking, not elsewhere classified: Secondary | ICD-10-CM

## 2019-07-17 NOTE — Therapy (Signed)
[place], [place], [address] Phone: [phone]   Fax:  [phone]  Physical Therapy Treatment  Patient Details  Name: Tracy Coleman MRN: [id number] Date of Birth: Mar 06, 1976 Referring Provider (PT): Wylene Simmer, MD   Encounter Date: 07/17/2019  PT End of Session - 07/17/19 1415    Visit Number  8    Number of Visits  13    Date for PT Re-Evaluation  08/01/19    Authorization Time Period  7/22-8/18    Authorization - Visit Number  3    Authorization - Number of Visits  8    PT Start Time  2637    PT Stop Time  1449    PT Time Calculation (min)  34 min    Activity Tolerance  Patient tolerated treatment well    Behavior During Therapy  [facility] for tasks assessed/performed       Past Medical History:  Diagnosis Date  . Anxiety   . Arthritis   . Asthma   . Depression   . Hypertension   . Obesity   . PTSD (post-traumatic stress disorder)     Past Surgical History:  Procedure Laterality Date  . ARTHRODESIS METATARSALPHALANGEAL JOINT (MTPJ) Right 04/11/2018   Procedure: ARTHRODESIS METATARSALPHALANGEAL JOINT (MTPJ);  Surgeon: Wylene Simmer, MD;  Location: [place];  Service: Orthopedics;  Laterality: Right;  . FOOT ARTHRODESIS Right 01/23/2019   Procedure: Open treatment of right navicular nonunion;  Surgeon: Wylene Simmer, MD;  Location: [place];  Service: Orthopedics;  Laterality: Right;  . LEEP     x2. Last one 2013    There were no vitals filed for this visit.  Subjective Assessment - 07/17/19 1419    Subjective  I have been doing the desensitization. It is feeling a litte better.    Patient Stated Goals  30 min of walking, learn exercises, walk without limp                       [facility] Adult PT Treatment/Exercise - 07/17/19 0001      Pilates   Pilates Reformer  see note           Pilates Reformer used for LE/core strength, postural  strength, lumbopelvic disassociation and core control.  Exercises included:  Footwork: 2R1B squat on jump board, 1R1B jump double foot, 2R1B heel raises, 1R1B single leg press away from board with turnout; on bar: 1R1B parallel on toes, 2R1B turnout on heels  Bridging: all springs x15  Supine hamstring stretch with pillow case      PT Short Term Goals - 06/24/19 1525      PT SHORT TERM GOAL #1   Title  Pt will demo at least 5 deg passive DF    Baseline  6    Status  Achieved      PT SHORT TERM GOAL #2   Title  pt will be able to perform some form of cardio exercise that does not increase ankle pain to challenge full body health    Baseline  with everything being shut down due to covid, it has been harder    Status  On-going      PT SHORT TERM GOAL #3   Title  Pt will demonstrate proper form with exercises established for short term HEP    Status  Achieved        PT Long Term Goals - 06/24/19 1657  PT LONG TERM GOAL #1   Title  Pt will be able to ambulate for at least 30 min for community navigation    Baseline  10 min    Time  5   extra week included for MCD auth period   Period  Weeks    Status  New    Target Date  08/01/19      PT LONG TERM GOAL #2   Title  Pt will demo gait pattern without antalgic presentation and equal hip extension    Baseline  see flowsheet for gait description    Time  5    Period  Weeks    Status  New    Target Date  08/01/19      PT LONG TERM GOAL #3   Title  DF ROM to 10 deg    Baseline  6    Time  5    Period  Weeks    Status  New    Target Date  08/01/19            Plan - 07/17/19 1449    Clinical Impression Statement  Utilized reformer for increased muscular activation and cardio challenge without increasing back pain. Pt denied pain in ankle but fatigued quickly. Is anxious about her husbands LVAD surgery next week. Asked her to wear inserts consistently as she said she wore them on/off and to keep track of time  ankle allows for ambulation vs when back limits her.    PT Treatment/Interventions  ADLs/Self Care Home Management;Cryotherapy;Electrical Stimulation;Gait training;Moist Heat;Stair training;Functional mobility training;Therapeutic activities;Therapeutic exercise;Balance training;Neuromuscular re-education;Manual techniques;Patient/family education;Scar mobilization;Passive range of motion;Dry needling;Vasopneumatic Device;Taping    PT Next Visit Plan  continue reformer    PT Home Exercise Plan  DF stretch, inversion yellow; hip abd, hip abd + ext with knee at 90, bridge, LAQ, retro stepping Rt foot; hip extension- knee flexed, extended & turnout; SLR+ab set    Consulted and Agree with Plan of Care  Patient       Patient will benefit from skilled therapeutic intervention in order to improve the following deficits and impairments:  Abnormal gait, Decreased range of motion, Difficulty walking, Decreased activity tolerance, Pain, Improper body mechanics, Impaired flexibility, Decreased balance, Decreased strength, Increased edema, Postural dysfunction  Visit Diagnosis: 1. Pain in right ankle and joints of right foot   2. Difficulty in walking, not elsewhere classified   3. Localized edema        Problem List Patient Active Problem List   Diagnosis Date Noted  . Allergies 05/13/2019  . Atypical chest pain 02/06/2019  . Asthma 02/06/2019  . Cervical stenosis (uterine cervix) 01/29/2019  . IUD strings lost 01/29/2019  . Cervical dysplasia 01/29/2019  . MDD (major depressive disorder), severe ([place]) 01/03/2019  . Contraception management 12/23/2018  . Pain in joint of right ankle 10/18/2018  . Acquired hallux rigidus 10/18/2018  . 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 Essential hypertension 05/03/2013  . Obesity 05/03/2013  . Anxiety 05/03/2013  . Depression 05/03/2013    Jayden Rudge C. Suezette Lafave PT, DPT 07/17/19 2:52 PM   [facility] [address] [place], [place], [address] Phone: [phone]   Fax:  [phone]  Name: Vidya Bamford MRN: [id number] Date of Birth: 1976-10-06

## 2019-07-22 ENCOUNTER — Ambulatory Visit: Payer: Medicaid Other | Admitting: Physical Therapy

## 2019-07-22 ENCOUNTER — Other Ambulatory Visit: Payer: Self-pay

## 2019-07-22 ENCOUNTER — Encounter: Payer: Self-pay | Admitting: Physical Therapy

## 2019-07-22 DIAGNOSIS — M25571 Pain in right ankle and joints of right foot: Secondary | ICD-10-CM

## 2019-07-22 DIAGNOSIS — R600 Localized edema: Secondary | ICD-10-CM

## 2019-07-22 DIAGNOSIS — R262 Difficulty in walking, not elsewhere classified: Secondary | ICD-10-CM

## 2019-07-22 NOTE — Therapy (Addendum)
[place], [place], [address] Phone: [phone]   Fax:  [phone]  Physical Therapy Treatment/Discharge  Patient Details  Name: Tracy Coleman MRN: [id number] Date of Birth: [birth year] Referring Provider (PT): Wylene Simmer, MD   Encounter Date: 07/22/2019  PT End of Session - 07/22/19 1427    Visit Number  9    Number of Visits  13    Date for PT Re-Evaluation  08/01/19    Authorization Type  MCD    Authorization Time Period  7/22-8/18    Authorization - Visit Number  4    Authorization - Number of Visits  8    PT Start Time  9735   pt arrived late   PT Stop Time  1459    PT Time Calculation (min)  34 min    Activity Tolerance  Patient tolerated treatment well    Behavior During Therapy  [facility] for tasks assessed/performed       Past Medical History:  Diagnosis Date  . Anxiety   . Arthritis   . Asthma   . Depression   . Hypertension   . Obesity   . PTSD (post-traumatic stress disorder)     Past Surgical History:  Procedure Laterality Date  . ARTHRODESIS METATARSALPHALANGEAL JOINT (MTPJ) Right 04/11/2018   Procedure: ARTHRODESIS METATARSALPHALANGEAL JOINT (MTPJ);  Surgeon: Wylene Simmer, MD;  Location: [place];  Service: Orthopedics;  Laterality: Right;  . FOOT ARTHRODESIS Right 01/23/2019   Procedure: Open treatment of right navicular nonunion;  Surgeon: Wylene Simmer, MD;  Location: [place];  Service: Orthopedics;  Laterality: Right;  . LEEP     x2. Last one 2013    There were no vitals filed for this visit.  Subjective Assessment - 07/22/19 1428    Subjective  Ankle is feeling good. trying to focus on walking straight and keep my posture up.    Patient Stated Goals  30 min of walking, learn exercises, walk without limp    Currently in Pain?  No/denies                       [facility] Adult PT Treatment/Exercise - 07/22/19 0001      Knee/Hip  Exercises: Stretches   Passive Hamstring Stretch Limitations  seated edge of bed    Piriformis Stretch Limitations  seated figure 4      Knee/Hip Exercises: Supine   Bridges Limitations  in dorsiflexion, blue band over waist    Other Supine Knee/Hip Exercises  tabletop press blue tband    Other Supine Knee/Hip Exercises  single leg press blue band with leg turned out      Knee/Hip Exercises: Sidelying   Hip ABduction Limitations  band around foot, anchored at hands      Ankle Exercises: Standing   Other Standing Ankle Exercises  tandem balance               PT Short Term Goals - 06/24/19 1525      PT SHORT TERM GOAL #1   Title  Pt will demo at least 5 deg passive DF    Baseline  6    Status  Achieved      PT SHORT TERM GOAL #2   Title  pt will be able to perform some form of cardio exercise that does not increase ankle pain to challenge full body health    Baseline  with everything being shut down  due to covid, it has been harder    Status  On-going      PT SHORT TERM GOAL #3   Title  Pt will demonstrate proper form with exercises established for short term HEP    Status  Achieved        PT Long Term Goals - 06/24/19 1657      PT LONG TERM GOAL #1   Title  Pt will be able to ambulate for at least 30 min for community navigation    Baseline  10 min    Time  5   extra week included for MCD auth period   Period  Weeks    Status  New    Target Date  08/01/19      PT LONG TERM GOAL #2   Title  Pt will demo gait pattern without antalgic presentation and equal hip extension    Baseline  see flowsheet for gait description    Time  5    Period  Weeks    Status  New    Target Date  08/01/19      PT LONG TERM GOAL #3   Title  DF ROM to 10 deg    Baseline  6    Time  5    Period  Weeks    Status  New    Target Date  08/01/19            Plan - 07/22/19 1500    Clinical Impression Statement  Changed exercises from reformer to perform at home using  bands for resistance. Reminders required to protect back. Reported feeling that her feet hurt (plantar aspect) after doing tandem balance. Denies wearing arch supports but has been wearing a tennis shoe with a high arch.    PT Treatment/Interventions  ADLs/Self Care Home Management;Cryotherapy;Electrical Stimulation;Gait training;Moist Heat;Stair training;Functional mobility training;Therapeutic activities;Therapeutic exercise;Balance training;Neuromuscular re-education;Manual techniques;Patient/family education;Scar mobilization;Passive range of motion;Dry needling;Vasopneumatic Device;Taping    PT Next Visit Plan  update HEP    PT Home Exercise Plan  DF stretch, inversion yellow; hip abd, hip abd + ext with knee at 90, bridge, LAQ, retro stepping Rt foot; hip extension- knee flexed, extended & turnout; SLR+ab set; TTpress double & single leg, bridge band resist over waist, tandem    Consulted and Agree with Plan of Care  Patient       Patient will benefit from skilled therapeutic intervention in order to improve the following deficits and impairments:  Abnormal gait, Decreased range of motion, Difficulty walking, Decreased activity tolerance, Pain, Improper body mechanics, Impaired flexibility, Decreased balance, Decreased strength, Increased edema, Postural dysfunction  Visit Diagnosis: 1. Pain in right ankle and joints of right foot   2. Difficulty in walking, not elsewhere classified   3. Localized edema        Problem List Patient Active Problem List   Diagnosis Date Noted  . Allergies 05/13/2019  . Atypical chest pain 02/06/2019  . Asthma 02/06/2019  . Cervical stenosis (uterine cervix) 01/29/2019  . IUD strings lost 01/29/2019  . Cervical dysplasia 01/29/2019  . MDD (major depressive disorder), severe ([place]) 01/03/2019  . Contraception management 12/23/2018  . Pain in joint of right ankle 10/18/2018  . Acquired hallux rigidus 10/18/2018  . 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  Essential hypertension 05/03/2013  . Obesity 05/03/2013  . Anxiety 05/03/2013  . Depression 05/03/2013  Renn Stille C. Diezel Mazur PT, DPT 07/22/19 3:02 PM   [place]  [place], [place], [address] Phone: [phone]   Fax:  [phone]  Name: Tracy Coleman MRN: [id number] Date of Birth: 04-29-1976  PHYSICAL THERAPY DISCHARGE SUMMARY  Visits from Start of Care: 9  Current functional level related to goals / functional outcomes: See above   Remaining deficits: See above   Education / Equipment: Anatomy of condition, POC, HEP, exercise form/rationale  Plan: Patient agrees to discharge.  Patient goals were not met. Patient is being discharged due to not returning since the last visit.  ?????    Capricia Serda C. Romir Klimowicz PT, DPT 09/19/19 11:54 AM

## 2019-07-24 ENCOUNTER — Ambulatory Visit: Payer: Medicaid Other | Admitting: Physical Therapy

## 2019-07-24 ENCOUNTER — Telehealth: Payer: Self-pay | Admitting: Physical Therapy

## 2019-07-24 NOTE — Telephone Encounter (Signed)
Pt reports having to move her daughter into her dorm today and could not make it back in time.  Calle Schader C. Breydon Senters PT, DPT 07/24/19 2:40 PM

## 2019-07-30 ENCOUNTER — Encounter (HOSPITAL_COMMUNITY): Payer: Self-pay | Admitting: Emergency Medicine

## 2019-07-30 ENCOUNTER — Ambulatory Visit (HOSPITAL_COMMUNITY)
Admission: EM | Admit: 2019-07-30 | Discharge: 2019-07-30 | Disposition: A | Discharge status: Home / Self Care | Payer: Medicaid Other | Attending: Family Medicine | Admitting: Family Medicine

## 2019-07-30 ENCOUNTER — Other Ambulatory Visit: Payer: Self-pay

## 2019-07-30 DIAGNOSIS — R51 Headache: Secondary | ICD-10-CM | POA: Insufficient documentation

## 2019-07-30 DIAGNOSIS — R519 Headache, unspecified: Secondary | ICD-10-CM

## 2019-07-30 DIAGNOSIS — R42 Dizziness and giddiness: Secondary | ICD-10-CM

## 2019-07-30 LAB — BASIC METABOLIC PANEL
Anion gap: 11 (ref 5–15)
BUN: 13 mg/dL (ref 6–20)
CO2: 27 mmol/L (ref 22–32)
Calcium: 9.8 mg/dL (ref 8.9–10.3)
Chloride: 101 mmol/L (ref 98–111)
Creatinine, Ser: 0.93 mg/dL (ref 0.44–1.00)
GFR calc Af Amer: >60 mL/min (ref >60)
GFR calc non Af Amer: >60 mL/min (ref >60)
Glucose, Bld: 123 mg/dL — ABNORMAL HIGH (ref 70–99)
Potassium: 3.4 mmol/L — ABNORMAL LOW (ref 3.5–5.1)
Sodium: 139 mmol/L (ref 135–145)

## 2019-07-30 LAB — CBC
HCT: 41 % (ref 36.0–46.0)
Hemoglobin: 14.1 g/dL (ref 12.0–15.0)
MCH: 31.6 pg (ref 26.0–34.0)
MCHC: 34.4 g/dL (ref 30.0–36.0)
MCV: 91.9 fL (ref 80.0–100.0)
Platelets: 427 10*3/uL — ABNORMAL HIGH (ref 150–400)
RBC: 4.46 MIL/uL (ref 3.87–5.11)
RDW: 14.5 % (ref 11.5–15.5)
WBC: 10 10*3/uL (ref 4.0–10.5)
nRBC: 0 % (ref 0.0–0.2)

## 2019-07-30 MED ORDER — KETOROLAC TROMETHAMINE 60 MG/2ML IM SOLN
60.0000 mg | Freq: Once | INTRAMUSCULAR | Status: AC
  Administered 2019-07-30: 60 mg via INTRAMUSCULAR

## 2019-07-30 MED ORDER — MECLIZINE HCL 12.5 MG PO TABS: 12.5000 mg | ORAL_TABLET | Freq: Three times a day (TID) | ORAL | 0 refills | Status: DC | PRN

## 2019-07-30 MED ORDER — DEXAMETHASONE SODIUM PHOSPHATE 10 MG/ML IJ SOLN
INTRAMUSCULAR | Status: AC
  Filled 2019-07-30: qty 1

## 2019-07-30 MED ORDER — KETOROLAC TROMETHAMINE 60 MG/2ML IM SOLN
INTRAMUSCULAR | Status: AC
  Filled 2019-07-30: qty 2

## 2019-07-30 MED ORDER — DEXAMETHASONE SODIUM PHOSPHATE 10 MG/ML IJ SOLN
10.0000 mg | Freq: Once | INTRAMUSCULAR | Status: AC
  Administered 2019-07-30: 10 mg via INTRAMUSCULAR

## 2019-07-30 NOTE — ED Provider Notes (Signed)
[facility]    CSN: [id number] Arrival date & time: 07/30/19  1416     History   Chief Complaint Chief Complaint  Patient presents with  . Headache    HPI Tracy Coleman is a 43 y.o. female.   Tracy Coleman presents with complaints of headache and dizziness. Started last week, she was under a lot of stress at the time which she attributed to the dizzy episodes. She feels like they have increased in frequency. Frontal headache for the past week. Can go to sleep with it and wake up with it. Has had somewhat similar in the past related to her sinuses. She has been trying a neti pot, allergy medication (Alavert) and tylenol which haven't helped. Dizziness worsens with position changes. No head injury. Some pain to bilateral ears. No sore throat, cough or congestion. No chest pain . She feels slightly dizzy currently. No nausea. Denies history of migraine. History  Of anxiety, arthritis, asthma, depression, htn, ptsd, obesity. She is taking her BP medications. Denies any blood in stool or heavy vaginal bleeding. She endorses staying hydrated recently.     ROS per HPI, negative if not otherwise mentioned.      Past Medical History:  Diagnosis Date  . Anxiety   . Arthritis   . Asthma   . Depression   . Hypertension   . Obesity   . PTSD (post-traumatic stress disorder)     Patient Active Problem List   Diagnosis Date Noted  . Allergies 05/13/2019  . Atypical chest pain 02/06/2019  . Asthma 02/06/2019  . Cervical stenosis (uterine cervix) 01/29/2019  . IUD strings lost 01/29/2019  . Cervical dysplasia 01/29/2019  . MDD (major depressive disorder), severe ([place]) 01/03/2019  . Contraception management 12/23/2018  . Pain in joint of right ankle 10/18/2018  . Acquired hallux rigidus 10/18/2018  . 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 Essential hypertension 05/03/2013  . Obesity 05/03/2013  . Anxiety 05/03/2013  . Depression 05/03/2013    Past Surgical History:   Procedure Laterality Date  . ARTHRODESIS METATARSALPHALANGEAL JOINT (MTPJ) Right 04/11/2018   Procedure: ARTHRODESIS METATARSALPHALANGEAL JOINT (MTPJ);  Surgeon: Wylene Simmer, MD;  Location: [facility];  Service: Orthopedics;  Laterality: Right;  . FOOT ARTHRODESIS Right 01/23/2019   Procedure: Open treatment of right navicular nonunion;  Surgeon: Wylene Simmer, MD;  Location: [facility];  Service: Orthopedics;  Laterality: Right;  . LEEP     x2. Last one 2013    OB History    Gravida  2   Para  1   Term  1   Preterm      AB  1   Living  1     SAB  1   TAB      Ectopic      Multiple      Live Births  1            Home Medications    Prior to Admission medications   Medication Sig Start Date End Date Taking? Authorizing Provider  albuterol (PROVENTIL HFA;VENTOLIN HFA) 108 (90 Base) MCG/ACT inhaler Inhale 2 puffs into the lungs every 4 (four) hours as needed for wheezing or shortness of breath. 02/06/19   Taylor Bing, DO  amLODipine (NORVASC) 5 MG tablet Take 1 tablet (5 mg total) by mouth daily. 09/20/18   Rory Percy, DO  Capsaicin-Menthol-Methyl Sal (CAPSAICIN-METHYL SAL-MENTHOL) 0.025-1-12 % CREA Apply 1 application topically 4 (four) times daily. Patient not taking:  Reported on 06/30/2019 02/06/19   [place] Bing, DO  cetirizine (ZYRTEC) 10 MG tablet Take 1 tablet (10 mg total) by mouth daily. 05/13/19   Diallo, Earna Coder, MD  chlorthalidone (HYGROTON) 25 MG tablet Take 1 tablet (25 mg total) by mouth daily. 09/20/18   Rory Percy, DO  famotidine (PEPCID) 20 MG tablet Take 20 mg by mouth daily.    [provider]  fluticasone (FLONASE) 50 MCG/ACT nasal spray Place 2 sprays into both nostrils daily. 05/13/19   Diallo, Earna Coder, MD  ketorolac (TORADOL) 10 MG tablet ketorolac 10 mg tablet  Take 1 tablet every 6 hours by oral route for 5 days.    [provider]  levonorgestrel (MIRENA) 20 MCG/24HR IUD 1 each  by Intrauterine route once.     [provider]  lisinopril (PRINIVIL,ZESTRIL) 40 MG tablet Take 1 tablet (40 mg total) by mouth daily. 09/20/18   Rory Percy, DO  meclizine (ANTIVERT) 12.5 MG tablet Take 1 tablet (12.5 mg total) by mouth 3 (three) times daily as needed for dizziness. 07/30/19   Zigmund Gottron, NP  meloxicam (MOBIC) 15 MG tablet Take 1 tablet (15 mg total) by mouth daily. Patient not taking: Reported on 06/30/2019 02/06/19   Cameron Bing, DO  mirtazapine (REMERON) 15 MG tablet Take 1 tablet (15 mg total) by mouth at bedtime. For mood Patient not taking: Reported on 06/30/2019 01/06/19   Connye Burkitt, NP  naproxen (NAPROSYN) 500 MG tablet Take 1 tablet (500 mg total) by mouth 2 (two) times daily as needed. Patient not taking: Reported on 06/30/2019 02/04/19   Ezequiel Essex, MD  senna (SENOKOT) 8.6 MG TABS tablet Take 2 tablets (17.2 mg total) by mouth 2 (two) times daily. Patient not taking: Reported on 01/29/2019 01/23/19   Corky Sing, PA-C  venlafaxine XR (EFFEXOR-XR) 37.5 MG 24 hr capsule Take 1 capsule (37.5 mg total) by mouth daily with breakfast. For mood Patient taking differently: Take 37.5 mg by mouth at bedtime. For mood 01/07/19   Connye Burkitt, NP  enoxaparin (LOVENOX) 40 MG/0.4ML injection Inject 0.4 mLs (40 mg total) into the skin daily. Patient not taking: Reported on 01/29/2019 01/23/19 06/13/19  Corky Sing, PA-C    Family History Family History  Problem Relation Age of Onset  . Alcohol abuse Mother   . Drug abuse Mother   . Depression Mother   . Early death Mother        pneumonia, age 21  . Hypertension Mother   . Alcohol abuse Father   . Drug abuse Father   . Diabetes Maternal Grandmother   . Hypertension Maternal Grandmother   . Diabetes Paternal Grandmother   . Hypertension Paternal Grandmother     Social History Social History   Tobacco Use  . Smoking status: Former Smoker    Packs/day: 0.50    Types: Cigarettes   . Smokeless tobacco: Never Used  . Tobacco comment: Stopped smoking for surgery 01/23/19  Substance Use Topics  . Alcohol use: Yes    Frequency: Never    Comment: occasional 2-3 drinks/week  . 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      Excl. in [place]?    Orthostatic VS for the past 24 hrs:  BP- Lying Pulse- Lying BP- Sitting Pulse- Sitting  07/30/19 1520 124/88 96 129/88 103    Updated Vital Signs BP (!) 146/99 (BP Location: Right Arm)   Pulse (!) 101   Temp 98.2 F (36.8 C) (Oral)   Resp 18   SpO2 95%    Physical Exam Constitutional:      General: She is not in acute distress.    Appearance: She is well-developed.  Cardiovascular:     Rate and Rhythm: Regular rhythm.     Heart sounds: Normal heart sounds.  Pulmonary:     Effort: Pulmonary effort is normal.     Breath sounds: Normal breath sounds.  Skin:    General: Skin is warm and dry.  Neurological:     Mental Status: She is alert and oriented to person, place, and time. Mental status is at baseline.     Cranial Nerves: No cranial nerve deficit.     Sensory: No sensory deficit.     Motor: No weakness.     Coordination: Romberg sign negative. Coordination normal.     Gait: Gait normal.     Comments: Slight dizziness with attempted dix hallpike to the left      UC Treatments / Results  Labs (all labs ordered are listed, but only abnormal results are displayed) Labs Reviewed  CBC  BASIC METABOLIC PANEL    EKG   Radiology No results found.  Procedures  Procedures (including critical care time)  Medications Ordered in UC Medications  ketorolac (TORADOL) injection 60 mg (60 mg Intramuscular Given 07/30/19 1520)  dexamethasone (DECADRON) injection 10 mg (10 mg Intramuscular Given 07/30/19 1520)  dexamethasone (DECADRON) 10 MG/ML injection (has no administration in time range)  ketorolac (TORADOL) 60 MG/2ML injection (has no administration in time range)    Initial Impression / Assessment and Plan / UC Course  I have reviewed the triage vital signs and the nursing notes.  Pertinent labs & imaging results that were available during my care of the patient were reviewed by me and considered in my medical decision making (see chart for details).    Noted mild tachycardia, per chart review this does appear approximately baseline for patient, however. BP is slightly elevated. Negative orthostatic vitals. Basic labs obtained. Will notify of any positive findings and if any changes to treatment are needed.  Treatment with decadron and toradol to treat headache and/or sinus pressure. Meclizine prn. This is new for patient however, no history of migraines or headaches. Do recommend further eval or ER visit if any worsening or persistent symptoms. Continue to follow with PCP. Patient verbalized understanding and agreeable to plan. Ambulatory out of clinic without difficulty.    Final Clinical Impressions(s) / UC Diagnoses   Final diagnoses:  Dizziness  Acute nonintractable headache, unspecified headache type     Discharge Instructions     You may try Meclizine (antivert) this afternoon in addition to medications given here today.  Continue with daily flonase, use of neti pot as needed.  If no improvement of your symptoms or any worsening of symptoms please go to the Er as you will likely need further evaluation as these are new symptoms for you.  I will call you if your labs return with any concerns.     ED Prescriptions    Medication Sig  Dispense Auth. Provider   meclizine (ANTIVERT) 12.5 MG tablet Take 1 tablet (12.5 mg total) by mouth 3 (three) times daily as needed for dizziness. 30 tablet Zigmund Gottron, NP     Controlled Substance Prescriptions [place] Controlled Substance Registry consulted? Not Applicable   Zigmund Gottron, NP 07/30/19 1537

## 2019-07-30 NOTE — ED Triage Notes (Signed)
Pt sts HA and sinus pressure x several days

## 2019-07-30 NOTE — Discharge Instructions (Signed)
You may try Meclizine (antivert) this afternoon in addition to medications given here today.  Continue with daily flonase, use of neti pot as needed.  If no improvement of your symptoms or any worsening of symptoms please go to the Er as you will likely need further evaluation as these are new symptoms for you.  I will call you if your labs return with any concerns.

## 2019-07-31 ENCOUNTER — Telehealth (HOSPITAL_COMMUNITY): Payer: Self-pay | Admitting: Emergency Medicine

## 2019-07-31 NOTE — Telephone Encounter (Signed)
No significant abnormalities. Attempted to reach patient. No answer.

## 2019-09-04 ENCOUNTER — Other Ambulatory Visit: Payer: Self-pay | Admitting: Obstetrics and Gynecology

## 2019-09-04 NOTE — Addendum Note (Signed)
Addended by: Aletha Halim on: 09/04/2019 03:07 PM   Modules accepted: Orders, SmartSet

## 2019-09-16 ENCOUNTER — Encounter (HOSPITAL_COMMUNITY): Admission: RE | Payer: Self-pay | Source: Home / Self Care

## 2019-09-16 ENCOUNTER — Ambulatory Visit (HOSPITAL_COMMUNITY)
Admission: RE | Admit: 2019-09-16 | Payer: Medicaid Other | Source: Home / Self Care | Admitting: Obstetrics and Gynecology

## 2019-09-16 SURGERY — EXAM UNDER ANESTHESIA
Anesthesia: Choice

## 2019-10-06 ENCOUNTER — Other Ambulatory Visit: Payer: Self-pay | Admitting: Family Medicine

## 2019-10-13 ENCOUNTER — Ambulatory Visit (HOSPITAL_COMMUNITY)
Admission: EM | Admit: 2019-10-13 | Discharge: 2019-10-13 | Disposition: A | Discharge status: Home / Self Care | Payer: Medicaid Other

## 2019-10-13 ENCOUNTER — Other Ambulatory Visit: Payer: Self-pay

## 2019-10-13 ENCOUNTER — Encounter (HOSPITAL_COMMUNITY): Payer: Self-pay | Admitting: Emergency Medicine

## 2019-10-13 ENCOUNTER — Encounter (HOSPITAL_COMMUNITY): Payer: Self-pay

## 2019-10-13 ENCOUNTER — Emergency Department (HOSPITAL_COMMUNITY): Payer: Medicaid Other

## 2019-10-13 ENCOUNTER — Emergency Department (HOSPITAL_COMMUNITY)
Admission: EM | Admit: 2019-10-13 | Discharge: 2019-10-13 | Disposition: A | Discharge status: Home / Self Care | Payer: Medicaid Other | Attending: Emergency Medicine | Admitting: Emergency Medicine

## 2019-10-13 DIAGNOSIS — R0602 Shortness of breath: Secondary | ICD-10-CM | POA: Diagnosis present

## 2019-10-13 DIAGNOSIS — J4521 Mild intermittent asthma with (acute) exacerbation: Secondary | ICD-10-CM

## 2019-10-13 DIAGNOSIS — I10 Essential (primary) hypertension: Secondary | ICD-10-CM | POA: Insufficient documentation

## 2019-10-13 DIAGNOSIS — J45901 Unspecified asthma with (acute) exacerbation: Secondary | ICD-10-CM | POA: Diagnosis not present

## 2019-10-13 DIAGNOSIS — F17210 Nicotine dependence, cigarettes, uncomplicated: Secondary | ICD-10-CM | POA: Insufficient documentation

## 2019-10-13 DIAGNOSIS — Z79899 Other long term (current) drug therapy: Secondary | ICD-10-CM | POA: Diagnosis not present

## 2019-10-13 MED ORDER — PREDNISONE 50 MG PO TABS: 50.0000 mg | ORAL_TABLET | Freq: Every day | ORAL | 0 refills | Status: AC

## 2019-10-13 MED ORDER — ALBUTEROL SULFATE (2.5 MG/3ML) 0.083% IN NEBU: 2.5000 mg | INHALATION_SOLUTION | Freq: Four times a day (QID) | RESPIRATORY_TRACT | 12 refills | Status: DC | PRN

## 2019-10-13 MED ORDER — ALBUTEROL SULFATE HFA 108 (90 BASE) MCG/ACT IN AERS
8.0000 | INHALATION_SPRAY | Freq: Once | RESPIRATORY_TRACT | Status: AC
  Administered 2019-10-13: 8 via RESPIRATORY_TRACT
  Filled 2019-10-13: qty 6.7

## 2019-10-13 MED ORDER — AEROCHAMBER PLUS FLO-VU LARGE MISC
1.0000 | Freq: Once | Status: DC
  Filled 2019-10-13: qty 1

## 2019-10-13 NOTE — ED Provider Notes (Signed)
[facility] EMERGENCY DEPARTMENT Provider Note   CSN: [id number] Arrival date & time: 10/13/19  1549     History   Chief Complaint Chief Complaint  Patient presents with  . Shortness of Breath  . Asthma    HPI Tracy Coleman is a 43 y.o. female.     HPI   Patient is a 43 year old female with a history of anxiety, asthma, arthritis, depression, hypertension, obesity, PTSD, who presents to the emergency department today for evaluation of shortness of breath, cough and wheezing that started a few days ago.  States symptoms feel consistent with her prior asthma exacerbations.  She does complain of some bilateral rib pain but states that it is not significant or persistent.  Denies any pain with inspiration.  Denies any fevers or other URI symptoms.  She denies any known coronavirus exposures.  She has been using her significant other's inhaler at home.  She is also been using nebulizer treatments but they are expired so they are not really improving her symptoms.  States she usually gets a flareup of asthma about once per year usually when the weather changes so this is not abnormal for her.  She was seen in urgent care prior to arrival however they were unable to administer nebulizer treatment due to policy in place due to Covid.  She decided to come here for further evaluation.  Past Medical History:  Diagnosis Date  . Anxiety   . Arthritis   . Asthma   . Depression   . Hypertension   . Obesity   . PTSD (post-traumatic stress disorder)     Patient Active Problem List   Diagnosis Date Noted  . Allergies 05/13/2019  . Atypical chest pain 02/06/2019  . Asthma 02/06/2019  . Cervical stenosis (uterine cervix) 01/29/2019  . IUD strings lost 01/29/2019  . Cervical dysplasia 01/29/2019  . MDD (major depressive disorder), severe ([place]) 01/03/2019  . Contraception management 12/23/2018  . Pain in joint of right ankle 10/18/2018  . Acquired hallux rigidus  10/18/2018  . 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 Essential hypertension 05/03/2013  . Obesity 05/03/2013  . Anxiety 05/03/2013  . Depression 05/03/2013    Past Surgical History:  Procedure Laterality Date  . ARTHRODESIS METATARSALPHALANGEAL JOINT (MTPJ) Right 04/11/2018   Procedure: ARTHRODESIS METATARSALPHALANGEAL JOINT (MTPJ);  Surgeon: Wylene Simmer, MD;  Location: [facility];  Service: Orthopedics;  Laterality: Right;  . FOOT ARTHRODESIS Right 01/23/2019   Procedure: Open treatment of right navicular nonunion;  Surgeon: Wylene Simmer, MD;  Location: [facility];  Service: Orthopedics;  Laterality: Right;  . LEEP     x2. Last one 2013     OB History    Gravida  2   Para  1   Term  1   Preterm      AB  1   Living  1     SAB  1   TAB      Ectopic      Multiple      Live Births  1            Home Medications    Prior to Admission medications   Medication Sig Start Date End Date Taking? Authorizing Provider  albuterol (PROVENTIL) (2.5 MG/3ML) 0.083% nebulizer solution Take 3 mLs (2.5 mg total) by nebulization every 6 (six) hours as needed for wheezing or shortness of breath. 10/13/19   Jozy Mcphearson S, PA-C  amLODipine (NORVASC) 5 MG tablet Take  1 tablet by mouth once daily 10/06/19   Rory Percy, DO  Capsaicin-Menthol-Methyl Sal (CAPSAICIN-METHYL SAL-MENTHOL) 0.025-1-12 % CREA Apply 1 application topically 4 (four) times daily. Patient not taking: Reported on 06/30/2019 02/06/19   [place] Bing, DO  cetirizine (ZYRTEC) 10 MG tablet Take 1 tablet (10 mg total) by mouth daily. 05/13/19   Diallo, Earna Coder, MD  chlorthalidone (HYGROTON) 25 MG tablet Take 1 tablet by mouth daily 10/06/19   Rory Percy, DO  famotidine (PEPCID) 20 MG tablet Take 20 mg by mouth daily.    [provider]  fluticasone (FLONASE) 50 MCG/ACT nasal spray Place 2 sprays into both nostrils daily. 05/13/19   Diallo, Earna Coder, MD  ketorolac (TORADOL) 10  MG tablet ketorolac 10 mg tablet  Take 1 tablet every 6 hours by oral route for 5 days.    [provider]  levonorgestrel (MIRENA) 20 MCG/24HR IUD 1 each by Intrauterine route once.     [provider]  lisinopril (ZESTRIL) 40 MG tablet Take 1 tablet by mouth daily 10/06/19   Rory Percy, DO  meclizine (ANTIVERT) 12.5 MG tablet Take 1 tablet (12.5 mg total) by mouth 3 (three) times daily as needed for dizziness. 07/30/19   Zigmund Gottron, NP  meloxicam (MOBIC) 15 MG tablet Take 1 tablet (15 mg total) by mouth daily. Patient not taking: Reported on 06/30/2019 02/06/19   [place] Bing, DO  mirtazapine (REMERON) 15 MG tablet Take 1 tablet (15 mg total) by mouth at bedtime. For mood Patient not taking: Reported on 06/30/2019 01/06/19   Connye Burkitt, NP  naproxen (NAPROSYN) 500 MG tablet Take 1 tablet (500 mg total) by mouth 2 (two) times daily as needed. Patient not taking: Reported on 06/30/2019 02/04/19   Ezequiel Essex, MD  predniSONE (DELTASONE) 50 MG tablet Take 1 tablet (50 mg total) by mouth daily for 5 days. 10/13/19 10/18/19  Handy Mcloud S, PA-C  senna (SENOKOT) 8.6 MG TABS tablet Take 2 tablets (17.2 mg total) by mouth 2 (two) times daily. Patient not taking: Reported on 01/29/2019 01/23/19   Corky Sing, PA-C  venlafaxine XR (EFFEXOR-XR) 37.5 MG 24 hr capsule Take 1 capsule (37.5 mg total) by mouth daily with breakfast. For mood Patient taking differently: Take 37.5 mg by mouth at bedtime. For mood 01/07/19   Connye Burkitt, NP  enoxaparin (LOVENOX) 40 MG/0.4ML injection Inject 0.4 mLs (40 mg total) into the skin daily. Patient not taking: Reported on 01/29/2019 01/23/19 06/13/19  Corky Sing, PA-C    Family History Family History  Problem Relation Age of Onset  . Alcohol abuse Mother   . Drug abuse Mother   . Depression Mother   . Early death Mother        pneumonia, age 25  . Hypertension Mother   . Alcohol abuse Father   . Drug abuse Father    . Diabetes Maternal Grandmother   . Hypertension Maternal Grandmother   . Diabetes Paternal Grandmother   . Hypertension Paternal Grandmother     Social History Social History   Tobacco Use  . Smoking status: Current Every Day Smoker    Packs/day: 0.50    Types: Cigarettes  . Smokeless tobacco: Never Used  Substance Use Topics  . Alcohol use: Yes    Frequency: Never    Comment: occasional 2-3 drinks/week  . 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  HENT: Negative for ear pain and sore throat.   Respiratory: Positive for cough, shortness of breath and wheezing.   Cardiovascular: Positive for chest pain (bilat lower ribs) and leg swelling (chronic, unchanged).  Gastrointestinal: Negative for abdominal pain, nausea and vomiting.  All other systems reviewed and are negative.    Physical Exam Updated Vital Signs BP (!) 171/113 (BP Location: Left Wrist)   Pulse 84   Temp 98.3 F (36.8 C) (Oral)   Resp (!) 22   SpO2 100%   Physical Exam Vitals signs and nursing note reviewed.  Constitutional:      General: She is not in acute distress.    Appearance: She is well-developed. She is not ill-appearing or toxic-appearing.  HENT:     Head: Normocephalic and atraumatic.  Eyes:     Conjunctiva/sclera: Conjunctivae normal.  Neck:     Musculoskeletal: Neck supple.  Cardiovascular:     Rate and Rhythm: Normal rate and regular rhythm.     Heart sounds: Normal heart sounds. No murmur.  Pulmonary:     Effort: Pulmonary effort is normal. No respiratory distress.     Breath sounds: Examination of the right-upper field reveals wheezing. Examination of the left-upper field reveals wheezing. Examination of the right-middle field reveals wheezing. Examination of the left-middle field reveals wheezing. Examination of the  right-lower field reveals wheezing. Examination of the left-lower field reveals wheezing. Wheezing present.     Comments: Mild tachypnea.  Speaking in full sentences/paragraphs. Abdominal:     Palpations: Abdomen is soft.     Tenderness: There is no abdominal tenderness.  Skin:    General: Skin is warm and dry.  Neurological:     Mental Status: She is alert.      ED Treatments / Results  Labs (all labs ordered are listed, but only abnormal results are displayed) Labs Reviewed - No data to display  EKG None  Radiology No results found.  Procedures Procedures (including critical care time)  Medications Ordered in ED Medications  AeroChamber Plus Flo-Vu Large MISC 1 each (has no administration in time range)  albuterol (VENTOLIN HFA) 108 (90 Base) MCG/ACT inhaler 8 puff (8 puffs Inhalation Given 10/13/19 1927)     Initial Impression / Assessment and Plan / ED Course  I have reviewed the triage vital signs and the nursing notes.  Pertinent labs & imaging results that were available during my care of the patient were reviewed by me and considered in my medical decision making (see chart for details).     Final Clinical Impressions(s) / ED Diagnoses   Final diagnoses:  Exacerbation of asthma, unspecified asthma severity, unspecified whether persistent   43 year old female presenting for evaluation of cough, wheezing, shortness of breath.  Has history of asthma and states symptoms feel similar.  Seen at urgent care prior to arrival but did not receive nebulizer treatment therefore came here for further treatment  She is slightly tachypneic but speaking full sentences.  She does have diffuse wheezing throughout.  Her sats are normal.  She is afebrile.  She is nontoxic and is generally well-appearing.  I explained to the patient that this is also the [facility] in light of recent Covid pandemic but did offer to give albuterol treatment with spacer to help with  her symptoms.  I offered Covid testing as well as chest x-ray however patient states that this  feels similar to asthma exacerbations and she would just prefer to be treated here.  Also discussed that I could give her prednisone here versus just giving her prescription and she preferred receiving prescription rather than waiting for this in the ED.  She agreed to use albuterol inhaler  Later I was informed by nursing staff that patient was upset and wanting to leave the ED.  I evaluated the patient and she stated that she did not feel any better after albuterol and now she was having pain in her chest from breathing and that the albuterol made her feel jittery.  She voiced dissatisfaction with her visit today and I attempted to provide reassurance, and asked again if she would like further work-up/treatment given that she does not feel any improvement.  She declined any further work-up and is requesting prescriptions and to be discharged.  I will give Rx for prednisone, nebulizer solution and she also go home with albuterol inhaler.  I did advise her to follow-up with her primary doctor about her symptoms and to please return to the emergency department if her symptoms worsened.  She voiced understanding and was discharged in stable condition.    ED Discharge Orders         Ordered    predniSONE (DELTASONE) 50 MG tablet  Daily     10/13/19 2016    albuterol (PROVENTIL) (2.5 MG/3ML) 0.083% nebulizer solution  Every 6 hours PRN     10/13/19 2016           Rodney Booze, PA-C 10/13/19 2116    Drenda Freeze, MD 10/14/19 1331

## 2019-10-13 NOTE — Discharge Instructions (Addendum)
Please follow up with your primary care provider within 5-7 days for re-evaluation of your symptoms. If you do not have a primary care provider, information for a healthcare clinic has been provided for you to make arrangements for follow up care. Please return to the emergency department for any new or worsening symptoms. ° °

## 2019-10-13 NOTE — ED Provider Notes (Signed)
[facility]    CSN: [id number] Arrival date & time: 10/13/19  1505      History   Chief Complaint Chief Complaint  Patient presents with  . Shortness of Breath    HPI Tracy Coleman is a 43 y.o. female.   Patient presents with 2-day history of shortness of breath, nonproductive cough, and wheezing.  She has been using her albuterol nebulizer but states the medicine expired in 2019 and she does not think it is helping.  She also took prednisone this morning.  She denies fever, chills, sore throat, rash, vomiting, diarrhea, or other symptoms.    The history is provided by the patient.    Past Medical History:  Diagnosis Date  . Anxiety   . Arthritis   . Asthma   . Depression   . Hypertension   . Obesity   . PTSD (post-traumatic stress disorder)     Patient Active Problem List   Diagnosis Date Noted  . Allergies 05/13/2019  . Atypical chest pain 02/06/2019  . Asthma 02/06/2019  . Cervical stenosis (uterine cervix) 01/29/2019  . IUD strings lost 01/29/2019  . Cervical dysplasia 01/29/2019  . MDD (major depressive disorder), severe ([place]) 01/03/2019  . Contraception management 12/23/2018  . Pain in joint of right ankle 10/18/2018  . Acquired hallux rigidus 10/18/2018  . 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 Essential hypertension 05/03/2013  . Obesity 05/03/2013  . Anxiety 05/03/2013  . Depression 05/03/2013    Past Surgical History:  Procedure Laterality Date  . ARTHRODESIS METATARSALPHALANGEAL JOINT (MTPJ) Right 04/11/2018   Procedure: ARTHRODESIS METATARSALPHALANGEAL JOINT (MTPJ);  Surgeon: Wylene Simmer, MD;  Location: [facility];  Service: Orthopedics;  Laterality: Right;  . FOOT ARTHRODESIS Right 01/23/2019   Procedure: Open treatment of right navicular nonunion;  Surgeon: Wylene Simmer, MD;  Location: [facility];  Service: Orthopedics;  Laterality: Right;  . LEEP     x2. Last one 2013    OB History    Gravida  2   Para   1   Term  1   Preterm      AB  1   Living  1     SAB  1   TAB      Ectopic      Multiple      Live Births  1            Home Medications    Prior to Admission medications   Medication Sig Start Date End Date Taking? Authorizing Provider  amLODipine (NORVASC) 5 MG tablet Take 1 tablet by mouth once daily 10/06/19  Yes Rumball, Bryson Ha, DO  chlorthalidone (HYGROTON) 25 MG tablet Take 1 tablet by mouth daily 10/06/19  Yes Rumball, Bryson Ha, DO  lisinopril (ZESTRIL) 40 MG tablet Take 1 tablet by mouth daily 10/06/19  Yes Rumball, Bryson Ha, DO  venlafaxine XR (EFFEXOR-XR) 37.5 MG 24 hr capsule Take 1 capsule (37.5 mg total) by mouth daily with breakfast. For mood Patient taking differently: Take 37.5 mg by mouth at bedtime. For mood 01/07/19  Yes Connye Burkitt, NP  albuterol (PROVENTIL HFA;VENTOLIN HFA) 108 (90 Base) MCG/ACT inhaler Inhale 2 puffs into the lungs every 4 (four) hours as needed for wheezing or shortness of breath. 02/06/19   [place] Bing, DO  Capsaicin-Menthol-Methyl Sal (CAPSAICIN-METHYL SAL-MENTHOL) 0.025-1-12 % CREA Apply 1 application topically 4 (four) times daily. Patient not taking: Reported on 06/30/2019 02/06/19   [place] Bing, DO  cetirizine (ZYRTEC) 10  MG tablet Take 1 tablet (10 mg total) by mouth daily. 05/13/19   Diallo, Earna Coder, MD  famotidine (PEPCID) 20 MG tablet Take 20 mg by mouth daily.    [provider]  fluticasone (FLONASE) 50 MCG/ACT nasal spray Place 2 sprays into both nostrils daily. 05/13/19   Diallo, Earna Coder, MD  ketorolac (TORADOL) 10 MG tablet ketorolac 10 mg tablet  Take 1 tablet every 6 hours by oral route for 5 days.    [provider]  levonorgestrel (MIRENA) 20 MCG/24HR IUD 1 each by Intrauterine route once.     [provider]  meclizine (ANTIVERT) 12.5 MG tablet Take 1 tablet (12.5 mg total) by mouth 3 (three) times daily as needed for dizziness. 07/30/19   Zigmund Gottron, NP  meloxicam (MOBIC)  15 MG tablet Take 1 tablet (15 mg total) by mouth daily. Patient not taking: Reported on 06/30/2019 02/06/19   [place] Bing, DO  mirtazapine (REMERON) 15 MG tablet Take 1 tablet (15 mg total) by mouth at bedtime. For mood Patient not taking: Reported on 06/30/2019 01/06/19   Connye Burkitt, NP  naproxen (NAPROSYN) 500 MG tablet Take 1 tablet (500 mg total) by mouth 2 (two) times daily as needed. Patient not taking: Reported on 06/30/2019 02/04/19   Ezequiel Essex, MD  senna (SENOKOT) 8.6 MG TABS tablet Take 2 tablets (17.2 mg total) by mouth 2 (two) times daily. Patient not taking: Reported on 01/29/2019 01/23/19   Corky Sing, PA-C  enoxaparin (LOVENOX) 40 MG/0.4ML injection Inject 0.4 mLs (40 mg total) into the skin daily. Patient not taking: Reported on 01/29/2019 01/23/19 06/13/19  Corky Sing, PA-C    Family History Family History  Problem Relation Age of Onset  . Alcohol abuse Mother   . Drug abuse Mother   . Depression Mother   . Early death Mother        pneumonia, age 40  . Hypertension Mother   . Alcohol abuse Father   . Drug abuse Father   . Diabetes Maternal Grandmother   . Hypertension Maternal Grandmother   . Diabetes Paternal Grandmother   . Hypertension Paternal Grandmother     Social History Social History   Tobacco Use  . Smoking status: Current Every Day Smoker    Packs/day: 0.50    Types: Cigarettes  . Smokeless tobacco: Never Used  Substance Use Topics  . Alcohol use: Yes    Frequency: Never    Comment: occasional 2-3 drinks/week  . 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  HENT: Negative for ear pain and sore throat.   Eyes: Negative for pain and visual disturbance.  Respiratory: Positive for cough, shortness of breath and wheezing.   Cardiovascular: Negative for chest  pain and palpitations.  Gastrointestinal: Negative for abdominal pain and vomiting.  Genitourinary: Negative for dysuria and hematuria.  Musculoskeletal: Negative for arthralgias and back pain.  Skin: Negative for color change and rash.  Neurological: Negative for seizures and syncope.  All other systems reviewed and are negative.    Physical Exam Triage Vital Signs ED Triage Vitals  Enc Vitals Group     BP      Pulse      Resp      Temp      Temp  src      SpO2      Weight      Height      Head Circumference      Peak Flow      Pain Score      Pain Loc      Pain Edu?      Excl. in [place]?    No data found.  Updated Vital Signs BP (!) 153/86 (BP Location: Left Arm)   Pulse 90   Temp 99.1 F (37.3 C) (Oral)   Resp 16   SpO2 100%   Visual Acuity Right Eye Distance:   Left Eye Distance:   Bilateral Distance:    Right Eye Near:   Left Eye Near:    Bilateral Near:     Physical Exam Vitals signs and nursing note reviewed.  Constitutional:      General: She is not in acute distress.    Appearance: She is well-developed.  HENT:     Head: Normocephalic and atraumatic.  Eyes:     Conjunctiva/sclera: Conjunctivae normal.  Neck:     Musculoskeletal: Neck supple.  Cardiovascular:     Rate and Rhythm: Normal rate and regular rhythm.     Heart sounds: No murmur.  Pulmonary:     Effort: Pulmonary effort is normal. No respiratory distress.     Breath sounds: Wheezing present.  Abdominal:     Palpations: Abdomen is soft.     Tenderness: There is no abdominal tenderness. There is no guarding or rebound.  Skin:    General: Skin is warm and dry.  Neurological:     Mental Status: She is alert.      UC Treatments / Results  Labs (all labs ordered are listed, but only abnormal results are displayed) Labs Reviewed - No data to display  EKG   Radiology No results found.  Procedures Procedures (including critical care time)  Medications Ordered in UC  Medications - No data to display  Initial Impression / Assessment and Plan / UC Course  I have reviewed the triage vital signs and the nursing notes.  Pertinent labs & imaging results that were available during my care of the patient were reviewed by me and considered in my medical decision making (see chart for details).   Asthma exacerbation.  Patient expressed dissatisfaction that we could not give her an albuterol nebulizer treatment here.  She left, stating she was going to the emergency department.     Final Clinical Impressions(s) / UC Diagnoses   Final diagnoses:  Exacerbation of intermittent asthma, unspecified asthma severity   Discharge Instructions   None    ED Prescriptions    None     PDMP not reviewed this encounter.   Sharion Balloon, NP 10/13/19 1553

## 2019-10-13 NOTE — ED Triage Notes (Signed)
Pt. Stated, They only gave me an inhaler and I have that I needed a breathing treatment  This always happens when the weather changes.

## 2019-10-13 NOTE — ED Notes (Signed)
Patient Alert and oriented to baseline. Stable and ambulatory to baseline. Patient verbalized understanding of the discharge instructions.  Patient belongings were taken by the patient.   

## 2019-10-13 NOTE — ED Triage Notes (Signed)
Pt. Stated, my asthma is giving my SOB since SAt. Ive used all my treatments and took Prednison 30 mg. Today. Went to UC and waitted 45 min and they said they couldn't treat me. Sent me here.

## 2019-10-13 NOTE — ED Notes (Signed)
Pt not waiting for discharge vitals.

## 2019-10-13 NOTE — ED Notes (Addendum)
Pt left abruptly against medical advice after being told by the provider that this facility does not do breathing treatments because of the safety risk of aerosolizing procedures during the [place] pandemic. Patient yelling and cursing at provider on her way out of the facility, also yelling at registration staff that they "should have told her we did not do breathing treatments here, ya'll wasted my time!". Patient checking in at the ED for the "treatment she needs".

## 2019-10-13 NOTE — ED Triage Notes (Signed)
Patient presents to Urgent Care with complaints of shortness of breath since 2 days ago upon waking. Patient reports she has been doubling up on her normal breathing treatments including prednisone, nebulizer machine, and her husband's inhaler. Pt states it has been harder with the recent weather change.

## 2019-10-30 IMAGING — US US PELVIS COMPLETE WITH TRANSVAGINAL
2 series · 15 of 25 positions shown · non-contrast
Comparison: None

CLINICAL DATA: Cervical stenosis, lost IUD

EXAM:
TRANSABDOMINAL AND TRANSVAGINAL ULTRASOUND OF PELVIS
TECHNIQUE: Both transabdominal and transvaginal ultrasound examinations of the
pelvis were performed. Transabdominal technique was performed for
global imaging of the pelvis including uterus, ovaries, adnexal
regions, and pelvic cul-de-sac. It was necessary to proceed with
endovaginal exam following the transabdominal exam to visualize the
uterus, endometrial complex, IUD, and ovaries.

[Series 1: us pelvis complete with transvaginal · 7 of 33 slices shown (1 of 2)]
[im 1/33]
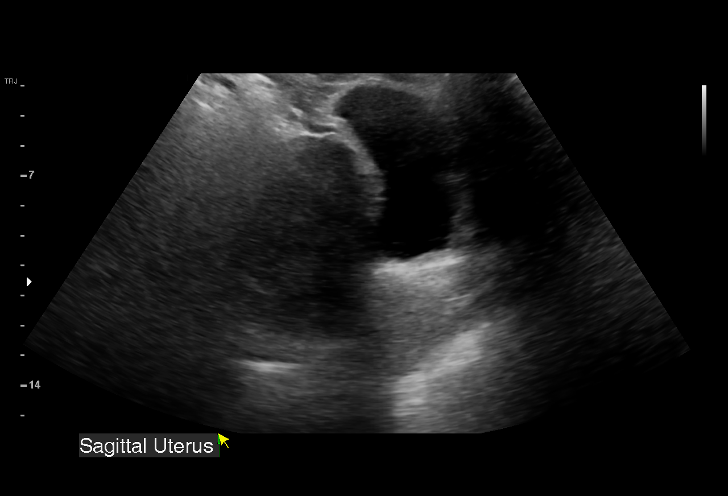
[im 7/33]
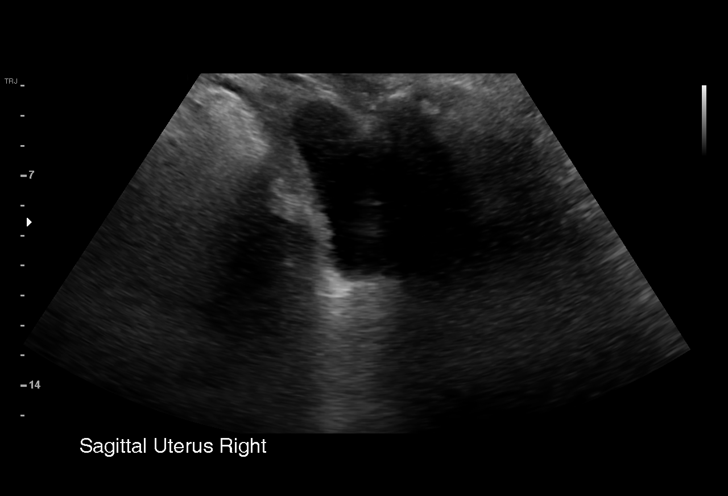
[im 13/33]
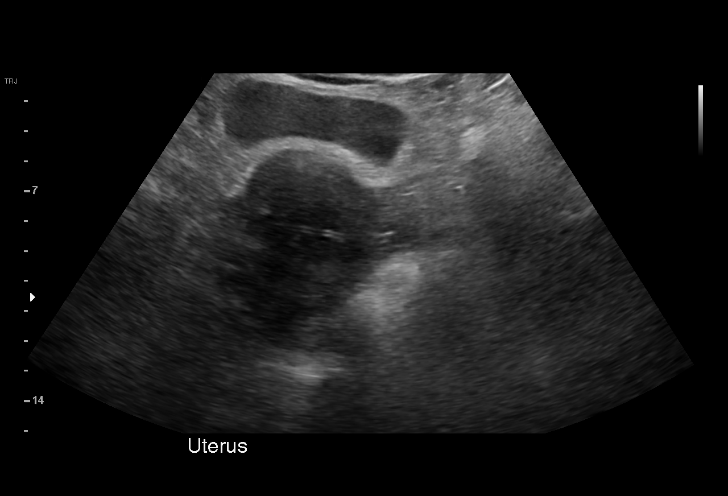
[im 17/33]
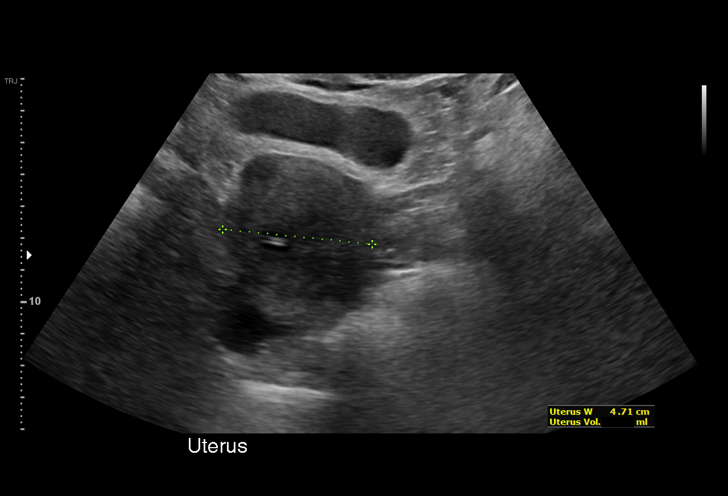
[im 23/33]
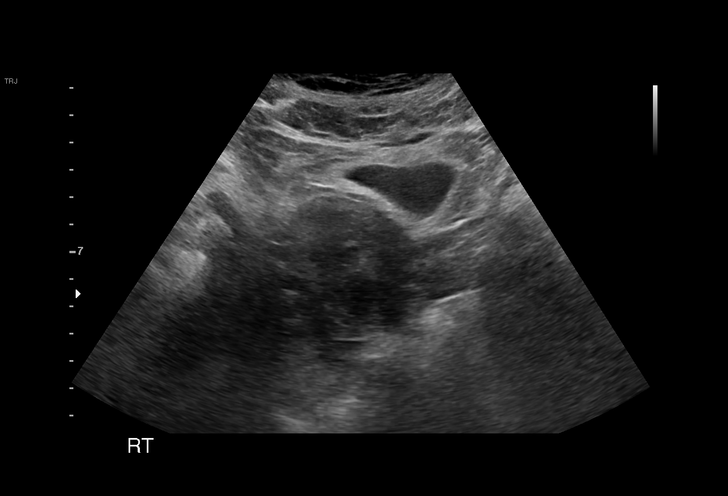
[im 29/33]
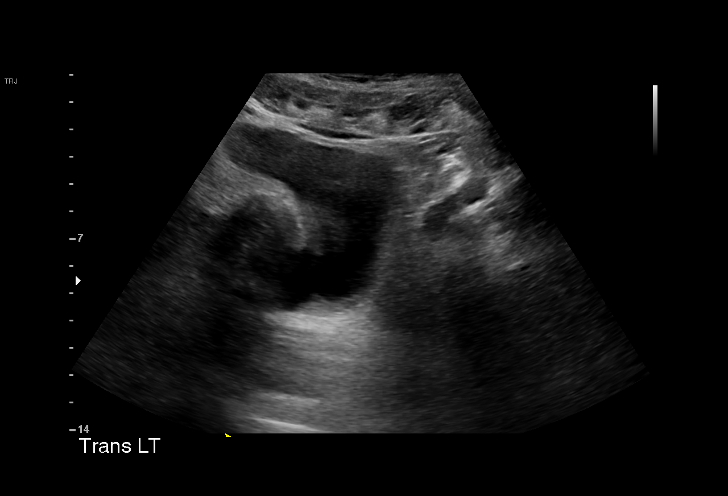
[im 33/33]
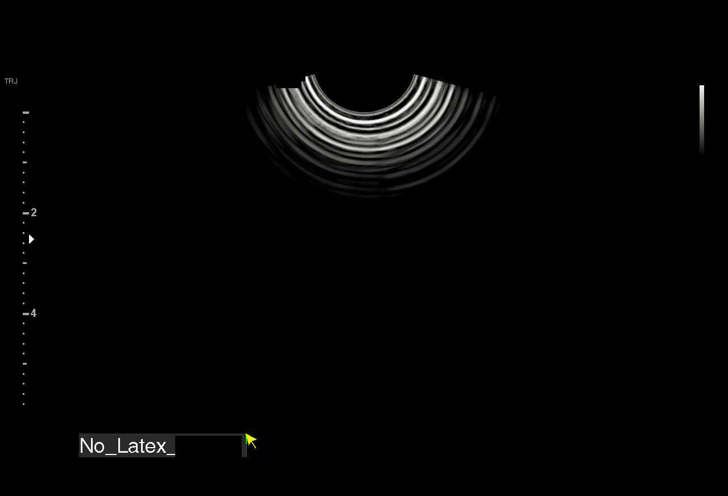

[Series 2: us pelvis complete with transvaginal · 8 of 40 slices shown (2 of 2)]
[im 4/40]
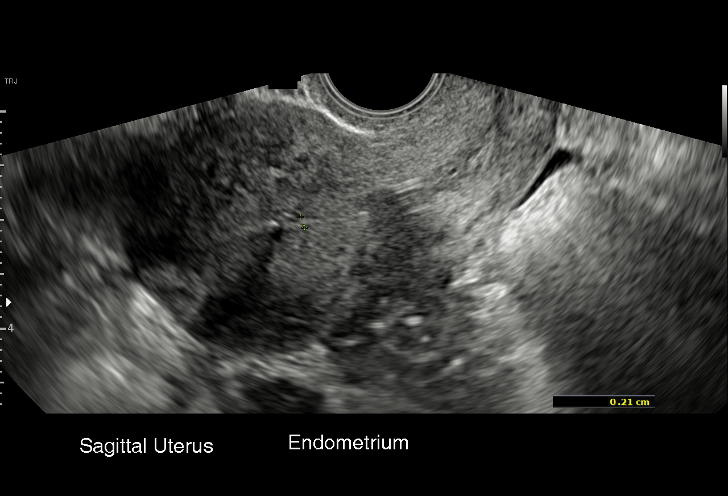
[im 10/40]
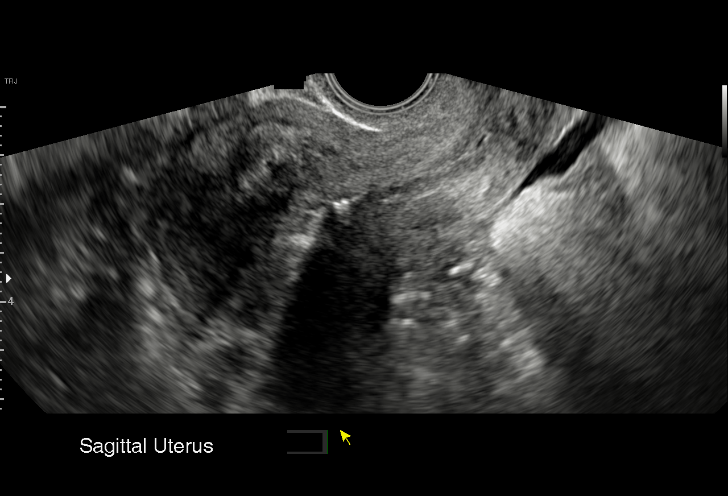
[im 13/40]
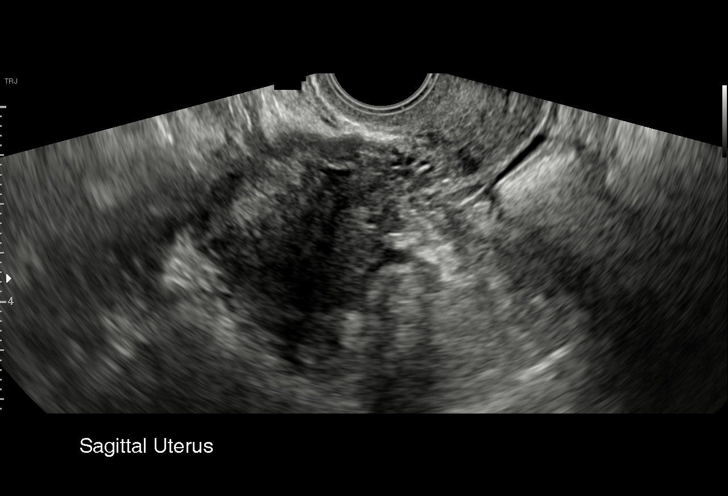
[im 19/40]
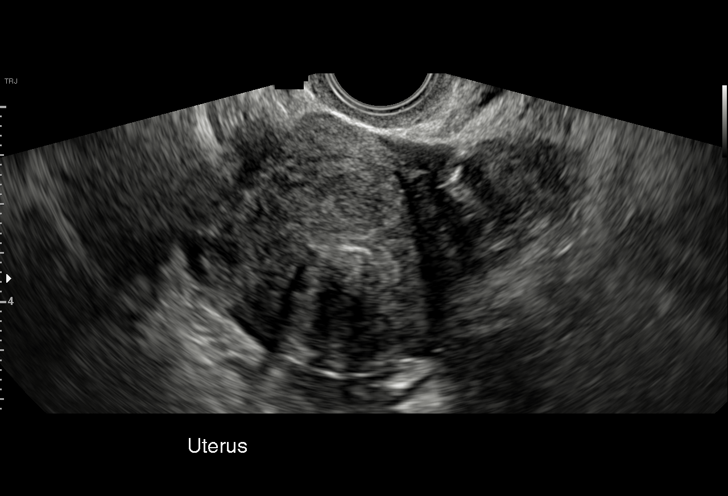
[im 25/40]
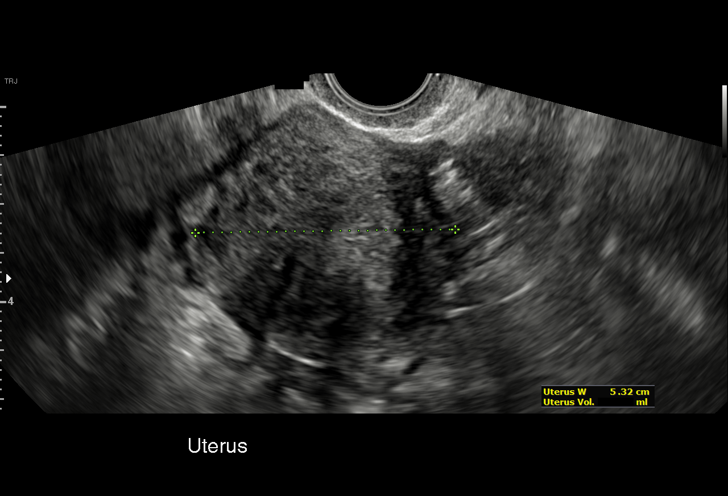
[im 28/40]
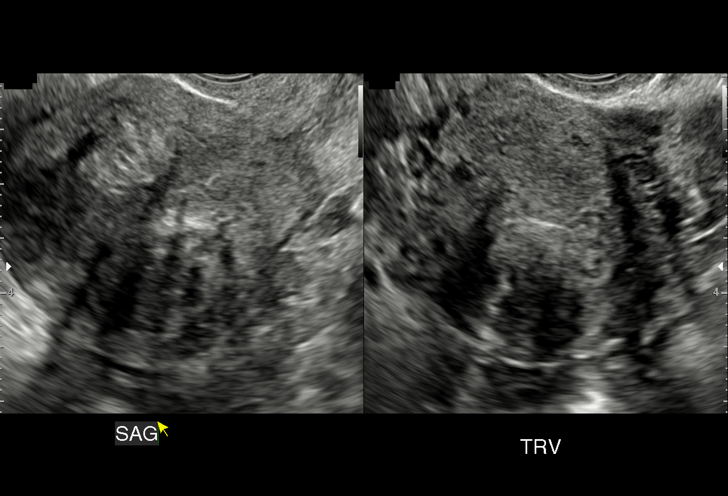
[im 34/40]
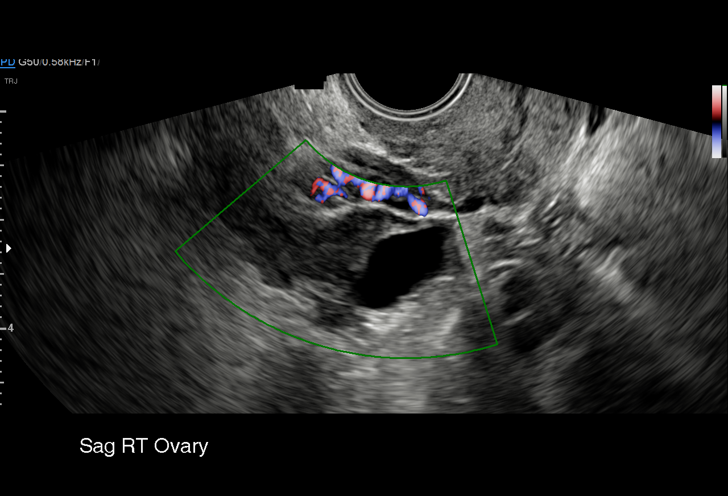
[im 40/40]
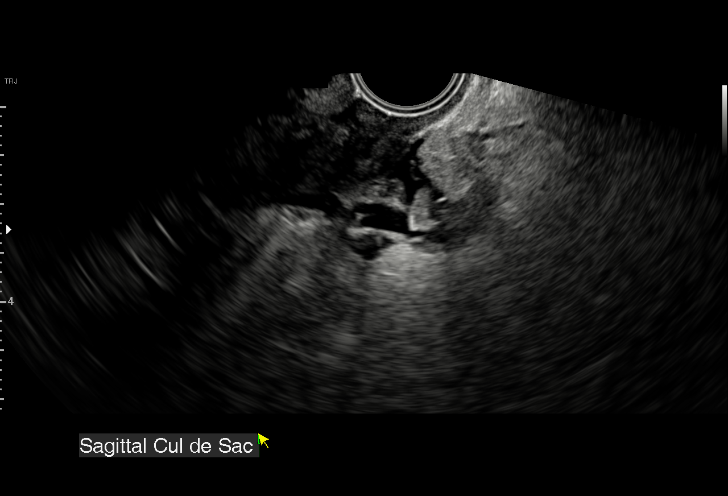

[15 of 25 positions shown; findings below may reference images not displayed]

FINDINGS: Uterus

Measurements: 8.5 x 5.6 x 5.3 cm = volume: 133 mL. Anteverted.
Normal morphology. Two uterine leiomyomata are identified, a
submucosal posterior uterine mass 2.7 x 2.1 x 2.1 cm and an
intramural anterior upper uterine mass 2.0 x 1.7 x 2.1 cm.

Endometrium

Thickness: 2 mm. IUD present within the endometrial canal at the mid
uterus, does not extend to the fundal portion of the endometrial
canal.

Right ovary

Measurements: 3.4 x 1.9 x 2.3 cm = volume: 7.9 mL. Normal morphology
without mass

Left ovary

Measurements: 2.8 x 1.3 x 2.3 cm = volume: 4.6 mL. Normal morphology
without mass

Other findings

Trace free pelvic fluid.  No adnexal masses.
IMPRESSION: 2 uterine leiomyomata, 1 of which extend submucosal.

IUD located at the mid uterine endometrial canal.

## 2020-05-26 ENCOUNTER — Other Ambulatory Visit: Payer: Self-pay | Admitting: *Deleted

## 2020-05-26 DIAGNOSIS — T7840XA Allergy, unspecified, initial encounter: Secondary | ICD-10-CM

## 2020-05-26 MED ORDER — CETIRIZINE HCL 10 MG PO TABS: 10.0000 mg | ORAL_TABLET | Freq: Every day | ORAL | 11 refills | Status: DC

## 2020-07-11 DIAGNOSIS — U071 COVID-19: Secondary | ICD-10-CM

## 2020-07-11 HISTORY — DX: COVID-19: U07.1

## 2020-08-07 ENCOUNTER — Other Ambulatory Visit: Payer: Self-pay

## 2020-08-07 ENCOUNTER — Ambulatory Visit
Admission: EM | Admit: 2020-08-07 | Discharge: 2020-08-07 | Disposition: A | Discharge status: Home / Self Care | Payer: Medicaid Other | Attending: Emergency Medicine | Admitting: Emergency Medicine

## 2020-08-07 ENCOUNTER — Encounter: Payer: Self-pay | Admitting: Emergency Medicine

## 2020-08-07 DIAGNOSIS — Z1152 Encounter for screening for COVID-19: Secondary | ICD-10-CM

## 2020-08-07 DIAGNOSIS — J069 Acute upper respiratory infection, unspecified: Secondary | ICD-10-CM | POA: Diagnosis not present

## 2020-08-07 MED ORDER — PREDNISONE 50 MG PO TABS: 50.0000 mg | ORAL_TABLET | Freq: Every day | ORAL | 0 refills | Status: DC

## 2020-08-07 MED ORDER — BENZONATATE 100 MG PO CAPS: 100.0000 mg | ORAL_CAPSULE | Freq: Three times a day (TID) | ORAL | 0 refills | Status: DC

## 2020-08-07 MED ORDER — FLUTICASONE PROPIONATE 50 MCG/ACT NA SUSP: 1.0000 | Freq: Every day | NASAL | 0 refills | Status: DC

## 2020-08-07 MED ORDER — CETIRIZINE HCL 10 MG PO TABS: 10.0000 mg | ORAL_TABLET | Freq: Every day | ORAL | 0 refills | Status: DC

## 2020-08-07 MED ORDER — ALBUTEROL SULFATE HFA 108 (90 BASE) MCG/ACT IN AERS: 2.0000 | INHALATION_SPRAY | RESPIRATORY_TRACT | 0 refills | Status: DC | PRN

## 2020-08-07 NOTE — ED Triage Notes (Signed)
Pt here for cough and possible covid exposure

## 2020-08-07 NOTE — ED Provider Notes (Signed)
[facility]    CSN: [id number] Arrival date & time: 08/07/20  1340      History   Chief Complaint Chief Complaint  Patient presents with  . Cough    HPI Tracy Coleman is a 44 y.o. female  Presenting for Covid testing: Exposure: family member Date of exposure: 1 wk ago Any fever, symptoms since exposure: Endorsing cough, increased dyspnea which is alleviated by albuterol inhaler.  Requesting refill of this.  No fever, chest pain, vomiting.   Past Medical History:  Diagnosis Date  . Anxiety   . Arthritis   . Asthma   . Depression   . Hypertension   . Obesity   . PTSD (post-traumatic stress disorder)     Patient Active Problem List   Diagnosis Date Noted  . Allergies 05/13/2019  . Atypical chest pain 02/06/2019  . Asthma 02/06/2019  . Cervical stenosis (uterine cervix) 01/29/2019  . IUD strings lost 01/29/2019  . Cervical dysplasia 01/29/2019  . MDD (major depressive disorder), severe ([place]) 01/03/2019  . Contraception management 12/23/2018  . Pain in joint of right ankle 10/18/2018  . Acquired hallux rigidus 10/18/2018  . 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 Essential hypertension 05/03/2013  . Obesity 05/03/2013  . Anxiety 05/03/2013  . Depression 05/03/2013    Past Surgical History:  Procedure Laterality Date  . ARTHRODESIS METATARSALPHALANGEAL JOINT (MTPJ) Right 04/11/2018   Procedure: ARTHRODESIS METATARSALPHALANGEAL JOINT (MTPJ);  Surgeon: Wylene Simmer, MD;  Location: [place];  Service: Orthopedics;  Laterality: Right;  . FOOT ARTHRODESIS Right 01/23/2019   Procedure: Open treatment of right navicular nonunion;  Surgeon: Wylene Simmer, MD;  Location: [place];  Service: Orthopedics;  Laterality: Right;  . LEEP     x2. Last one 2013    OB History    Gravida  2   Para  1   Term  1   Preterm      AB  1   Living  1     SAB  1   TAB      Ectopic      Multiple      Live Births  1             Home Medications    Prior to Admission medications   Medication Sig Start Date End Date Taking? Authorizing Provider  albuterol (PROVENTIL) (2.5 MG/3ML) 0.083% nebulizer solution Take 3 mLs (2.5 mg total) by nebulization every 6 (six) hours as needed for wheezing or shortness of breath. 10/13/19   Couture, Cortni S, PA-C  albuterol (VENTOLIN HFA) 108 (90 Base) MCG/ACT inhaler Inhale 2 puffs into the lungs every 4 (four) hours as needed for wheezing or shortness of breath. 08/07/20   Hall-Potvin, [place], PA-C  amLODipine (NORVASC) 5 MG tablet Take 1 tablet by mouth once daily 10/06/19   Rory Percy, DO  benzonatate (TESSALON) 100 MG capsule Take 1 capsule (100 mg total) by mouth every 8 (eight) hours. 08/07/20   Hall-Potvin, [place], PA-C  Capsaicin-Menthol-Methyl Sal (CAPSAICIN-METHYL SAL-MENTHOL) 0.025-1-12 % CREA Apply 1 application topically 4 (four) times daily. Patient not taking: Reported on 06/30/2019 02/06/19   Harriet Butte, DO  cetirizine (ZYRTEC ALLERGY) 10 MG tablet Take 1 tablet (10 mg total) by mouth daily. 08/07/20   Hall-Potvin, [place], PA-C  chlorthalidone (HYGROTON) 25 MG tablet Take 1 tablet by mouth daily 10/06/19   Rory Percy, DO  famotidine (PEPCID) 20 MG tablet Take 20 mg by mouth daily.    [provider]  fluticasone (FLONASE) 50 MCG/ACT nasal spray Place 1 spray into both nostrils daily. 08/07/20   Hall-Potvin, [place], PA-C  ketorolac (TORADOL) 10 MG tablet ketorolac 10 mg tablet  Take 1 tablet every 6 hours by oral route for 5 days.    [provider]  levonorgestrel (MIRENA) 20 MCG/24HR IUD 1 each by Intrauterine route once.     [provider]  lisinopril (ZESTRIL) 40 MG tablet Take 1 tablet by mouth daily 10/06/19   Rory Percy, DO  meclizine (ANTIVERT) 12.5 MG tablet Take 1 tablet (12.5 mg total) by mouth 3 (three) times daily as needed for dizziness. 07/30/19   Zigmund Gottron, NP  meloxicam (MOBIC) 15 MG tablet Take 1  tablet (15 mg total) by mouth daily. Patient not taking: Reported on 06/30/2019 02/06/19   Harriet Butte, DO  mirtazapine (REMERON) 15 MG tablet Take 1 tablet (15 mg total) by mouth at bedtime. For mood Patient not taking: Reported on 06/30/2019 01/06/19   Connye Burkitt, NP  naproxen (NAPROSYN) 500 MG tablet Take 1 tablet (500 mg total) by mouth 2 (two) times daily as needed. Patient not taking: Reported on 06/30/2019 02/04/19   Ezequiel Essex, MD  predniSONE (DELTASONE) 50 MG tablet Take 1 tablet (50 mg total) by mouth daily with breakfast. 08/07/20   Hall-Potvin, [place], PA-C  senna (SENOKOT) 8.6 MG TABS tablet Take 2 tablets (17.2 mg total) by mouth 2 (two) times daily. Patient not taking: Reported on 01/29/2019 01/23/19   Corky Sing, PA-C  venlafaxine XR (EFFEXOR-XR) 37.5 MG 24 hr capsule Take 1 capsule (37.5 mg total) by mouth daily with breakfast. For mood Patient taking differently: Take 37.5 mg by mouth at bedtime. For mood 01/07/19   Connye Burkitt, NP  enoxaparin (LOVENOX) 40 MG/0.4ML injection Inject 0.4 mLs (40 mg total) into the skin daily. Patient not taking: Reported on 01/29/2019 01/23/19 06/13/19  Corky Sing, PA-C    Family History Family History  Problem Relation Age of Onset  . Alcohol abuse Mother   . Drug abuse Mother   . Depression Mother   . Early death Mother        pneumonia, age 33  . Hypertension Mother   . Alcohol abuse Father   . Drug abuse Father   . Diabetes Maternal Grandmother   . Hypertension Maternal Grandmother   . Diabetes Paternal Grandmother   . Hypertension Paternal Grandmother     Social History Social History   Tobacco Use  . Smoking status: Current Every Day Smoker    Packs/day: 0.50    Types: Cigarettes  . Smokeless tobacco: Never Used  Vaping Use  . Vaping Use: Never used  Substance Use Topics  . Alcohol use: Yes    Comment: occasional 2-3 drinks/week  . 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 --      Excl. in [place]? --    No data found.  Updated Vital Signs BP (!) 144/85 (BP Location: Right Arm)   Pulse 87   Temp 99.8 F (37.7 C) (Oral)   Resp 18   SpO2 99%   Visual Acuity Right Eye Distance:   Left Eye Distance:   Bilateral Distance:    Right Eye Near:   Left Eye Near:    Bilateral Near:     Physical Exam Constitutional:      General: She is not in acute distress.    Appearance: She is obese. She is not ill-appearing or diaphoretic.  HENT:     Head: Normocephalic and atraumatic.     Mouth/Throat:     Mouth: Mucous membranes are moist.     Pharynx: Oropharynx is clear. No oropharyngeal exudate or posterior oropharyngeal erythema.  Eyes:     General: No scleral icterus.    Conjunctiva/sclera: Conjunctivae normal.     Pupils: Pupils are equal, round, and reactive to light.  Neck:     Comments: Trachea midline, negative JVD Cardiovascular:     Rate and Rhythm: Normal rate and regular rhythm.     Heart sounds: No murmur heard.  No gallop.   Pulmonary:     Effort: Pulmonary effort is normal. No respiratory distress.     Breath sounds: Wheezing present. No rhonchi or rales.     Comments: Trace, diffuse.  No prolonged expiration Musculoskeletal:     Cervical back: Neck supple. No tenderness.  Lymphadenopathy:     Cervical: No cervical adenopathy.  Skin:    Capillary Refill: Capillary refill takes less than 2 seconds.     Coloration: Skin is not  jaundiced or pale.     Findings: No rash.  Neurological:     General: No focal deficit present.     Mental Status: She is alert and oriented to person, place, and time.      UC Treatments / Results  Labs (all labs ordered are listed, but only abnormal results are displayed) Labs Reviewed  NOVEL CORONAVIRUS, NAA    EKG   Radiology No results found.  Procedures Procedures (including critical care time)  Medications Ordered in UC Medications - No data to display  Initial Impression / Assessment and Plan / UC Course  I have reviewed the triage vital signs and the nursing notes.  Pertinent labs & imaging results that were available during my care of the patient were reviewed by me and considered in my medical decision making (see chart for details).     Patient afebrile, nontoxic, with SpO2 99%.  Covid PCR pending.  Patient to quarantine until results are back.  We will treat supportively as outlined below.  Return precautions discussed, patient verbalized understanding and is agreeable to plan. Final Clinical Impressions(s) / UC Diagnoses   Final diagnoses:  Encounter for screening for COVID-19  URI with cough and congestion     Discharge Instructions     Tessalon for cough. Start flonase, atrovent nasal spray for nasal congestion/drainage. You can use over the counter nasal saline rinse such as neti pot for nasal congestion. Keep hydrated, your urine should be clear to pale yellow in color. Tylenol/motrin for fever and pain. Monitor for any worsening of  symptoms, chest pain, shortness of breath, wheezing, swelling of the throat, go to the emergency department for further evaluation needed.     ED Prescriptions    Medication Sig Dispense Auth. Provider   predniSONE (DELTASONE) 50 MG tablet Take 1 tablet (50 mg total) by mouth daily with breakfast. 3 tablet Hall-Potvin, [place], PA-C   benzonatate (TESSALON) 100 MG capsule Take 1 capsule (100 mg total) by mouth every  8 (eight) hours. 21 capsule Hall-Potvin, [place], PA-C   cetirizine (ZYRTEC ALLERGY) 10 MG tablet Take 1 tablet (10 mg total) by mouth daily. 30 tablet Hall-Potvin, [place], PA-C   fluticasone (FLONASE) 50 MCG/ACT nasal spray Place 1 spray into both nostrils daily. 16 g Hall-Potvin, [place], PA-C   albuterol (VENTOLIN HFA) 108 (90 Base) MCG/ACT inhaler Inhale 2 puffs into the lungs every 4 (four) hours as needed for wheezing or shortness of breath. 18 g Hall-Potvin, [place], PA-C     PDMP not reviewed this encounter.   Hall-Potvin, [place], [place] 08/07/20 1532

## 2020-08-07 NOTE — Discharge Instructions (Signed)

## 2020-08-09 LAB — NOVEL CORONAVIRUS, NAA: SARS-CoV-2, NAA: DETECTED — AB

## 2020-08-09 LAB — SARS-COV-2, NAA 2 DAY TAT

## 2020-08-10 ENCOUNTER — Telehealth: Payer: Self-pay | Admitting: Family

## 2020-08-10 NOTE — Telephone Encounter (Signed)
Called to Discuss with patient about Covid symptoms and the use of the monoclonal antibody infusion for those with mild to moderate Covid symptoms and at a high risk of hospitalization.     Pt appears to qualify for this infusion due to co-morbid conditions and/or a member of an at-risk group in accordance with the FDA Emergency Use Authorization.   Ms. Besse was seen at Urgent Care on 08/07/20 with cough and increased dyspnea. COVID testing positive. Risk factors include asthma, hypertension and obesity (BMI >25).   Unable to reach pt - left voicemail. Message sent to [place].   Terri Piedra, NP

## 2020-10-15 ENCOUNTER — Encounter: Payer: Medicaid Other | Admitting: Family Medicine

## 2020-11-01 NOTE — Progress Notes (Signed)
    SUBJECTIVE:   CHIEF COMPLAINT / HPI: Annual physical  Smoking Current 0.5 ppd. Quit before for 9 months cold [place], restarted for the past 1.5 years. Interested in quitting, but does not want medications.  Right shoulder pain Patient reporting new onset right shoulder pain starting a few days ago.  Described as an aching pain. Has not tried anything for pain. Denies any injury. Denies neck pain, weakness.  Mass, right wrist Patient noticed a mass on her right wrist about 6 months ago.  She feels like this is a piece of bone. Size of mass has been waxing and waning. Does bother her, would like this removed. Right now, she does not have much pain unless she touches it.  Obesity Patient was recently able to lose about 12 pounds over the past several weeks due to diet changes. She is requesting a referral for bariatric surgery, she has heard of this for 3 years and feels she is ready to proceed with bariatric surgery now.  HCM - needs influenza vaccine, amenable - not vaccinated against Covid, will consider.  Hesitant to get vaccinated because she had a recent Covid infection in August of this year (did not receive any monoclonal antibody treatment and did not require hospitalization) - needs hepatitis C screening, believes she has had this done already here - needs HIV screening, believes she has had this done already here   PERTINENT  PMH / PSH: Pre-diabetes, HTN, asthma, obesity, MDD  OBJECTIVE:   BP 134/82   Pulse 77   Ht 5\' 5"  (1.651 m)   Wt 264 lb (119.7 kg)   SpO2 98%   BMI 43.93 kg/m   General: Obese woman, NAD CV: RRR, no murmurs Pulm: CTAB, no wheezes or rales MSK: mild tenderness to palpation posterior right shoulder; 2 small adjacent ganglion cysts noted to right wrist Neuro: 5/5 strength shoulder abduction, adduction and grip strength  ASSESSMENT/PLAN:   Obesity Patient was able to lose 12 pounds over the past several weeks with dietary changes.   Requesting referral for bariatric surgery, she does meet criteria for bariatric surgery given her BMI. - referral to bariatric surgery  Tobacco dependence with current use Current 0.5 pack/day smoker.  Previously quit without any medication.  Does not want any medication to assist with cessation currently. - encouraged tobacco cessation, resources provided   Right shoulder pain Mild right shoulder pain starting over the past few days. Suspect OA. - recommended Tylenol  Ganglion cyst, right wrist Noticed over the past 6 months.  Gave patient option for watchful waiting versus referral for surgery.  This is bothersome for the patient, and she is opting for referral for surgery. - referral to hand surgeon  HCM - flu shot given - patient will consider covid vaccine - will revisit HIV and Hep C screening at next visit as these are not documented in the EMR  F/u 1 month, will need BMP (HTN), A1c (pre-diabetes)  Zola Button, MD [place]

## 2020-11-02 ENCOUNTER — Ambulatory Visit (INDEPENDENT_AMBULATORY_CARE_PROVIDER_SITE_OTHER): Payer: Medicaid Other | Admitting: Family Medicine

## 2020-11-02 ENCOUNTER — Other Ambulatory Visit: Payer: Self-pay

## 2020-11-02 ENCOUNTER — Encounter: Payer: Self-pay | Admitting: Family Medicine

## 2020-11-02 VITALS — BP 134/82 | HR 77 | Ht 65.0 in | Wt 264.0 lb

## 2020-11-02 DIAGNOSIS — Z23 Encounter for immunization: Secondary | ICD-10-CM

## 2020-11-02 DIAGNOSIS — M67431 Ganglion, right wrist: Secondary | ICD-10-CM

## 2020-11-02 DIAGNOSIS — F17209 Nicotine dependence, unspecified, with unspecified nicotine-induced disorders: Secondary | ICD-10-CM

## 2020-11-02 DIAGNOSIS — F17200 Nicotine dependence, unspecified, uncomplicated: Secondary | ICD-10-CM | POA: Diagnosis not present

## 2020-11-02 DIAGNOSIS — Z6841 Body Mass Index (BMI) 40.0 and over, adult: Secondary | ICD-10-CM | POA: Diagnosis not present

## 2020-11-02 MED ORDER — ALBUTEROL SULFATE HFA 108 (90 BASE) MCG/ACT IN AERS: 2.0000 | INHALATION_SPRAY | RESPIRATORY_TRACT | 0 refills | Status: DC | PRN

## 2020-11-02 MED ORDER — CHLORTHALIDONE 25 MG PO TABS: 25.0000 mg | ORAL_TABLET | Freq: Every day | ORAL | 3 refills | Status: DC

## 2020-11-02 MED ORDER — LISINOPRIL 40 MG PO TABS: 40.0000 mg | ORAL_TABLET | Freq: Every day | ORAL | 3 refills | Status: DC

## 2020-11-02 MED ORDER — FLUTICASONE PROPIONATE 50 MCG/ACT NA SUSP: 1.0000 | Freq: Every day | NASAL | 0 refills | Status: DC

## 2020-11-02 MED ORDER — AMLODIPINE BESYLATE 5 MG PO TABS: 5.0000 mg | ORAL_TABLET | Freq: Every day | ORAL | 3 refills | Status: DC

## 2020-11-02 NOTE — Patient Instructions (Addendum)
It was nice seeing you today, Yvana.  The bump on your right wrist is what I suspect is a ganglion cyst.  I have sent a referral for hand surgery to have this removed.  For your shoulder pain, I recommend trying Tylenol for the pain.  You can also try Tiger balm.  Keep up the good work with weight loss.  I have sent a referral for bariatric surgery.  Keep up the good work with quitting smoking.  I have attached further information about smoking cessation below.  Please see me in 1 month.  I would like to follow-up on your weight loss and also perform some blood work.  Stay well, Zola Button, MD   --  Quitting tobacco is the best thing for your health but is a challenge---nicotine, a chemical in cigarettes, is highly addictive.   You can call 1 800 QUIT NOW (1-423-827-2174)---you will be connected with a Artist. They can also mail you nicotine gums, lozenges, and patches to quit.   Ask me about patches (which you wear all day) and gums (which you use when you have a craving) to help you quit.   There are safe, effective medications to help you quit--  Varencline---also called Chantix---- is the most common medication used to help people stop smoking. It starts a low dose and is increased. I recommended choosing a quit date then starting the medication 8 days before this. Side effects include mild headache, difficulty sleeping, and odd dreams. The medication is typically very well tolerated.    Bupropion---also called Zyban---- is started 1 week before your quit date. You take 1 pill for three days then increase to 1 pill twice per day. Side effects include a mild headache and anxiety---this usually goes away. Some patients experience weight loss.

## 2020-11-02 NOTE — Assessment & Plan Note (Signed)
Current 0.5 pack/day smoker.  Previously quit without any medication.  Does not want any medication to assist with cessation currently. - encouraged tobacco cessation, resources provided

## 2020-11-02 NOTE — Assessment & Plan Note (Addendum)
Patient was able to lose 12 pounds over the past several weeks with dietary changes.  Requesting referral for bariatric surgery, she does meet criteria for bariatric surgery given her BMI. - referral to bariatric surgery

## 2020-11-26 ENCOUNTER — Ambulatory Visit: Payer: Medicaid Other | Admitting: Family Medicine

## 2021-01-03 ENCOUNTER — Other Ambulatory Visit: Payer: Self-pay | Admitting: Nurse Practitioner

## 2021-01-04 ENCOUNTER — Other Ambulatory Visit (HOSPITAL_COMMUNITY)
Admission: RE | Admit: 2021-01-04 | Discharge: 2021-01-04 | Disposition: A | Discharge status: Home / Self Care | Payer: Medicaid Other | Source: Ambulatory Visit | Attending: Nurse Practitioner | Admitting: Nurse Practitioner

## 2021-01-04 ENCOUNTER — Other Ambulatory Visit: Payer: Self-pay

## 2021-01-04 ENCOUNTER — Encounter: Payer: Self-pay | Admitting: Nurse Practitioner

## 2021-01-04 ENCOUNTER — Ambulatory Visit (INDEPENDENT_AMBULATORY_CARE_PROVIDER_SITE_OTHER): Payer: Medicaid Other | Admitting: Nurse Practitioner

## 2021-01-04 VITALS — BP 108/78 | HR 88 | Ht 65.0 in | Wt 270.0 lb

## 2021-01-04 DIAGNOSIS — N882 Stricture and stenosis of cervix uteri: Secondary | ICD-10-CM | POA: Diagnosis not present

## 2021-01-04 DIAGNOSIS — Z1231 Encounter for screening mammogram for malignant neoplasm of breast: Secondary | ICD-10-CM | POA: Diagnosis not present

## 2021-01-04 DIAGNOSIS — Z6841 Body Mass Index (BMI) 40.0 and over, adult: Secondary | ICD-10-CM

## 2021-01-04 DIAGNOSIS — T8332XA Displacement of intrauterine contraceptive device, initial encounter: Secondary | ICD-10-CM | POA: Diagnosis not present

## 2021-01-04 DIAGNOSIS — Z124 Encounter for screening for malignant neoplasm of cervix: Secondary | ICD-10-CM | POA: Diagnosis not present

## 2021-01-04 DIAGNOSIS — F17200 Nicotine dependence, unspecified, uncomplicated: Secondary | ICD-10-CM

## 2021-01-04 DIAGNOSIS — Z30432 Encounter for removal of intrauterine contraceptive device: Secondary | ICD-10-CM

## 2021-01-04 DIAGNOSIS — Z1211 Encounter for screening for malignant neoplasm of colon: Secondary | ICD-10-CM

## 2021-01-04 NOTE — Progress Notes (Signed)
GYNECOLOGY OFFICE VISIT NOTE   History:  45 y.o. G2P1011 here today for Pap smear and wants to be rescheduled for IUD removal.  Had surgery scheduled in Oct 2020 and it had to be cancelled due to Covid.Tracy Coleman Has stenosed cervix and IUD unable to be removed in the office. She denies any abnormal vaginal discharge, bleeding, pelvic pain or other concerns.   Sees PCP also at [facility] - had annual exam in Sept. 2021 but no pap or breast exam.  Past Medical History:  Diagnosis Date  . Anxiety   . Arthritis   . Asthma   . Depression   . Hypertension   . Obesity   . PTSD (post-traumatic stress disorder)     Past Surgical History:  Procedure Laterality Date  . ARTHRODESIS METATARSALPHALANGEAL JOINT (MTPJ) Right 04/11/2018   Procedure: ARTHRODESIS METATARSALPHALANGEAL JOINT (MTPJ);  Surgeon: Wylene Simmer, MD;  Location: [place];  Service: Orthopedics;  Laterality: Right;  . FOOT ARTHRODESIS Right 01/23/2019   Procedure: Open treatment of right navicular nonunion;  Surgeon: Wylene Simmer, MD;  Location: [place];  Service: Orthopedics;  Laterality: Right;  . LEEP     x2. Last one 2013    The following portions of the patient's history were reviewed and updated as appropriate: allergies, current medications, past family history, past medical history, past social history, past surgical history and problem list.   Health Maintenance:  Normal pap and negative HRHPV on 01-29-2019.  Also had ASCUS pap in 12-2018.  Has never had mammogram.  Review of Systems:  Pertinent items noted in HPI and remainder of comprehensive ROS otherwise negative.  Objective:  Physical Exam BP 108/78   Pulse 88   Ht 5\' 5"  (1.651 m)   Wt 270 lb (122.5 kg)   BMI 44.93 kg/m  CONSTITUTIONAL: Well-developed, well-nourished female in no acute distress.  HENT:  Normocephalic, atraumatic. External right and left ear normal.  EYES: Conjunctivae and EOM are normal. Pupils are  equal, round.  No scleral icterus.  NECK: Normal range of motion, supple, no masses SKIN: Skin is warm and dry. No rash noted. Not diaphoretic. No erythema. No pallor. NEUROLOGIC: Alert and oriented to person, place, and time. Normal muscle tone coordination. No cranial nerve deficit noted. PSYCHIATRIC: Normal mood and affect. Normal behavior. Normal judgment and thought content. CARDIOVASCULAR: Normal heart rate noted RESPIRATORY: Effort and breath sounds normal, no problems with respiration noted BREAST:  Bilateral - no masses identified ABDOMEN: Soft, no distention noted.   PELVIC: normal external genitalia, no discharge noted, Used long graves speculum and could see anterior cervix only.  Could not visualize os as it was too painful for client.  Pap done.  Bimanual done and could barely tip the cervix.  Exam limited by habitus. MUSCULOSKELETAL: Normal range of motion. No edema noted.  Labs and Imaging No results found.  Assessment & Plan:  1. Cervical cancer screening Had annual physcial in Sept 2021.  Will do breast exam and pap today.  - Cytology - PAP( [place])  2. Breast cancer screening by mammogram Scheduled  - MM 3D SCREEN BREAST BILATERAL; Future  3. Smoking Advised to quit.  Wants to quit.  Declines appointment with [facility] in the office at this time but may want a visit later.  Knows she needs to quit smoking.  4. BMI 40.0-44.9, adult [facility]) Has PCP at [facility] - is still considering bariatric surgery - knows she will need  to quit smoking to have the surgery  5.  IUD removal needed Has stenosed cervix and was originally scheduled for IUD surgical removal in Oct 2020,  Had to be postponed and client still needs IUD removal.  Will need to see MD for scheduling.  Note to admin staff to schedule with MD.  6. Encounter for screening colonoscopy Chart flagged for colonoscopy - Ambulatory referral to Gastroenterology   Routine preventative health  maintenance measures emphasized. Please refer to After Visit Summary for other counseling recommendations.   Return for Needs MD visit ASAP to be rescheduled for surgery (originally scheduled OCT 2020.   Total face-to-face time with patient: 20 minutes.  Over 50% of encounter was spent on counseling and coordination of care.  Earlie Server, RN, MSN, NP-BC Nurse Practitioner, [facility] for [employer], [facility] 01/04/2021 2:22 PM

## 2021-01-06 LAB — CYTOLOGY - PAP
Comment: NEGATIVE
Diagnosis: NEGATIVE
High risk HPV: NEGATIVE

## 2021-01-12 ENCOUNTER — Encounter: Payer: Self-pay | Admitting: Obstetrics & Gynecology

## 2021-01-12 ENCOUNTER — Other Ambulatory Visit: Payer: Self-pay

## 2021-01-12 ENCOUNTER — Ambulatory Visit (INDEPENDENT_AMBULATORY_CARE_PROVIDER_SITE_OTHER): Payer: Medicaid Other | Admitting: Obstetrics & Gynecology

## 2021-01-12 VITALS — BP 114/71 | HR 87 | Ht 65.0 in | Wt 269.5 lb

## 2021-01-12 DIAGNOSIS — T8332XD Displacement of intrauterine contraceptive device, subsequent encounter: Secondary | ICD-10-CM

## 2021-01-12 DIAGNOSIS — Z87410 Personal history of cervical dysplasia: Secondary | ICD-10-CM | POA: Diagnosis not present

## 2021-01-12 DIAGNOSIS — Z30431 Encounter for routine checking of intrauterine contraceptive device: Secondary | ICD-10-CM | POA: Diagnosis not present

## 2021-01-12 MED ORDER — MISOPROSTOL 200 MCG PO TABS: ORAL_TABLET | ORAL | 0 refills | Status: DC

## 2021-01-12 MED ORDER — IBUPROFEN 800 MG PO TABS: 800.0000 mg | ORAL_TABLET | Freq: Three times a day (TID) | ORAL | 0 refills | Status: DC | PRN

## 2021-01-13 DIAGNOSIS — Z87410 Personal history of cervical dysplasia: Secondary | ICD-10-CM | POA: Insufficient documentation

## 2021-01-13 NOTE — Progress Notes (Signed)
GYNECOLOGY  VISIT  CC:   Follow up for IUD removal  HPI: 45 y.o. G49P1011 Married Tracy Coleman here for discussion of IUD removal.  Pt was seen in early 2020 by Dr. Ilda Basset for colposcopy (for ASCUS with neg HR HPV pap) and IUD removal.  IUD string was not visualized and cervix appear stenotic.  She was scheduled for OR procedure for removal.  Covid occurred and all surgeries were cancelled.  She was then seen again in July 2020.  Plan was made for removal in the OR.  Surgery was scheduled in October.  Pt's husband had heart surgery and needed to reschedule.  She is here now to discuss this.  Pt and I discussed possible trial of removal in the office.  She is aware that I have done this before with difficult IUDs and feel it is worth an attempt prior to going to the OR.  Pt is actually pleased about this.  She and I also discussed the 7 year indication for IUD so it is not as "expired" as she feared.  Pt's IUD was placed in 2013.  Pt does have h/o LEEP in 2016.  Pap smears have been normal (normal with neg HR HPV) both in 2020 and in 2021.  Risks for removal in office reviewed in including pain, perforation risk as I will need to dilate the cervix, risks for bleeding, infection and need for OR procedure.  Pt is ok to try in office.  Will pre treat with cytotec and motrin.    Pt does not want another IUD placed.  She is aware that she will likely start cycling again and if she does not, I would recommend having blood work to assess menopausal status.    Ultrasound did show IUD present in endometrial cavity but in the mid portion of the endo 02/13/2019  GYNECOLOGIC HISTORY: No LMP recorded. (Menstrual status: IUD). Contraception: IUD  Patient Active Problem List   Diagnosis Date Noted  . Tobacco dependence with current use 11/02/2020  . Allergies 05/13/2019  . 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  . Closed fracture of navicular bone of foot 02/07/2019  . Atypical chest  pain 02/06/2019  . Asthma 02/06/2019  . Cervical stenosis (uterine cervix) 01/29/2019  . IUD strings lost 01/29/2019  . Cervical dysplasia 01/29/2019  . MDD (major depressive disorder), severe ([place]) 01/03/2019  . Contraception management 12/23/2018  . Pain in joint of right ankle 10/18/2018  . Acquired hallux rigidus 10/18/2018  . Arthritis of joint of toe 03/06/2018  . Essential hypertension 05/03/2013  . Obesity 05/03/2013  . Anxiety 05/03/2013  . Depressive disorder 05/03/2013    Past Medical History:  Diagnosis Date  . Anxiety   . Arthritis   . Asthma   . Depression   . Hypertension   . Obesity   . PTSD (post-traumatic stress disorder)     Past Surgical History:  Procedure Laterality Date  . ARTHRODESIS METATARSALPHALANGEAL JOINT (MTPJ) Right 04/11/2018   Procedure: ARTHRODESIS METATARSALPHALANGEAL JOINT (MTPJ);  Surgeon: Wylene Simmer, MD;  Location: [facility];  Service: Orthopedics;  Laterality: Right;  . FOOT ARTHRODESIS Right 01/23/2019   Procedure: Open treatment of right navicular nonunion;  Surgeon: Wylene Simmer, MD;  Location: [facility];  Service: Orthopedics;  Laterality: Right;  . LEEP     x2. Last one 2013    MEDS:   Current Outpatient Medications on File Prior to Visit  Medication Sig Dispense Refill  .  albuterol (PROVENTIL) (2.5 MG/3ML) 0.083% nebulizer solution Take 3 mLs (2.5 mg total) by nebulization every 6 (six) hours as needed for wheezing or shortness of breath. 75 mL 12  . albuterol (VENTOLIN HFA) 108 (90 Base) MCG/ACT inhaler Inhale 2 puffs into the lungs every 4 (four) hours as needed for wheezing or shortness of breath. 18 g 0  . amLODipine (NORVASC) 5 MG tablet Take 1 tablet (5 mg total) by mouth daily. 90 tablet 3  . busPIRone (BUSPAR) 10 MG tablet Take 10 mg by mouth 2 (two) times daily.    . cetirizine (ZYRTEC ALLERGY) 10 MG tablet Take 1 tablet (10 mg total) by mouth daily. 30 tablet 0  . chlorthalidone  (HYGROTON) 25 MG tablet Take 1 tablet (25 mg total) by mouth daily. 90 tablet 3  . desvenlafaxine (PRISTIQ) 100 MG 24 hr tablet Take 100 mg by mouth daily.    . fluticasone (FLONASE) 50 MCG/ACT nasal spray Place 1 spray into both nostrils daily. 16 g 0  . hydrOXYzine (VISTARIL) 50 MG capsule hydroxyzine pamoate 50 mg capsule  TAKE 1 CAPSULE BY MOUTH AT BEDTIME    . levonorgestrel (MIRENA) 20 MCG/24HR IUD 1 each by Intrauterine route once.     Marland Kitchen lisinopril (ZESTRIL) 40 MG tablet Take 1 tablet (40 mg total) by mouth daily. 90 tablet 3  . traZODone (DESYREL) 100 MG tablet trazodone 100 mg tablet  TAKE 2 TABLETS BY MOUTH AT BEDTIME     No current facility-administered medications on file prior to visit.    ALLERGIES: Varenicline  Family History  Problem Relation Age of Onset  . Alcohol abuse Mother   . Drug abuse Mother   . Depression Mother   . Early death Mother        pneumonia, age 41  . Hypertension Mother   . Alcohol abuse Father   . Drug abuse Father   . Diabetes Maternal Grandmother   . Hypertension Maternal Grandmother   . Diabetes Paternal Grandmother   . Hypertension Paternal Grandmother     SH:  Married, non smoker  Review of Systems  All other systems reviewed and are negative.   PHYSICAL EXAMINATION:    BP 114/71   Pulse 87   Ht 5\' 5"  (1.651 m)   Wt 269 lb 8 oz (122.2 kg)   BMI 44.85 kg/m      Physical Exam Constitutional:      Appearance: Normal appearance.  Neurological:     General: No focal deficit present.     Mental Status: She is alert.  Psychiatric:        Mood and Affect: Mood normal.     Assessment/Plan: 1. Intrauterine contraceptive device threads lost, subsequent encounter - pt will return Monday for attempt at IUD removal in the office.  Will pre treat with Cytotec and ibuprofen - misoprostol (CYTOTEC) 200 MCG tablet; Place vaginally pm and am before procedure  Dispense: 2 tablet; Refill: 0 - ibuprofen (ADVIL) 800 MG tablet; Take 1  tablet (800 mg total) by mouth every 8 (eight) hours as needed.  Dispense: 20 tablet; Refill: 0  2. Malpositioned intrauterine device (IUD), subsequent encounter  12 minutes of total time was spent for this patient encounter, including preparation, face-to-face counseling with the patient and coordination of care, and documentation of the encounter.

## 2021-01-17 ENCOUNTER — Ambulatory Visit (INDEPENDENT_AMBULATORY_CARE_PROVIDER_SITE_OTHER): Payer: Medicaid Other | Admitting: Obstetrics & Gynecology

## 2021-01-17 ENCOUNTER — Encounter: Payer: Self-pay | Admitting: Obstetrics & Gynecology

## 2021-01-17 ENCOUNTER — Other Ambulatory Visit: Payer: Self-pay

## 2021-01-17 VITALS — BP 135/85 | HR 118 | Ht 65.0 in | Wt 269.4 lb

## 2021-01-17 DIAGNOSIS — N882 Stricture and stenosis of cervix uteri: Secondary | ICD-10-CM | POA: Diagnosis not present

## 2021-01-17 DIAGNOSIS — T8332XD Displacement of intrauterine contraceptive device, subsequent encounter: Secondary | ICD-10-CM

## 2021-01-17 NOTE — Progress Notes (Signed)
45 y.o. G48P1011 Married [place] American female presents for removal of Mirena IUD.  Pt has stenotic os and is here for an attempt at removal in the office.  She does not want another IUD placed.   Pt has also been counseled about risks and benefits as well as complications.  Consent is obtained today.  All questions answered prior to start of procedure.   LMP:  Has amenorrhea with IUD  Patient Active Problem List   Diagnosis Date Noted  . History of cervical dysplasia 01/13/2021  . Tobacco dependence with current use 11/02/2020  . Allergies 05/13/2019  . 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  . Closed fracture of navicular bone of foot 02/07/2019  . Atypical chest pain 02/06/2019  . Asthma 02/06/2019  . Cervical stenosis (uterine cervix) 01/29/2019  . IUD strings lost 01/29/2019  . MDD (major depressive disorder), severe ([place]) 01/03/2019  . Contraception management 12/23/2018  . Pain in joint of right ankle 10/18/2018  . Acquired hallux rigidus 10/18/2018  . Arthritis of joint of toe 03/06/2018  . Essential hypertension 05/03/2013  . Obesity 05/03/2013  . Anxiety 05/03/2013  . Depressive disorder 05/03/2013   Past Medical History:  Diagnosis Date  . Anxiety   . Arthritis   . Asthma   . Depression   . Hypertension   . Obesity   . PTSD (post-traumatic stress disorder)    Current Outpatient Medications on File Prior to Visit  Medication Sig Dispense Refill  . albuterol (PROVENTIL) (2.5 MG/3ML) 0.083% nebulizer solution Take 3 mLs (2.5 mg total) by nebulization every 6 (six) hours as needed for wheezing or shortness of breath. 75 mL 12  . albuterol (VENTOLIN HFA) 108 (90 Base) MCG/ACT inhaler Inhale 2 puffs into the lungs every 4 (four) hours as needed for wheezing or shortness of breath. 18 g 0  . amLODipine (NORVASC) 5 MG tablet Take 1 tablet (5 mg total) by mouth daily. 90 tablet 3  . busPIRone (BUSPAR) 10 MG tablet Take 10 mg by mouth 2 (two) times daily.    .  cetirizine (ZYRTEC ALLERGY) 10 MG tablet Take 1 tablet (10 mg total) by mouth daily. 30 tablet 0  . chlorthalidone (HYGROTON) 25 MG tablet Take 1 tablet (25 mg total) by mouth daily. 90 tablet 3  . desvenlafaxine (PRISTIQ) 100 MG 24 hr tablet Take 100 mg by mouth daily.    . fluticasone (FLONASE) 50 MCG/ACT nasal spray Place 1 spray into both nostrils daily. 16 g 0  . hydrOXYzine (VISTARIL) 50 MG capsule hydroxyzine pamoate 50 mg capsule  TAKE 1 CAPSULE BY MOUTH AT BEDTIME    . ibuprofen (ADVIL) 800 MG tablet Take 1 tablet (800 mg total) by mouth every 8 (eight) hours as needed. 20 tablet 0  . levonorgestrel (MIRENA) 20 MCG/24HR IUD 1 each by Intrauterine route once.     Marland Kitchen lisinopril (ZESTRIL) 40 MG tablet Take 1 tablet (40 mg total) by mouth daily. 90 tablet 3  . traZODone (DESYREL) 100 MG tablet trazodone 100 mg tablet  TAKE 2 TABLETS BY MOUTH AT BEDTIME     No current facility-administered medications on file prior to visit.   Varenicline  Review of Systems  Psychiatric/Behavioral: The patient does not have insomnia.   All other systems reviewed and are negative.  Vitals:   01/17/21 1503  BP: 135/85  Pulse: (!) 118  Weight: 269 lb 6.4 oz (122.2 kg)  Height: 5\' 5"  (1.651 m)    Gen:  WNWF healthy female  NAD Abdomen: soft, non-tender Groin:  no inguinal nodes palpated  Pelvic exam: Vulva:  normal female genitalia Vagina:  normal vagina Cervix:  Non-tender, Negative CMT, no lesions or redness. IUD string cannot be visualized Uterus:  normal shape, position and consistency   Procedure:  Speculum reinserted.  Cervix visualized and cleansed with Betadine x 3. Single toothed tenaculum applied to anterior lip of cervix without difficulty.  IUD string could not be seen.  Attempt made to dilate cervix.  Due to pt pain and discomfort, procedure was stopped.  Tenaculum removed from cervix.  Minimal bleeding noted.  Pt's cramping resolved quickly.  She tolerated attempted removal well.     Surgical procedure for removal discussed including risks and benefits of bleeding, infection, uterine perforation, need for additional surgery, bowel/bladder/ureteral injury.  Will proceed with IUD removal under ultrasound guidance in the OR.  Assessment/Plan: 1. Cervical stenosis (uterine cervix) -Failed IUD removal today.  Will proceed with scheduling IUD removal in the OR with ultrasound guidance.    2. Intrauterine contraceptive device threads lost, subsequent encounter

## 2021-01-26 ENCOUNTER — Other Ambulatory Visit: Payer: Self-pay | Admitting: Obstetrics & Gynecology

## 2021-01-26 ENCOUNTER — Other Ambulatory Visit (HOSPITAL_COMMUNITY): Payer: Self-pay | Admitting: Obstetrics & Gynecology

## 2021-01-26 DIAGNOSIS — T8332XA Displacement of intrauterine contraceptive device, initial encounter: Secondary | ICD-10-CM

## 2021-02-10 ENCOUNTER — Encounter (HOSPITAL_BASED_OUTPATIENT_CLINIC_OR_DEPARTMENT_OTHER): Payer: Self-pay | Admitting: Obstetrics & Gynecology

## 2021-02-10 ENCOUNTER — Other Ambulatory Visit: Payer: Self-pay

## 2021-02-10 NOTE — Progress Notes (Signed)
Spoke w/ via phone for pre-op interview---pt Lab needs dos----  I stat ekg urine poct             Lab results------none COVID test ------02-12-2021 1030 Arrive at -------1215 02-16-2021 NPO after MN NO Solid Food.  Clear liquids from MN until---1115 am then npo Medications to take morning of surgery -----albuterol nebulizer, albuterol inhaler prn/bring inhaler, amlodipine, certrizine, flonase, buspirone, pristiq Diabetic medication -----n/a Patient Special Instructions -----none Pre-Op special Istructions -----none Patient verbalized understanding of instructions that were given at this phone interview. Patient denies shortness of breath, chest pain, fever, cough at this phone interview.

## 2021-02-11 ENCOUNTER — Encounter (HOSPITAL_BASED_OUTPATIENT_CLINIC_OR_DEPARTMENT_OTHER): Payer: Self-pay | Admitting: Obstetrics & Gynecology

## 2021-02-11 ENCOUNTER — Other Ambulatory Visit (HOSPITAL_BASED_OUTPATIENT_CLINIC_OR_DEPARTMENT_OTHER): Payer: Self-pay | Admitting: Obstetrics & Gynecology

## 2021-02-12 ENCOUNTER — Other Ambulatory Visit (HOSPITAL_COMMUNITY)
Admission: RE | Admit: 2021-02-12 | Discharge: 2021-02-12 | Disposition: A | Discharge status: Home / Self Care | Payer: Medicaid Other | Source: Ambulatory Visit | Attending: Obstetrics & Gynecology | Admitting: Obstetrics & Gynecology

## 2021-02-12 DIAGNOSIS — Z20822 Contact with and (suspected) exposure to covid-19: Secondary | ICD-10-CM | POA: Diagnosis not present

## 2021-02-12 DIAGNOSIS — Z01812 Encounter for preprocedural laboratory examination: Secondary | ICD-10-CM | POA: Diagnosis present

## 2021-02-12 LAB — SARS CORONAVIRUS 2 (TAT 6-24 HRS): SARS Coronavirus 2: NEGATIVE

## 2021-02-16 ENCOUNTER — Encounter (HOSPITAL_BASED_OUTPATIENT_CLINIC_OR_DEPARTMENT_OTHER): Payer: Self-pay | Admitting: Obstetrics & Gynecology

## 2021-02-16 ENCOUNTER — Ambulatory Visit (HOSPITAL_COMMUNITY)
Admission: RE | Admit: 2021-02-16 | Discharge: 2021-02-16 | Disposition: A | Discharge status: Home / Self Care | Payer: Medicaid Other | Source: Ambulatory Visit | Attending: Obstetrics & Gynecology | Admitting: Obstetrics & Gynecology

## 2021-02-16 ENCOUNTER — Other Ambulatory Visit: Payer: Self-pay

## 2021-02-16 ENCOUNTER — Ambulatory Visit (HOSPITAL_BASED_OUTPATIENT_CLINIC_OR_DEPARTMENT_OTHER): Payer: Medicaid Other | Admitting: Anesthesiology

## 2021-02-16 ENCOUNTER — Ambulatory Visit (HOSPITAL_BASED_OUTPATIENT_CLINIC_OR_DEPARTMENT_OTHER)
Admission: RE | Admit: 2021-02-16 | Discharge: 2021-02-16 | Disposition: A | Discharge status: Home / Self Care | Payer: Medicaid Other | Attending: Obstetrics & Gynecology | Admitting: Obstetrics & Gynecology

## 2021-02-16 ENCOUNTER — Ambulatory Visit: Payer: Medicaid Other

## 2021-02-16 ENCOUNTER — Encounter (HOSPITAL_BASED_OUTPATIENT_CLINIC_OR_DEPARTMENT_OTHER)
Admission: RE | Disposition: A | Discharge status: Home / Self Care | Payer: Self-pay | Source: Home / Self Care | Attending: Obstetrics & Gynecology

## 2021-02-16 DIAGNOSIS — Y768 Miscellaneous obstetric and gynecological devices associated with adverse incidents, not elsewhere classified: Secondary | ICD-10-CM | POA: Diagnosis not present

## 2021-02-16 DIAGNOSIS — Z79899 Other long term (current) drug therapy: Secondary | ICD-10-CM | POA: Insufficient documentation

## 2021-02-16 DIAGNOSIS — T8332XA Displacement of intrauterine contraceptive device, initial encounter: Secondary | ICD-10-CM

## 2021-02-16 DIAGNOSIS — Z30432 Encounter for removal of intrauterine contraceptive device: Secondary | ICD-10-CM | POA: Insufficient documentation

## 2021-02-16 DIAGNOSIS — F17210 Nicotine dependence, cigarettes, uncomplicated: Secondary | ICD-10-CM | POA: Diagnosis not present

## 2021-02-16 DIAGNOSIS — N882 Stricture and stenosis of cervix uteri: Secondary | ICD-10-CM | POA: Diagnosis not present

## 2021-02-16 DIAGNOSIS — Z793 Long term (current) use of hormonal contraceptives: Secondary | ICD-10-CM | POA: Insufficient documentation

## 2021-02-16 DIAGNOSIS — Z888 Allergy status to other drugs, medicaments and biological substances status: Secondary | ICD-10-CM | POA: Diagnosis not present

## 2021-02-16 DIAGNOSIS — Z8616 Personal history of COVID-19: Secondary | ICD-10-CM | POA: Diagnosis not present

## 2021-02-16 DIAGNOSIS — T8332XD Displacement of intrauterine contraceptive device, subsequent encounter: Secondary | ICD-10-CM

## 2021-02-16 HISTORY — DX: Dyspnea, unspecified: R06.00

## 2021-02-16 HISTORY — DX: Presence of spectacles and contact lenses: Z97.3

## 2021-02-16 HISTORY — PX: HYSTEROSCOPY WITH D & C: SHX1775

## 2021-02-16 HISTORY — PX: OPERATIVE ULTRASOUND: SHX5996

## 2021-02-16 HISTORY — DX: Gastro-esophageal reflux disease without esophagitis: K21.9

## 2021-02-16 LAB — BASIC METABOLIC PANEL
Anion gap: 12 (ref 5–15)
BUN: 14 mg/dL (ref 6–20)
CO2: 24 mmol/L (ref 22–32)
Calcium: 9.1 mg/dL (ref 8.9–10.3)
Chloride: 101 mmol/L (ref 98–111)
Creatinine, Ser: 0.81 mg/dL (ref 0.44–1.00)
GFR, Estimated: >60 mL/min (ref >60)
Glucose, Bld: 114 mg/dL — ABNORMAL HIGH (ref 70–99)
Potassium: 3.3 mmol/L — ABNORMAL LOW (ref 3.5–5.1)
Sodium: 137 mmol/L (ref 135–145)

## 2021-02-16 LAB — TYPE AND SCREEN
ABO/RH(D): O POS
Antibody Screen: NEGATIVE

## 2021-02-16 LAB — CBC
HCT: 40.6 % (ref 36.0–46.0)
Hemoglobin: 13.9 g/dL (ref 12.0–15.0)
MCH: 31.2 pg (ref 26.0–34.0)
MCHC: 34.2 g/dL (ref 30.0–36.0)
MCV: 91 fL (ref 80.0–100.0)
Platelets: 405 10*3/uL — ABNORMAL HIGH (ref 150–400)
RBC: 4.46 MIL/uL (ref 3.87–5.11)
RDW: 14 % (ref 11.5–15.5)
WBC: 9 10*3/uL (ref 4.0–10.5)
nRBC: 0 % (ref 0.0–0.2)

## 2021-02-16 LAB — ABO/RH: ABO/RH(D): O POS

## 2021-02-16 LAB — POCT PREGNANCY, URINE: Preg Test, Ur: NEGATIVE

## 2021-02-16 SURGERY — DILATATION AND CURETTAGE /HYSTEROSCOPY
Anesthesia: General | Site: Vagina

## 2021-02-16 MED ORDER — MIDAZOLAM HCL 2 MG/2ML IJ SOLN
INTRAMUSCULAR | Status: AC
  Filled 2021-02-16: qty 2

## 2021-02-16 MED ORDER — ONDANSETRON HCL 4 MG/2ML IJ SOLN
INTRAMUSCULAR | Status: AC
  Filled 2021-02-16: qty 2

## 2021-02-16 MED ORDER — OXYCODONE HCL 5 MG PO TABS
5.0000 mg | ORAL_TABLET | Freq: Once | ORAL | Status: AC | PRN
  Administered 2021-02-16: 5 mg via ORAL

## 2021-02-16 MED ORDER — OXYCODONE HCL 5 MG PO TABS
ORAL_TABLET | ORAL | Status: AC
  Filled 2021-02-16: qty 1

## 2021-02-16 MED ORDER — FENTANYL CITRATE (PF) 100 MCG/2ML IJ SOLN
INTRAMUSCULAR | Status: DC | PRN
  Administered 2021-02-16 (×4): 50 ug via INTRAVENOUS

## 2021-02-16 MED ORDER — ACETAMINOPHEN 500 MG PO TABS
ORAL_TABLET | ORAL | Status: AC
  Filled 2021-02-16: qty 2

## 2021-02-16 MED ORDER — FENTANYL CITRATE (PF) 100 MCG/2ML IJ SOLN
INTRAMUSCULAR | Status: AC
  Filled 2021-02-16: qty 2

## 2021-02-16 MED ORDER — HYDROCODONE-ACETAMINOPHEN 5-325 MG PO TABS: 1.0000 | ORAL_TABLET | Freq: Four times a day (QID) | ORAL | 0 refills | Status: DC | PRN

## 2021-02-16 MED ORDER — FENTANYL CITRATE (PF) 100 MCG/2ML IJ SOLN
25.0000 ug | INTRAMUSCULAR | Status: DC | PRN
  Administered 2021-02-16 (×2): 50 ug via INTRAVENOUS

## 2021-02-16 MED ORDER — PROPOFOL 10 MG/ML IV BOLUS
INTRAVENOUS | Status: DC | PRN
  Administered 2021-02-16 (×2): 200 mg via INTRAVENOUS

## 2021-02-16 MED ORDER — KETOROLAC TROMETHAMINE 30 MG/ML IJ SOLN
INTRAMUSCULAR | Status: DC | PRN
  Administered 2021-02-16: 30 mg via INTRAVENOUS

## 2021-02-16 MED ORDER — DEXAMETHASONE SODIUM PHOSPHATE 10 MG/ML IJ SOLN
INTRAMUSCULAR | Status: AC
  Filled 2021-02-16: qty 1

## 2021-02-16 MED ORDER — LIDOCAINE-EPINEPHRINE 1 %-1:100000 IJ SOLN
INTRAMUSCULAR | Status: DC | PRN
  Administered 2021-02-16: 6 mL

## 2021-02-16 MED ORDER — LIDOCAINE 2% (20 MG/ML) 5 ML SYRINGE
INTRAMUSCULAR | Status: DC | PRN
  Administered 2021-02-16: 100 mg via INTRAVENOUS

## 2021-02-16 MED ORDER — KETOROLAC TROMETHAMINE 30 MG/ML IJ SOLN
INTRAMUSCULAR | Status: AC
  Filled 2021-02-16: qty 1

## 2021-02-16 MED ORDER — PROPOFOL 10 MG/ML IV BOLUS
INTRAVENOUS | Status: AC
  Filled 2021-02-16: qty 20

## 2021-02-16 MED ORDER — DEXAMETHASONE SODIUM PHOSPHATE 10 MG/ML IJ SOLN
INTRAMUSCULAR | Status: DC | PRN
  Administered 2021-02-16: 10 mg via INTRAVENOUS

## 2021-02-16 MED ORDER — LIDOCAINE 2% (20 MG/ML) 5 ML SYRINGE
INTRAMUSCULAR | Status: AC
  Filled 2021-02-16: qty 5

## 2021-02-16 MED ORDER — AMISULPRIDE (ANTIEMETIC) 5 MG/2ML IV SOLN: 10.0000 mg | Freq: Once | INTRAVENOUS | Status: DC | PRN

## 2021-02-16 MED ORDER — ACETAMINOPHEN 500 MG PO TABS
1000.0000 mg | ORAL_TABLET | Freq: Once | ORAL | Status: AC
  Administered 2021-02-16: 1000 mg via ORAL

## 2021-02-16 MED ORDER — IBUPROFEN 800 MG PO TABS: 800.0000 mg | ORAL_TABLET | Freq: Three times a day (TID) | ORAL | 0 refills | Status: DC | PRN

## 2021-02-16 MED ORDER — MIDAZOLAM HCL 5 MG/5ML IJ SOLN
INTRAMUSCULAR | Status: DC | PRN
  Administered 2021-02-16: 2 mg via INTRAVENOUS

## 2021-02-16 MED ORDER — POVIDONE-IODINE 10 % EX SWAB: 2.0000 "application " | Freq: Once | CUTANEOUS | Status: DC

## 2021-02-16 MED ORDER — SODIUM CHLORIDE 0.9 % IR SOLN
Status: DC | PRN
  Administered 2021-02-16: 3000 mL

## 2021-02-16 MED ORDER — OXYCODONE HCL 5 MG/5ML PO SOLN: 5.0000 mg | Freq: Once | ORAL | Status: AC | PRN

## 2021-02-16 MED ORDER — LACTATED RINGERS IV SOLN: INTRAVENOUS | Status: DC

## 2021-02-16 MED ORDER — PHENYLEPHRINE 40 MCG/ML (10ML) SYRINGE FOR IV PUSH (FOR BLOOD PRESSURE SUPPORT)
PREFILLED_SYRINGE | INTRAVENOUS | Status: AC
  Filled 2021-02-16: qty 10

## 2021-02-16 MED ORDER — PHENYLEPHRINE 40 MCG/ML (10ML) SYRINGE FOR IV PUSH (FOR BLOOD PRESSURE SUPPORT)
PREFILLED_SYRINGE | INTRAVENOUS | Status: DC | PRN
  Administered 2021-02-16: 120 ug via INTRAVENOUS

## 2021-02-16 MED ORDER — ONDANSETRON HCL 4 MG/2ML IJ SOLN
INTRAMUSCULAR | Status: DC | PRN
  Administered 2021-02-16: 4 mg via INTRAVENOUS

## 2021-02-16 SURGICAL SUPPLY — 13 items
COVER WAND RF STERILE (DRAPES) ×3 IMPLANT
DILATOR CANAL MILEX (MISCELLANEOUS) ×3 IMPLANT
GAUZE 4X4 16PLY RFD (DISPOSABLE) ×3 IMPLANT
GLOVE SURG LTX SZ6.5 (GLOVE) ×6 IMPLANT
GLOVE SURG UNDER POLY LF SZ7 (GLOVE) ×9 IMPLANT
GOWN STRL REUS W/TWL LRG LVL3 (GOWN DISPOSABLE) ×6 IMPLANT
IV NS IRRIG 3000ML ARTHROMATIC (IV SOLUTION) ×3 IMPLANT
KIT PROCEDURE FLUENT (KITS) ×3 IMPLANT
KIT TURNOVER CYSTO (KITS) ×3 IMPLANT
PACK VAGINAL MINOR WOMEN LF (CUSTOM PROCEDURE TRAY) ×3 IMPLANT
PAD OB MATERNITY 4.3X12.25 (PERSONAL CARE ITEMS) ×3 IMPLANT
SEAL ROD LENS SCOPE MYOSURE (ABLATOR) ×3 IMPLANT
TOWEL OR 17X26 10 PK STRL BLUE (TOWEL DISPOSABLE) ×3 IMPLANT

## 2021-02-16 NOTE — Anesthesia Preprocedure Evaluation (Signed)
Anesthesia Evaluation    Airway Mallampati: II  TM Distance: >3 FB Neck ROM: Full    Dental  (+) Dental Advisory Given   Pulmonary asthma , Current Smoker,    breath sounds clear to auscultation       Cardiovascular hypertension, Pt. on medications  Rhythm:Regular Rate:Normal     Neuro/Psych negative neurological ROS     GI/Hepatic Neg liver ROS, GERD  ,  Endo/Other  Morbid obesity  Renal/GU negative Renal ROS     Musculoskeletal  (+) Arthritis ,   Abdominal   Peds  Hematology negative hematology ROS (+)   Anesthesia Other Findings   Reproductive/Obstetrics                             Anesthesia Physical Anesthesia Plan  ASA: III  Anesthesia Plan: General   Post-op Pain Management:    Induction: Intravenous  PONV Risk Score and Plan: 3 and Ondansetron, Dexamethasone, Treatment may vary due to age or medical condition and Midazolam  Airway Management Planned: LMA  Additional Equipment:   Intra-op Plan:   Post-operative Plan: Extubation in OR  Informed Consent: I have reviewed the patients History and Physical, chart, labs and discussed the procedure including the risks, benefits and alternatives for the proposed anesthesia with the patient or authorized representative who has indicated his/her understanding and acceptance.     Dental advisory given  Plan Discussed with: CRNA  Anesthesia Plan Comments:         Anesthesia Quick Evaluation

## 2021-02-16 NOTE — H&P (Signed)
Tracy Coleman is an 45 y.o. female G2P1 MAA here for removal of IUD with ultrasound guidance and possible hysteroscopy.  Pt has IUD that is in place but string cannot be visualized.  Attempt made at removal of IUD in the office but this was stopped due to patient discomfort.  Removal in the OR with ultrasound guidance recommended.  Possible hysteroscopy may be needed.  Risks and benefits have been discussed.  There really is no other option for this procedure but to leave it in place and pt does not desire this.  Questions answered.  Pt ready to proceed.  Pertinent Gynecological History: Menses: has amenorrhea with IUD Contraception: IUD DES exposure: denies Blood transfusions: none Sexually transmitted diseases: no past history Previous GYN Procedures: prior LEEP  Last mammogram: scheduled 04/08/2021 Last pap: normal Date: 01/04/2021 OB History: G2, P1, A1   Menstrual History: No LMP recorded. (Menstrual status: IUD).    Past Medical History:  Diagnosis Date  . Anxiety   . Arthritis    feet oa  . Asthma   . COVID 07/2020   loss of smell headache body aches x 7 days all symptoms resolved  . Depression   . Dyspnea    with exertion  . GERD (gastroesophageal reflux disease)   . Hypertension   . Obesity   . PTSD (post-traumatic stress disorder)   . Wears glasses     Past Surgical History:  Procedure Laterality Date  . ARTHRODESIS METATARSALPHALANGEAL JOINT (MTPJ) Right 04/11/2018   Procedure: ARTHRODESIS METATARSALPHALANGEAL JOINT (MTPJ);  Surgeon: Wylene Simmer, MD;  Location: [facility];  Service: Orthopedics;  Laterality: Right;  . FOOT ARTHRODESIS Right 01/23/2019   Procedure: Open treatment of right navicular nonunion;  Surgeon: Wylene Simmer, MD;  Location: [facility];  Service: Orthopedics;  Laterality: Right;  . LEEP     x2. Last one 2013    Family History  Problem Relation Age of Onset  . Alcohol abuse Mother   . Drug abuse Mother   .  Depression Mother   . Early death Mother        pneumonia, age 21  . Hypertension Mother   . Alcohol abuse Father   . Drug abuse Father   . Diabetes Maternal Grandmother   . Hypertension Maternal Grandmother   . Diabetes Paternal Grandmother   . Hypertension Paternal Grandmother     Social History:  reports that she has been smoking cigarettes. She has a 15.00 pack-year smoking history. She has never used smokeless tobacco. She reports current alcohol use. She reports previous drug use.  Allergies:  Allergies  Allergen Reactions  . Varenicline Other (See Comments)    May have contributed to suicidal ideation May have contributed to suicidal ideation chantix    Medications Prior to Admission  Medication Sig Dispense Refill Last Dose  . albuterol (VENTOLIN HFA) 108 (90 Base) MCG/ACT inhaler Inhale 2 puffs into the lungs every 4 (four) hours as needed for wheezing or shortness of breath. 18 g 0 Past Month at Unknown time  . amLODipine (NORVASC) 5 MG tablet Take 1 tablet (5 mg total) by mouth daily. 90 tablet 3 02/15/2021 at Unknown time  . busPIRone (BUSPAR) 10 MG tablet Take 10 mg by mouth 2 (two) times daily.   02/15/2021 at Unknown time  . desvenlafaxine (PRISTIQ) 100 MG 24 hr tablet Take 100 mg by mouth daily.   02/15/2021 at Unknown time  . hydrOXYzine (VISTARIL) 50 MG capsule hydroxyzine pamoate 50 mg  capsule  TAKE 1 CAPSULE BY MOUTH AT BEDTIME   02/15/2021 at Unknown time  . ibuprofen (ADVIL) 800 MG tablet Take 1 tablet (800 mg total) by mouth every 8 (eight) hours as needed. 20 tablet 0 Past Month at Unknown time  . lisinopril (ZESTRIL) 40 MG tablet Take 1 tablet (40 mg total) by mouth daily. 90 tablet 3 02/15/2021 at Unknown time  . traZODone (DESYREL) 100 MG tablet trazodone 100 mg tablet  TAKE 2 TABLETS BY MOUTH AT BEDTIME   Past Month at Unknown time  . albuterol (PROVENTIL) (2.5 MG/3ML) 0.083% nebulizer solution Take 3 mLs (2.5 mg total) by nebulization every 6 (six) hours as  needed for wheezing or shortness of breath. 75 mL 12 More than a month at Unknown time  . cetirizine (ZYRTEC ALLERGY) 10 MG tablet Take 1 tablet (10 mg total) by mouth daily. 30 tablet 0 More than a month at Unknown time  . chlorthalidone (HYGROTON) 25 MG tablet Take 1 tablet (25 mg total) by mouth daily. 90 tablet 3 More than a month at Unknown time  . fluticasone (FLONASE) 50 MCG/ACT nasal spray Place 1 spray into both nostrils daily. 16 g 0 More than a month at Unknown time  . levonorgestrel (MIRENA) 20 MCG/24HR IUD 1 each by Intrauterine route once.     at in place    Review of Systems  Musculoskeletal:       Left ankle and leg pain today  All other systems reviewed and are negative.   Blood pressure 140/78, pulse (!) 109, temperature 98.3 F (36.8 C), temperature source Oral, resp. rate (!) 22, height 5\' 5"  (1.651 m), weight 118.5 kg, SpO2 (!) 8 %. Physical Exam Constitutional:      Appearance: Normal appearance.  Cardiovascular:     Rate and Rhythm: Normal rate and regular rhythm.     Pulses: Normal pulses.     Heart sounds: Normal heart sounds.  Pulmonary:     Effort: Pulmonary effort is normal.     Breath sounds: Normal breath sounds.  Neurological:     General: No focal deficit present.     Mental Status: She is alert.  Psychiatric:        Mood and Affect: Mood normal.     Results for orders placed or performed during the hospital encounter of 02/16/21 (from the past 24 hour(s))  Pregnancy, urine POC     Status: None   Collection Time: 02/16/21 12:04 PM  Result Value Ref Range   Preg Test, Ur NEGATIVE NEGATIVE  Type and screen     Status: None   Collection Time: 02/16/21 12:27 PM  Result Value Ref Range   ABO/RH(D) O POS    Antibody Screen NEG    Sample Expiration      02/19/2021,2359 Performed at [facility], [place] [address] [place],  [address]   CBC     Status: Abnormal   Collection Time: 02/16/21 12:30 PM  Result Value Ref Range    WBC 9.0 4.0 - 10.5 K/uL   RBC 4.46 3.87 - 5.11 MIL/uL   Hemoglobin 13.9 12.0 - 15.0 g/dL   HCT 40.6 36.0 - 46.0 %   MCV 91.0 80.0 - 100.0 fL   MCH 31.2 26.0 - 34.0 pg   MCHC 34.2 30.0 - 36.0 g/dL   RDW 14.0 11.5 - 15.5 %   Platelets 405 (H) 150 - 400 K/uL   nRBC 0.0 0.0 - 0.2 %  Basic metabolic panel  Status: Abnormal   Collection Time: 02/16/21 12:30 PM  Result Value Ref Range   Sodium 137 135 - 145 mmol/L   Potassium 3.3 (L) 3.5 - 5.1 mmol/L   Chloride 101 98 - 111 mmol/L   CO2 24 22 - 32 mmol/L   Glucose, Bld 114 (H) 70 - 99 mg/dL   BUN 14 6 - 20 mg/dL   Creatinine, Ser 0.81 0.44 - 1.00 mg/dL   Calcium 9.1 8.9 - 10.3 mg/dL   GFR, Estimated >60 >60 mL/min   Anion gap 12 5 - 15  ABO/Rh     Status: None   Collection Time: 02/16/21 12:30 PM  Result Value Ref Range   ABO/RH(D)      O POS Performed at [facility], [place] [address] [place], [place] [address]     No results found.  Assessment/Plan: 45 yo G2P1A1 MAA female with retained IUD, cervical stenosis, failed attempt at office removal due to pt discomfort here for removal with IUD guidance and possible hysteroscopy.  Questions answered.  Pt ready to proceed.  Megan Salon 02/16/2021, 2:16 PM

## 2021-02-16 NOTE — Op Note (Signed)
02/16/2021  4:27 PM  PATIENT:  Tracy Coleman  45 y.o. female  PRE-OPERATIVE DIAGNOSIS:  IUD with lost strings, cervical stenosis  POST-OPERATIVE DIAGNOSIS:   IUD with lost strings, cervical stenosis  PROCEDURE:  Procedure(s): HYSTEROSCOPY AND IUD REMOVAL OPERATIVE ULTRASOUND  SURGEON:  Megan Salon  ASSISTANTS: OR staff.   ANESTHESIA:   general  ESTIMATED BLOOD LOSS: 2 mL  BLOOD ADMINISTERED:none   FLUIDS: 1300cc LR  UOP: no cath was performed in the OR  SPECIMEN:  none  DISPOSITION OF SPECIMEN:  N/A  FINDINGS: very short IUD string noted with IUD removal, significant cervical stenosis noted  DESCRIPTION OF OPERATION: Patient was taken to the operating room.  She is placed in the supine position. SCDs were on her lower extremities and functioning properly. General anesthesia with an LMA was administered without difficulty. Dr. Verita Lamb, anesthesia, oversaw case.  Legs were then placed in the [place] in the low lithotomy position. The legs were lifted to the high lithotomy position and the Betadine prep was used on the inner thighs perineum and vagina x3. Patient was draped in a normal standard fashion. No I&O cath was performed as intraop ultrasound was planned.  A bivalve speculum was placed the vagina. The anterior lip of the cervix was grasped with single-tooth tenaculum.  A paracervical block of 1% lidocaine mixed one-to-one with epinephrine (1:100,000 units).  10 cc was used total.  Using ultrasound guidance, endometrial canal was identified.  Cervix was dilated up to #19.  Using IUD hook, attempt made to remove IUD string through os.  Several attempts were made.  This was unsuccessful.  Decision was made to use hysteroscopy to visualize the IUD.  The cervix was dilated up to #21 [facility] dilators. The endometrial cavity sounded to 8 cm.   A 2.9 millimeter diagnostic hysteroscope was obtained. Normal saline was used as a hysteroscopic fluid. The hysteroscope was  advanced through the endocervical canal into the endometrial cavity. The base of the IUD and strings were noted.  Using a hysteroscopic grasper, the IUD string was grasped and IUD removed intact without difficulty.  The hysteroscopy was then used to visualize the endometrial cavity.  No abnormalities were noted.  Endometrium was thin.   At this point no other procedure was needed and this procedure was ended. The hysteroscope was removed. The fluid deficit was 170 cc. The tenaculum was removed from the anterior lip of the cervix. The speculum was removed from the vagina. The prep was cleansed of the patient's skin. The legs are positioned back in the supine position. Sponge, lap, needle, initially counts were correct x2. Patient was taken to recovery in stable condition.  COUNTS:  YES  PLAN OF CARE: Transfer to PACU

## 2021-02-16 NOTE — Transfer of Care (Signed)
Immediate Anesthesia Transfer of Care Note  Patient: Tracy Coleman  Procedure(s) Performed: HYSTEROSCOPY AND IUD REMOVAL (N/A Vagina ) OPERATIVE ULTRASOUND (N/A Abdomen)  Patient Location: PACU  Anesthesia Type:General  Level of Consciousness: awake, alert , oriented and patient cooperative  Airway & Oxygen Therapy: Patient Spontanous Breathing  Post-op Assessment: Report given to RN and Post -op Vital signs reviewed and stable  Post vital signs: Reviewed and stable  Last Vitals:  Vitals Value Taken Time  BP 156/74 02/16/21 1558  Temp    Pulse 100 02/16/21 1559  Resp 19 02/16/21 1559  SpO2 97 % 02/16/21 1559  Vitals shown include unvalidated device data.  Last Pain:  Vitals:   02/16/21 1243  TempSrc: Oral  PainSc: 10-Worst pain ever      Patients Stated Pain Goal: 5 (91/63/84 6659)  Complications: No complications documented.

## 2021-02-16 NOTE — Anesthesia Procedure Notes (Signed)
Procedure Name: LMA Insertion Date/Time: 02/16/2021 3:03 PM Performed by: Rogers Blocker, CRNA Pre-anesthesia Checklist: Patient identified, Emergency Drugs available, Suction available and Patient being monitored Patient Re-evaluated:Patient Re-evaluated prior to induction Oxygen Delivery Method: Circle System Utilized Preoxygenation: Pre-oxygenation with 100% oxygen Induction Type: IV induction Ventilation: Mask ventilation without difficulty LMA: LMA inserted LMA Size: 4.0 Number of attempts: 2 Placement Confirmation: positive ETCO2 Tube secured with: Tape Dental Injury: Teeth and Oropharynx as per pre-operative assessment

## 2021-02-16 NOTE — Discharge Instructions (Addendum)
Post-surgical Instructions, Outpatient Surgery  You may expect to feel dizzy, weak, and drowsy for as long as 24 hours after receiving the medicine that made you sleep (anesthetic). For the first 24 hours after your surgery:    Do not drive a car, ride a bicycle, participate in physical activities, or take public transportation until you are done taking narcotic pain medicines or as directed by Dr. Sabra Heck.   Do not drink alcohol or take tranquilizers.   Do not take medicine that has not been prescribed by your physicians.   Do not sign important papers or make important decisions while on narcotic pain medicines.   Have a responsible person with you.   PAIN MANAGEMENT  Motrin 800mg .  (This is the same as 4-200mg  over the counter tablets of Motrin or ibuprofen.)  You may take this every eight hours or as needed for cramping.    Vicodin 5/325mg .  For significant cramping, take one tablet every six hours as needed for pain control.  (Remember that narcotic pain medications increase your risk of constipation.  If this becomes a problem, you may take an over the counter stool softener like Colace 100mg  up to four times a day.)  DO'S AND DON'T'S  Do not take a tub bath for one week.  You may shower on the first day after your surgery  Do not do any heavy lifting for one to two weeks.  This increases the chance of bleeding.  Do move around as you feel able.  Stairs are fine.  You may begin to exercise again as you feel able.  Do not lift any weights for two weeks.  Do not put anything in the vagina for two weeks--no tampons, intercourse, or douching.    REGULAR MEDIATIONS/VITAMINS:  You may restart all of your regular medications as prescribed.  You may restart all of your vitamins as you normally take them.    PLEASE CALL OR SEEK MEDICAL CARE IF:  You have persistent nausea and vomiting.   You have trouble eating or drinking.   You have an oral temperature above 100.5.   You  have constipation that is not helped by adjusting diet or increasing fluid intake. Pain medicines are a common cause of constipation.   You have heavy vaginal bleeding   Post Anesthesia Home Care Instructions  Activity: Get plenty of rest for the remainder of the day. A responsible individual must stay with you for 24 hours following the procedure.  For the next 24 hours, DO NOT: -Drive a car -Paediatric nurse -Drink alcoholic beverages -Take any medication unless instructed by your physician -Make any legal decisions or sign important papers.  Meals: Start with liquid foods such as gelatin or soup. Progress to regular foods as tolerated. Avoid greasy, spicy, heavy foods. If nausea and/or vomiting occur, drink only clear liquids until the nausea and/or vomiting subsides. Call your physician if vomiting continues.  Special Instructions/Symptoms: Your throat may feel dry or sore from the anesthesia or the breathing tube placed in your throat during surgery. If this causes discomfort, gargle with warm salt water. The discomfort should disappear within 24 hours.

## 2021-02-16 NOTE — Progress Notes (Signed)
Patient upset because her surgery may be delayed and she had planned to have it started at 2 pm  Spoke with Dr Ola Spurr he did not think she should receive any medication for something that she is not here to be treated for on this visit. [place] and I offered to place her on a stretcher and apply  some heat that she uses at home   Patient accepted the heat not the stretcher..Dr. Sabra Heck informed.   Marcina Millard, rn

## 2021-02-17 ENCOUNTER — Encounter (HOSPITAL_BASED_OUTPATIENT_CLINIC_OR_DEPARTMENT_OTHER): Payer: Self-pay | Admitting: Obstetrics & Gynecology

## 2021-02-17 NOTE — Anesthesia Postprocedure Evaluation (Signed)
Anesthesia Post Note  Patient: [employer]  Procedure(s) Performed: HYSTEROSCOPY AND IUD REMOVAL (N/A Vagina ) OPERATIVE ULTRASOUND (N/A Abdomen)     Patient location during evaluation: PACU Anesthesia Type: General Level of consciousness: awake and alert Pain management: pain level controlled Vital Signs Assessment: post-procedure vital signs reviewed and stable Respiratory status: spontaneous breathing, nonlabored ventilation, respiratory function stable and patient connected to nasal cannula oxygen Cardiovascular status: blood pressure returned to baseline and stable Postop Assessment: no apparent nausea or vomiting Anesthetic complications: no   No complications documented.  Last Vitals:  Vitals:   02/16/21 1630 02/16/21 1700  BP: (!) 135/101 (!) 148/96  Pulse: 80 90  Resp: 20 20  Temp: 36.6 C 36.6 C  SpO2: 94% 100%    Last Pain:  Vitals:   02/16/21 1700  TempSrc:   PainSc: 10-Worst pain ever                 Tiajuana Amass

## 2021-03-09 ENCOUNTER — Encounter: Payer: Self-pay | Admitting: General Practice

## 2021-04-08 ENCOUNTER — Other Ambulatory Visit: Payer: Self-pay

## 2021-04-08 ENCOUNTER — Ambulatory Visit
Admission: RE | Admit: 2021-04-08 | Discharge: 2021-04-08 | Disposition: A | Discharge status: Home / Self Care | Payer: Medicaid Other | Source: Ambulatory Visit | Attending: Nurse Practitioner | Admitting: Nurse Practitioner

## 2021-04-08 DIAGNOSIS — Z1231 Encounter for screening mammogram for malignant neoplasm of breast: Secondary | ICD-10-CM

## 2021-10-02 ENCOUNTER — Other Ambulatory Visit: Payer: Self-pay

## 2021-10-02 ENCOUNTER — Emergency Department (HOSPITAL_COMMUNITY)
Admission: EM | Admit: 2021-10-02 | Discharge: 2021-10-02 | Disposition: A | Discharge status: Home / Self Care | Payer: Medicaid Other | Attending: Emergency Medicine | Admitting: Emergency Medicine

## 2021-10-02 ENCOUNTER — Emergency Department (HOSPITAL_COMMUNITY): Payer: Medicaid Other

## 2021-10-02 ENCOUNTER — Encounter (HOSPITAL_COMMUNITY): Payer: Self-pay | Admitting: Emergency Medicine

## 2021-10-02 DIAGNOSIS — Z8616 Personal history of COVID-19: Secondary | ICD-10-CM | POA: Insufficient documentation

## 2021-10-02 DIAGNOSIS — R0602 Shortness of breath: Secondary | ICD-10-CM | POA: Diagnosis present

## 2021-10-02 DIAGNOSIS — Z20822 Contact with and (suspected) exposure to covid-19: Secondary | ICD-10-CM | POA: Diagnosis not present

## 2021-10-02 DIAGNOSIS — I10 Essential (primary) hypertension: Secondary | ICD-10-CM | POA: Diagnosis not present

## 2021-10-02 DIAGNOSIS — J4521 Mild intermittent asthma with (acute) exacerbation: Secondary | ICD-10-CM | POA: Diagnosis not present

## 2021-10-02 DIAGNOSIS — F17210 Nicotine dependence, cigarettes, uncomplicated: Secondary | ICD-10-CM | POA: Diagnosis not present

## 2021-10-02 DIAGNOSIS — Z79899 Other long term (current) drug therapy: Secondary | ICD-10-CM | POA: Diagnosis not present

## 2021-10-02 DIAGNOSIS — Z720 Tobacco use: Secondary | ICD-10-CM

## 2021-10-02 LAB — CBC
HCT: 35.9 % — ABNORMAL LOW (ref 36.0–46.0)
Hemoglobin: 11.9 g/dL — ABNORMAL LOW (ref 12.0–15.0)
MCH: 31.5 pg (ref 26.0–34.0)
MCHC: 33.1 g/dL (ref 30.0–36.0)
MCV: 95 fL (ref 80.0–100.0)
Platelets: 364 10*3/uL (ref 150–400)
RBC: 3.78 MIL/uL — ABNORMAL LOW (ref 3.87–5.11)
RDW: 14.8 % (ref 11.5–15.5)
WBC: 11.4 10*3/uL — ABNORMAL HIGH (ref 4.0–10.5)
nRBC: 0.2 % (ref 0.0–0.2)

## 2021-10-02 LAB — I-STAT BETA HCG BLOOD, ED (MC, WL, AP ONLY): I-stat hCG, quantitative: <5 m[IU]/mL (ref <5)

## 2021-10-02 LAB — BASIC METABOLIC PANEL
Anion gap: 10 (ref 5–15)
BUN: 8 mg/dL (ref 6–20)
CO2: 21 mmol/L — ABNORMAL LOW (ref 22–32)
Calcium: 9 mg/dL (ref 8.9–10.3)
Chloride: 107 mmol/L (ref 98–111)
Creatinine, Ser: 0.76 mg/dL (ref 0.44–1.00)
GFR, Estimated: >60 mL/min (ref >60)
Glucose, Bld: 142 mg/dL — ABNORMAL HIGH (ref 70–99)
Potassium: 3.7 mmol/L (ref 3.5–5.1)
Sodium: 138 mmol/L (ref 135–145)

## 2021-10-02 LAB — TROPONIN I (HIGH SENSITIVITY): Troponin I (High Sensitivity): 6 ng/L (ref <18)

## 2021-10-02 LAB — RESP PANEL BY RT-PCR (FLU A&B, COVID) ARPGX2
Influenza A by PCR: NEGATIVE
Influenza B by PCR: NEGATIVE
SARS Coronavirus 2 by RT PCR: NEGATIVE

## 2021-10-02 MED ORDER — ALBUTEROL SULFATE HFA 108 (90 BASE) MCG/ACT IN AERS
2.0000 | INHALATION_SPRAY | Freq: Once | RESPIRATORY_TRACT | Status: AC
  Administered 2021-10-02: 2 via RESPIRATORY_TRACT
  Filled 2021-10-02: qty 6.7

## 2021-10-02 MED ORDER — IPRATROPIUM-ALBUTEROL 0.5-2.5 (3) MG/3ML IN SOLN
3.0000 mL | Freq: Once | RESPIRATORY_TRACT | Status: AC
  Administered 2021-10-02: 3 mL via RESPIRATORY_TRACT
  Filled 2021-10-02: qty 3

## 2021-10-02 MED ORDER — AEROCHAMBER PLUS FLO-VU LARGE MISC
1.0000 | Freq: Once | Status: AC
  Administered 2021-10-02: 1

## 2021-10-02 MED ORDER — PREDNISONE 10 MG (21) PO TBPK: ORAL_TABLET | Freq: Every day | ORAL | 0 refills | Status: DC

## 2021-10-02 MED ORDER — ALBUTEROL SULFATE HFA 108 (90 BASE) MCG/ACT IN AERS
2.0000 | INHALATION_SPRAY | RESPIRATORY_TRACT | Status: DC | PRN
  Administered 2021-10-02: 2 via RESPIRATORY_TRACT
  Filled 2021-10-02: qty 6.7

## 2021-10-02 MED ORDER — ALBUTEROL SULFATE (2.5 MG/3ML) 0.083% IN NEBU: 2.5000 mg | INHALATION_SOLUTION | Freq: Four times a day (QID) | RESPIRATORY_TRACT | 0 refills | Status: DC | PRN

## 2021-10-02 MED ORDER — METHYLPREDNISOLONE SODIUM SUCC 125 MG IJ SOLR
125.0000 mg | Freq: Once | INTRAMUSCULAR | Status: AC
  Administered 2021-10-02: 125 mg via INTRAVENOUS
  Filled 2021-10-02: qty 2

## 2021-10-02 MED ORDER — ALBUTEROL SULFATE HFA 108 (90 BASE) MCG/ACT IN AERS: 2.0000 | INHALATION_SPRAY | RESPIRATORY_TRACT | 0 refills | Status: DC | PRN

## 2021-10-02 NOTE — Discharge Instructions (Addendum)
Try to stop smoking. °

## 2021-10-02 NOTE — ED Notes (Addendum)
Pt is c/o chest pain. 8/10. Pt states it is in the center of her chest.  Triage RN and PA are aware.

## 2021-10-02 NOTE — ED Provider Notes (Signed)
[facility] EMERGENCY DEPARTMENT Provider Note   CSN: [id number] Arrival date & time: 10/02/21  7253     History Chief Complaint  Patient presents with   Asthma    Tracy Coleman is a 45 y.o. female.  Pt presents to the ED today with sob.  She has had sob for the past few days.  She has a hx of asthma and is out of her inhaler.  She also has some cp, but feels that is from breathing heavily.  Pt denies any f/c.  She does smoke.      Past Medical History:  Diagnosis Date   Anxiety    Arthritis    feet oa   Asthma    COVID 07/2020   loss of smell headache body aches x 7 days all symptoms resolved   Depression    Dyspnea    with exertion   GERD (gastroesophageal reflux disease)    Hypertension    Obesity    PTSD (post-traumatic stress disorder)    Wears glasses     Patient Active Problem List   Diagnosis Date Noted   History of cervical dysplasia 01/13/2021   Tobacco dependence with current use 11/02/2020   Allergies 05/13/2019   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   Closed fracture of navicular bone of foot 02/07/2019   Atypical chest pain 02/06/2019   Asthma 02/06/2019   Cervical stenosis (uterine cervix) 01/29/2019   IUD strings lost 01/29/2019   MDD (major depressive disorder), severe ([place]) 01/03/2019   Contraception management 12/23/2018   Pain in joint of right ankle 10/18/2018   Acquired hallux rigidus 10/18/2018   Arthritis of joint of toe 03/06/2018   Essential hypertension 05/03/2013   Obesity 05/03/2013   Anxiety 05/03/2013   Depressive disorder 05/03/2013    Past Surgical History:  Procedure Laterality Date   ARTHRODESIS METATARSALPHALANGEAL JOINT (MTPJ) Right 04/11/2018   Procedure: ARTHRODESIS METATARSALPHALANGEAL JOINT (MTPJ);  Surgeon: Wylene Simmer, MD;  Location: [facility];  Service: Orthopedics;  Laterality: Right;   FOOT ARTHRODESIS Right 01/23/2019   Procedure: Open treatment of right navicular  nonunion;  Surgeon: Wylene Simmer, MD;  Location: [facility];  Service: Orthopedics;  Laterality: Right;   HYSTEROSCOPY WITH D & C N/A 02/16/2021   Procedure: HYSTEROSCOPY AND IUD REMOVAL;  Surgeon: Megan Salon, MD;  Location[facility];  Service: Gynecology;  Laterality: N/A;   LEEP     x2. Last one 2013   OPERATIVE ULTRASOUND N/A 02/16/2021   Procedure: OPERATIVE ULTRASOUND;  Surgeon: Megan Salon, MD;  Location: [facility]  Service: Gynecology;  Laterality: N/A;     OB History     Gravida  2   Para  1   Term  1   Preterm      AB  1   Living  1      SAB  1   IAB      Ectopic      Multiple      Live Births  1           Family History  Problem Relation Age of Onset   Alcohol abuse Mother    Drug abuse Mother    Depression Mother    Early death Mother        pneumonia, age 75   Hypertension Mother    Alcohol abuse Father    Drug abuse Father    Diabetes Maternal Grandmother  Hypertension Maternal Grandmother    Diabetes Paternal Grandmother    Hypertension Paternal Grandmother     Social History   Tobacco Use   Smoking status: Every Day    Packs/day: 0.50    Years: 30.00    Pack years: 15.00    Types: Cigarettes   Smokeless tobacco: Never  Vaping Use   Vaping Use: Never used  Substance Use Topics   Alcohol use: Yes    Comment: occasional 2-3 drinks/week   Drug use: Not Currently    Comment: 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 Authorizing Provider  predniSONE (STERAPRED UNI-PAK 21 TAB) 10 MG (21) TBPK tablet Take by mouth daily. Take 6 tabs by mouth daily  for 2 days, then 5 tabs for 2 days, then 4 tabs for 2 days, then 3 tabs for 2 days, 2 tabs for 2 days, then 1 tab by mouth daily for 2 days 10/02/21  Yes Isla Pence, MD  albuterol (PROVENTIL) (2.5 MG/3ML) 0.083%  nebulizer solution Take 3 mLs (2.5 mg total) by nebulization every 6 (six) hours as needed for wheezing or shortness of breath. 10/02/21   Isla Pence, MD  albuterol (VENTOLIN HFA) 108 (90 Base) MCG/ACT inhaler Inhale 2 puffs into the lungs every 4 (four) hours as needed for wheezing or shortness of breath. 10/02/21   Isla Pence, MD  amLODipine (NORVASC) 5 MG tablet Take 1 tablet (5 mg total) by mouth daily. 11/02/20   Zola Button, MD  busPIRone (BUSPAR) 10 MG tablet Take 10 mg by mouth 2 (two) times daily.    [provider]  cetirizine (ZYRTEC ALLERGY) 10 MG tablet Take 1 tablet (10 mg total) by mouth daily. 08/07/20   Hall-Potvin, [place], PA-C  chlorthalidone (HYGROTON) 25 MG tablet Take 1 tablet (25 mg total) by mouth daily. 11/02/20   Zola Button, MD  desvenlafaxine (PRISTIQ) 100 MG 24 hr tablet Take 100 mg by mouth daily.    [provider]  fluticasone (FLONASE) 50 MCG/ACT nasal spray Place 1 spray into both nostrils daily. 11/02/20   Zola Button, MD  HYDROcodone-acetaminophen (NORCO/VICODIN) 5-325 MG tablet Take 1 tablet by mouth every 6 (six) hours as needed for moderate pain or severe pain (cramping). 02/16/21   Megan Salon, MD  hydrOXYzine (VISTARIL) 50 MG capsule hydroxyzine pamoate 50 mg capsule  TAKE 1 CAPSULE BY MOUTH AT BEDTIME    [provider]  ibuprofen (ADVIL) 800 MG tablet Take 1 tablet (800 mg total) by mouth every 8 (eight) hours as needed. 02/16/21   Megan Salon, MD  lisinopril (ZESTRIL) 40 MG tablet Take 1 tablet (40 mg total) by mouth daily. 11/02/20   Zola Button, MD  traZODone (DESYREL) 100 MG tablet trazodone 100 mg tablet  TAKE 2 TABLETS BY MOUTH AT BEDTIME    [provider]    Allergies    Varenicline  Review of Systems   Review of Systems  Respiratory:  Positive for shortness of breath and wheezing.   Cardiovascular:  Positive for chest pain.  All other systems reviewed and are negative.  Physical  Exam Updated Vital Signs BP (!) 177/108 (BP Location: Right Arm)   Pulse 61   Temp 98.5 F (36.9 C) (Oral)   Resp (!) 21   SpO2 97%   Physical Exam Vitals and nursing note reviewed.  Constitutional:  Appearance: Normal appearance. She is obese.  HENT:     Head: Normocephalic and atraumatic.     Right Ear: External ear normal.     Left Ear: External ear normal.     Nose: Nose normal.     Mouth/Throat:     Mouth: Mucous membranes are dry.  Eyes:     Extraocular Movements: Extraocular movements intact.     Conjunctiva/sclera: Conjunctivae normal.     Pupils: Pupils are equal, round, and reactive to light.  Cardiovascular:     Rate and Rhythm: Normal rate and regular rhythm.     Pulses: Normal pulses.     Heart sounds: Normal heart sounds.  Pulmonary:     Effort: Pulmonary effort is normal.     Breath sounds: Wheezing present.  Abdominal:     General: Abdomen is flat. Bowel sounds are normal.     Palpations: Abdomen is soft.  Musculoskeletal:        General: Normal range of motion.     Cervical back: Normal range of motion and neck supple.  Skin:    General: Skin is warm.     Capillary Refill: Capillary refill takes less than 2 seconds.  Neurological:     General: No focal deficit present.     Mental Status: She is alert and oriented to person, place, and time.  Psychiatric:        Mood and Affect: Mood normal.        Behavior: Behavior normal.    ED Results / Procedures / Treatments   Labs (all labs ordered are listed, but only abnormal results are displayed) Labs Reviewed  BASIC METABOLIC PANEL - Abnormal; Notable for the following components:      Result Value   CO2 21 (*)    Glucose, Bld 142 (*)    All other components within normal limits  CBC - Abnormal; Notable for the following components:   WBC 11.4 (*)    RBC 3.78 (*)    Hemoglobin 11.9 (*)    HCT 35.9 (*)    All other components within normal limits  RESP PANEL BY RT-PCR (FLU A&B, COVID)  ARPGX2  I-STAT BETA HCG BLOOD, ED ([facility], [facility], [facility] ONLY)  TROPONIN I (HIGH SENSITIVITY)    EKG EKG Interpretation  Date/Time:  Sunday October 02 2021 10:05:28 EDT Ventricular Rate:  60 PR Interval:  214 QRS Duration: 76 QT Interval:  434 QTC Calculation: 434 R Axis:   -16 Text Interpretation: Sinus rhythm with sinus arrhythmia with 1st degree A-V block Otherwise normal ECG Since last tracing rate slower Confirmed by Isla Pence [phone]) on 10/02/2021 11:54:11 AM  Radiology DG Chest 2 View  Result Date: 10/02/2021 CLINICAL DATA:  Chest pain, asthma flare up for 24 hours. EXAM: CHEST - 2 VIEW COMPARISON:  Chest radiograph dated 02/03/2019. FINDINGS: The heart is borderline enlarged. Both lungs are clear. The osseous structures are unremarkable. IMPRESSION: No active cardiopulmonary disease. Electronically Signed   By: Zerita Boers M.D.   On: 10/02/2021 10:48    Procedures Procedures   Medications Ordered in ED Medications  albuterol (VENTOLIN HFA) 108 (90 Base) MCG/ACT inhaler 2 puff (2 puffs Inhalation Given 10/02/21 0728)  ipratropium-albuterol (DUONEB) 0.5-2.5 (3) MG/3ML nebulizer solution 3 mL (3 mLs Nebulization Given 10/02/21 1149)  methylPREDNISolone sodium succinate (SOLU-MEDROL) 125 mg/2 mL injection 125 mg (125 mg Intravenous Given 10/02/21 1156)  ipratropium-albuterol (DUONEB) 0.5-2.5 (3) MG/3ML nebulizer solution 3 mL (3 mLs Nebulization Given 10/02/21 1150)  albuterol (  VENTOLIN HFA) 108 (90 Base) MCG/ACT inhaler 2 puff (2 puffs Inhalation Given 10/02/21 1227)  AeroChamber Plus Flo-Vu Large MISC 1 each (1 each Other Given 10/02/21 1233)    ED Course  I have reviewed the triage vital signs and the nursing notes.  Pertinent labs & imaging results that were available during my care of the patient were reviewed by me and considered in my medical decision making (see chart for details).    MDM Rules/Calculators/A&P                           Pt is feeling much better  after treatment.  Covid/flu neg.  She feels like she is ready to go home.  Pt is given a refill on her albuterol nebs and inhaler.  She is d/c with prednisone.  She is encouraged to stop smoking.  Her bp is elevated, but she has not taken her bp meds today.  She is told to take them when she gets home.  She is to return if worse.  F/u with pcp.  Trenise Turay was evaluated in Emergency Department on 10/02/2021 for the symptoms described in the history of present illness. She was evaluated in the context of the global COVID-19 pandemic, which necessitated consideration that the patient might be at risk for infection with the SARS-CoV-2 virus that causes COVID-19. Institutional protocols and algorithms that pertain to the evaluation of patients at risk for COVID-19 are in a state of rapid change based on information released by regulatory bodies including the CDC and federal and state organizations. These policies and algorithms were followed during the patient's care in the ED.  Final Clinical Impression(s) / ED Diagnoses Final diagnoses:  Mild intermittent asthma with exacerbation  Tobacco abuse    Rx / DC Orders ED Discharge Orders          Ordered    albuterol (PROVENTIL) (2.5 MG/3ML) 0.083% nebulizer solution  Every 6 hours PRN        10/02/21 1309    albuterol (VENTOLIN HFA) 108 (90 Base) MCG/ACT inhaler  Every 4 hours PRN        10/02/21 1309    predniSONE (STERAPRED UNI-PAK 21 TAB) 10 MG (21) TBPK tablet  Daily        10/02/21 1309             Isla Pence, MD 10/02/21 1316

## 2021-10-02 NOTE — ED Provider Notes (Signed)
Emergency Medicine Provider Triage Evaluation Note  Tracy Coleman , a 45 y.o. female  was evaluated in triage.  Pt complains of redness of breath and chest pressure for the past 12 hours.  Patient was she is a history of asthma and has been out of her albuterol inhaler.  She has been experiencing shortness of breath and wheezing for the past few weeks.   I initially saw this patient after she had already been triaged by nursing staff for reevaluation after mentioning chest pain.  Review of Systems  Positive: Shortness of breath, chest pain, wheezing Negative: Nausea, vomiting, fevers, diaphoresis  Physical Exam  BP (!) 177/108 (BP Location: Right Arm)   Pulse 61   Temp 98.5 F (36.9 C) (Oral)   Resp (!) 21   SpO2 97%  Gen:   Awake, no distress   Resp:  Normal effort  MSK:   Moves extremities without difficulty  Other:  CTAB.  Borderline bradycardia.  Medical Decision Making  Medically screening exam initiated at 10:17 AM.  Appropriate orders placed.  Tracy Coleman was informed that the remainder of the evaluation will be completed by another provider, this initial triage assessment does not replace that evaluation, and the importance of remaining in the ED until their evaluation is complete.  Patient mentions she discussed only the shortness of breath with nursing staff originally, but did not mention the chest pain.  Reports chest pains been going on for the past 12 hours and feels pressure-like in nature, centrally located.  Denies any radiation.  Chest pain order set.   Sherrell Puller, PA-C 10/02/21 1020    Luna Fuse, MD 10/02/21 1504

## 2021-10-02 NOTE — ED Triage Notes (Signed)
Pt reports asthma flare-up x 24 hours.  States she is out of her inhaler.

## 2021-12-20 DIAGNOSIS — F331 Major depressive disorder, recurrent, moderate: Secondary | ICD-10-CM | POA: Diagnosis not present

## 2021-12-20 DIAGNOSIS — F411 Generalized anxiety disorder: Secondary | ICD-10-CM | POA: Diagnosis not present

## 2021-12-20 DIAGNOSIS — F4312 Post-traumatic stress disorder, chronic: Secondary | ICD-10-CM | POA: Diagnosis not present

## 2021-12-20 DIAGNOSIS — F1220 Cannabis dependence, uncomplicated: Secondary | ICD-10-CM | POA: Diagnosis not present

## 2021-12-20 DIAGNOSIS — F1020 Alcohol dependence, uncomplicated: Secondary | ICD-10-CM | POA: Diagnosis not present

## 2022-01-28 ENCOUNTER — Other Ambulatory Visit: Payer: Self-pay | Admitting: Family Medicine

## 2022-03-14 ENCOUNTER — Encounter (HOSPITAL_COMMUNITY): Payer: Self-pay

## 2022-03-14 ENCOUNTER — Emergency Department (HOSPITAL_COMMUNITY): Payer: Medicaid Other

## 2022-03-14 ENCOUNTER — Other Ambulatory Visit: Payer: Self-pay

## 2022-03-14 ENCOUNTER — Emergency Department (HOSPITAL_COMMUNITY)
Admission: EM | Admit: 2022-03-14 | Discharge: 2022-03-14 | Disposition: A | Discharge status: Home / Self Care | Payer: Medicaid Other | Attending: Emergency Medicine | Admitting: Emergency Medicine

## 2022-03-14 DIAGNOSIS — J4541 Moderate persistent asthma with (acute) exacerbation: Secondary | ICD-10-CM | POA: Diagnosis not present

## 2022-03-14 DIAGNOSIS — E119 Type 2 diabetes mellitus without complications: Secondary | ICD-10-CM | POA: Insufficient documentation

## 2022-03-14 DIAGNOSIS — Z7951 Long term (current) use of inhaled steroids: Secondary | ICD-10-CM | POA: Insufficient documentation

## 2022-03-14 DIAGNOSIS — R0789 Other chest pain: Secondary | ICD-10-CM | POA: Diagnosis not present

## 2022-03-14 DIAGNOSIS — I10 Essential (primary) hypertension: Secondary | ICD-10-CM | POA: Diagnosis not present

## 2022-03-14 DIAGNOSIS — Z79899 Other long term (current) drug therapy: Secondary | ICD-10-CM | POA: Diagnosis not present

## 2022-03-14 DIAGNOSIS — Z87891 Personal history of nicotine dependence: Secondary | ICD-10-CM | POA: Insufficient documentation

## 2022-03-14 DIAGNOSIS — R079 Chest pain, unspecified: Secondary | ICD-10-CM | POA: Diagnosis not present

## 2022-03-14 DIAGNOSIS — I517 Cardiomegaly: Secondary | ICD-10-CM | POA: Diagnosis not present

## 2022-03-14 LAB — BASIC METABOLIC PANEL
Anion gap: 8 (ref 5–15)
BUN: 8 mg/dL (ref 6–20)
CO2: 28 mmol/L (ref 22–32)
Calcium: 8.6 mg/dL — ABNORMAL LOW (ref 8.9–10.3)
Chloride: 106 mmol/L (ref 98–111)
Creatinine, Ser: 0.84 mg/dL (ref 0.44–1.00)
GFR, Estimated: >60 mL/min (ref >60)
Glucose, Bld: 120 mg/dL — ABNORMAL HIGH (ref 70–99)
Potassium: 3.6 mmol/L (ref 3.5–5.1)
Sodium: 142 mmol/L (ref 135–145)

## 2022-03-14 LAB — CBC
HCT: 39.1 % (ref 36.0–46.0)
Hemoglobin: 13.5 g/dL (ref 12.0–15.0)
MCH: 32.1 pg (ref 26.0–34.0)
MCHC: 34.5 g/dL (ref 30.0–36.0)
MCV: 92.9 fL (ref 80.0–100.0)
Platelets: 350 10*3/uL (ref 150–400)
RBC: 4.21 MIL/uL (ref 3.87–5.11)
RDW: 14.3 % (ref 11.5–15.5)
WBC: 7.8 10*3/uL (ref 4.0–10.5)
nRBC: 0 % (ref 0.0–0.2)

## 2022-03-14 LAB — I-STAT BETA HCG BLOOD, ED (MC, WL, AP ONLY): I-stat hCG, quantitative: <5 m[IU]/mL (ref <5)

## 2022-03-14 LAB — TROPONIN I (HIGH SENSITIVITY): Troponin I (High Sensitivity): 6 ng/L (ref <18)

## 2022-03-14 MED ORDER — AMLODIPINE BESYLATE 5 MG PO TABS: 5.0000 mg | ORAL_TABLET | Freq: Every day | ORAL | 0 refills | Status: DC

## 2022-03-14 MED ORDER — PREDNISONE 20 MG PO TABS
60.0000 mg | ORAL_TABLET | Freq: Once | ORAL | Status: AC
  Administered 2022-03-14: 60 mg via ORAL
  Filled 2022-03-14: qty 3

## 2022-03-14 MED ORDER — CHLORTHALIDONE 25 MG PO TABS: 25.0000 mg | ORAL_TABLET | Freq: Every day | ORAL | 0 refills | Status: DC

## 2022-03-14 MED ORDER — HYDROXYZINE HCL 25 MG PO TABS
25.0000 mg | ORAL_TABLET | Freq: Once | ORAL | Status: AC
  Administered 2022-03-14: 25 mg via ORAL
  Filled 2022-03-14: qty 1

## 2022-03-14 MED ORDER — IPRATROPIUM-ALBUTEROL 0.5-2.5 (3) MG/3ML IN SOLN
3.0000 mL | Freq: Once | RESPIRATORY_TRACT | Status: AC
  Administered 2022-03-14: 3 mL via RESPIRATORY_TRACT
  Filled 2022-03-14: qty 3

## 2022-03-14 MED ORDER — PREDNISONE 10 MG PO TABS: 50.0000 mg | ORAL_TABLET | Freq: Every day | ORAL | 0 refills | Status: AC

## 2022-03-14 MED ORDER — LISINOPRIL 40 MG PO TABS: 40.0000 mg | ORAL_TABLET | Freq: Every day | ORAL | 0 refills | Status: DC

## 2022-03-14 NOTE — ED Triage Notes (Signed)
Pt arrived POV from home c/o centralized CP that radiates across and down the left arm. Pt states it started an hr ago. The pain is 6/10. Pt endorses some SHOB as well.  ?

## 2022-03-14 NOTE — ED Provider Notes (Signed)
?  Physical Exam  ?BP 120/82   Pulse 67   Temp 98.6 ?F (37 ?C) (Oral)   Resp 16   Ht '5\' 5"'$  (1.651 m)   Wt 116.6 kg   SpO2 100%   BMI 42.77 kg/m?  ? ?Physical Exam ?Vitals and nursing note reviewed.  ?Constitutional:   ?   General: She is not in acute distress. ?   Appearance: She is well-developed.  ?HENT:  ?   Head: Normocephalic and atraumatic.  ?Eyes:  ?   Conjunctiva/sclera: Conjunctivae normal.  ?Cardiovascular:  ?   Rate and Rhythm: Normal rate and regular rhythm.  ?   Heart sounds: No murmur heard. ?Pulmonary:  ?   Effort: Pulmonary effort is normal. No respiratory distress.  ?   Breath sounds: Normal breath sounds.  ?Abdominal:  ?   Palpations: Abdomen is soft.  ?   Tenderness: There is no abdominal tenderness.  ?Musculoskeletal:     ?   General: No swelling.  ?   Cervical back: Neck supple.  ?Skin: ?   General: Skin is warm and dry.  ?   Capillary Refill: Capillary refill takes less than 2 seconds.  ?Neurological:  ?   Mental Status: She is alert.  ?Psychiatric:     ?   Mood and Affect: Mood normal.  ? ? ?Procedures  ?Procedures ? ?ED Course / MDM  ? ?Clinical Course as of 03/14/22 1542  ?Tue Mar 14, 2022  ?1520 Signed out to Dr Matilde Sprang EDP pending 2nd trop & reassessment after nebs [MT]  ?  ?Clinical Course User Index ?[MT] Wyvonnia Dusky, MD  ? ?Medical Decision Making ?Amount and/or Complexity of Data Reviewed ?Labs: ordered. ?Radiology: ordered. ? ?Risk ?Prescription drug management. ? ? ?Patient received in handoff.  Chest pain with asthma exacerbation.  Initial high-sensitivity troponin negative with a value of 6.  Attempted to obtain delta troponin as this was the plan at signout but patient is demanding to leave the emergency department.  She endorses no persistent chest pain and her evaluation has no persistent wheezing.  Patient then discharged with strict return precautions and outpatient follow-up. ? ? ? ? ?  ?Teressa Lower, MD ?03/14/22 1544 ? ?

## 2022-03-14 NOTE — ED Notes (Signed)
Patient verbalizes understanding of discharge instructions. Opportunity for questioning and answers were provided. Armband removed by staff, pt discharged from ED.  

## 2022-03-14 NOTE — Discharge Instructions (Addendum)
You were seen in the ER for an episode of chest tightness, found to be wheezing.  It is possible you are experiencing an asthma exacerbation.  I recommend for the next 2 days you give yourself a nebulizer breathing treatment at home every 4 hours while awake.  You will also take your next dose of prednisone steroids tomorrow morning. ? ?The rest of your work-up was reassuring did not show signs of a heart attack, anemia, or other life-threatening emergency, such as pneumonia.  If you continue having persistent chest pressure, I provided you a phone number for the heart care clinic, and you can schedule a follow-up appointment or an office evaluation with a cardiologist.  Your risk factors for heart disease would include smoking and high blood pressure.  It is very important that you stop smoking and that you take your blood pressure medications that are prescribed at home. ?

## 2022-03-14 NOTE — ED Provider Notes (Signed)
?[facility] ?Provider Note ? ? ?CSN: [id number] ?Arrival date & time: 03/14/22  1104 ? ?  ? ?History ? ?Chief Complaint  ?Patient presents with  ? Chest Pain  ? ? ?Tracy Coleman is a 46 y.o. female with history of hypertension, smoking, asthma, presenting to ED with chest pressure and tightness.  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  She says she has a similar symptoms in the past that she associated with "panic attacks".  However this felt more intense.  She did feel some shortness of breath as well. ? ?She smokes about half a pack to three quarters of a pack a day.  She does have a history of asthma and has albuterol inhaler with spacer as well as nebulizer machine but has not used it at home recently. ? ?She reports has a history of hypertension and has been "out of all my medications" for several days.  She needs refills of her 3 antihypertensive medicines. ? ?She denies history of diabetes, high cholesterol, significant family history of MI, or any history of angina or exertional chest pressure or pain. ? ?HPI ? ?  ? ?Home Medications ?Prior to Admission medications   ?Medication Sig Start Date End Date Taking? Authorizing Provider  ?predniSONE (DELTASONE) 10 MG tablet Take 5 tablets (50 mg total) by mouth daily with breakfast for 4 days. 03/15/22 03/19/22 Yes Yexalen Deike, Carola Rhine, MD  ?albuterol (PROVENTIL) (2.5 MG/3ML) 0.083% nebulizer solution Take 3 mLs (2.5 mg total) by nebulization every 6 (six) hours as needed for wheezing or shortness of breath. 10/02/21   Isla Pence, MD  ?albuterol (VENTOLIN HFA) 108 (90 Base) MCG/ACT inhaler Inhale 2 puffs into the lungs every 4 (four) hours as needed for wheezing or shortness of breath. 10/02/21   Isla Pence, MD  ?amLODipine (NORVASC) 5 MG tablet Take 1 tablet (5 mg total) by mouth daily. 03/14/22  05/13/22  Wyvonnia Dusky, MD  ?busPIRone (BUSPAR) 10 MG tablet Take 10 mg by mouth 2 (two) times daily.    [provider]  ?cetirizine (ZYRTEC ALLERGY) 10 MG tablet Take 1 tablet (10 mg total) by mouth daily. 08/07/20   Hall-Potvin, [place], PA-C  ?chlorthalidone (HYGROTON) 25 MG tablet Take 1 tablet (25 mg total) by mouth daily. 03/14/22 05/13/22  Wyvonnia Dusky, MD  ?desvenlafaxine (PRISTIQ) 100 MG 24 hr tablet Take 100 mg by mouth daily.    [provider]  ?fluticasone (FLONASE) 50 MCG/ACT nasal spray Place 1 spray into both nostrils daily. 11/02/20   Zola Button, MD  ?HYDROcodone-acetaminophen (NORCO/VICODIN) 5-325 MG tablet Take 1 tablet by mouth every 6 (six) hours as needed for moderate pain or severe pain (cramping). 02/16/21   Megan Salon, MD  ?hydrOXYzine (VISTARIL) 50 MG capsule hydroxyzine pamoate 50 mg capsule ? TAKE 1 CAPSULE BY MOUTH AT BEDTIME    [provider]  ?ibuprofen (ADVIL) 800 MG tablet Take 1 tablet (800 mg total) by mouth every 8 (eight) hours as needed. 02/16/21   Megan Salon, MD  ?lisinopril (ZESTRIL) 40 MG tablet Take 1 tablet (40 mg total) by mouth daily. 03/14/22 05/13/22  Wyvonnia Dusky, MD  ?predniSONE (STERAPRED UNI-PAK 21 TAB) 10 MG (21) TBPK tablet Take by mouth daily. Take 6 tabs by mouth daily  for 2 days, then 5 tabs for 2 days, then  4 tabs for 2 days, then 3 tabs for 2 days, 2 tabs for 2 days, then 1 tab by mouth daily for 2 days 10/02/21   Isla Pence, MD  ?traZODone (DESYREL) 100 MG tablet trazodone 100 mg tablet ? TAKE 2 TABLETS BY MOUTH AT BEDTIME    [provider]  ?   ? ?Allergies    ?Varenicline   ? ?Review of Systems   ?Review of Systems ? ?Physical Exam ?Updated Vital Signs ?BP 120/82   Pulse 67   Temp 98.6 ?F (37 ?C) (Oral)   Resp 16   Ht '5\' 5"'$  (1.651 m)   Wt 116.6 kg   SpO2 100%   BMI 42.77 kg/m?  ?Physical Exam ?Constitutional:   ?   General: She is not in acute distress. ?HENT:  ?   Head: Normocephalic and  atraumatic.  ?Eyes:  ?   Conjunctiva/sclera: Conjunctivae normal.  ?   Pupils: Pupils are equal, round, and reactive to light.  ?Cardiovascular:  ?   Rate and Rhythm: Normal rate and regular rhythm.  ?Pulmonary:  ?   Effort: Pulmonary effort is normal. No respiratory distress.  ?   Comments: Wheezing on exam ?Abdominal:  ?   General: There is no distension.  ?   Tenderness: There is no abdominal tenderness.  ?Skin: ?   General: Skin is warm and dry.  ?Neurological:  ?   General: No focal deficit present.  ?   Mental Status: She is alert. Mental status is at baseline.  ?Psychiatric:     ?   Mood and Affect: Mood normal.     ?   Behavior: Behavior normal.  ? ? ?ED Results / Procedures / Treatments   ?Labs ?(all labs ordered are listed, but only abnormal results are displayed) ?Labs Reviewed  ?BASIC METABOLIC PANEL - Abnormal; Notable for the following components:  ?    Result Value  ? Glucose, Bld 120 (*)   ? Calcium 8.6 (*)   ? All other components within normal limits  ?CBC  ?I-STAT BETA HCG BLOOD, ED ([facility], [facility], [facility] ONLY)  ?TROPONIN I (HIGH SENSITIVITY)  ?TROPONIN I (HIGH SENSITIVITY)  ? ? ?EKG ?EKG Interpretation ? ?Date/Time:  Tuesday March 14 2022 11:11:41 EDT ?Ventricular Rate:  86 ?PR Interval:  188 ?QRS Duration: 84 ?QT Interval:  366 ?QTC Calculation: 437 ?R Axis:   -20 ?Text Interpretation: Normal sinus rhythm Normal ECG When compared with ECG of 02-Oct-2021 10:05, PREVIOUS ECG IS PRESENT Confirmed by Octaviano Glow [phone]) on 03/14/2022 2:50:34 PM ? ?Radiology ?DG Chest 2 View ? ?Result Date: 03/14/2022 ?CLINICAL DATA:  Chest pain EXAM: CHEST - 2 VIEW COMPARISON:  Chest x-ray dated October 02, 2021 FINDINGS: Unchanged cardiomegaly. Both lungs are clear. The visualized skeletal structures are unremarkable. IMPRESSION: No active cardiopulmonary disease. Electronically Signed   By: Yetta Glassman M.D.   On: 03/14/2022 11:50   ? ?Procedures ?Procedures  ? ? ?Medications Ordered in ED ?Medications   ?ipratropium-albuterol (DUONEB) 0.5-2.5 (3) MG/3ML nebulizer solution 3 mL (3 mLs Nebulization Given 03/14/22 1506)  ?predniSONE (DELTASONE) tablet 60 mg (60 mg Oral Given 03/14/22 1505)  ?hydrOXYzine (ATARAX) tablet 25 mg (25 mg Oral Given 03/14/22 1505)  ? ? ?ED Course/ Medical Decision Making/ A&P ?Clinical Course as of 03/14/22 1521  ?Tue Mar 14, 2022  ?1520 Signed out to Dr Matilde Sprang EDP pending 2nd trop & reassessment after nebs [MT]  ?  ?Clinical Course User Index ?[MT] Wyvonnia Dusky, MD  ? ?                        ?  Medical Decision Making ?Amount and/or Complexity of Data Reviewed ?Labs: ordered. ?Radiology: ordered. ? ?Risk ?Prescription drug management. ? ? ?This patient presents to the ED with concern for chest tightness, wheezing. This involves an extensive number of treatment options, and is a complaint that carries with it a high risk of complications and morbidity.  The differential diagnosis includes asthma exacerbation most likely versus atypical ACS versus pneumonia versus other ? ?Co-morbidities that complicate the patient evaluation: Cardiac risk factors include hypertension and smoking.  Patient counseled on the portance of maintaining her blood pressure medication regimen and also smoking cessation. ? ?Supplemental history provided by the patient's husband at bedside ? ? ?I ordered and personally interpreted labs.  The pertinent results include: No acute anemia, no evidence of dehydration, no leukocytosis, pregnancy negative.  Initial troponin is 6.  Repeat troponin pending ? ?I ordered imaging studies including x-ray of the chest ?I independently visualized and interpreted imaging which showed no focal infiltrate or ?I agree with the radiologist interpretation ? ? ?Per my interpretation the patient's ECG shows normal sinus rhythm no acute ischemic finding ? ?I ordered medication including DuoNebs, steroids for suspected asthma exacerbation ?I have reviewed the patients home medicines and have made  adjustments as needed ? ?Test Considered:  ?-Overall my clinical suspicion for PE is lower at this time.  The patient is PERC negative; her symptoms are waxing and waning at times ? ?Medication refills provided for home. ? ?Pati

## 2022-04-25 NOTE — Patient Instructions (Addendum)
It was nice seeing you today! ? ?Therary resources below. ? ?Referral placed for colonoscopy. ? ?Try to avoid too much time on the toilet. ? ?Try nicotine patch once a day with nicotine lozenges as needed. Goal to quit by June 12th! ? ?[facility] ?[phone] ?[place], [place], [place] [address]  ? ?Stay well, ?Zola Button, MD ?[place] ?[phone] ? ?-- ? ? ?Therapy and Counseling Resources ?Most providers on this list will take Medicaid. Patients with commercial insurance or Medicare should contact their insurance company to get a list of in network providers. ? ?[facility] (spanish speaking therapist available)(habla espanol)(take medicare and medicaid)  ?[place], [place], [place] [address], [place] ?[email or web address] ?[phone] ? ?[facility] ?[address] [place], [place] [address] ?[phone] ? ?[facility]  ? [address] [place], [place] [address]      [phone] ? ?[facility] (spanish available) ?[place], [place] [address] ?[phone] ? ?[facility] (take medicaid and medicare) ?[address] [place], [place] [address]       [phone]    ? ?[facility] (virtual only) ?[phone] ? ?[facility] ?[address], [place], [place] ? ?[facility]:  [address] ? ?[facility]:  ?[place] [phone] ? ?[facility] (under & uninsured) ?[address], [place] [phone]    [email or web address]   ? ?[facility] ?[address]  [place]    [phone] ? ?[facility] ?[place]     Phone:  [phone]     [place]  [phone]  ? ?[facility] ?[address] [place], [place] ext [phone] ? ?[facility]: [place], [place], [place]  ? ?[facility] (Clyde Reino therapist) [email or web address]  ?[place] [address]   [place] [address]    [phone]   ? ?[facility]   ?[address] [place],  [place], [place]  ? ?[facility]  ?[address] [place]  [phone] ? ?[facility]  ?[place], [place]        [phone] ? ?Open Access/Walk In Clinic under & uninsured ? ?[facility]  ?[place], [place] ?[place] [phone] ?[place] [phone] ? ?[facility],  ?([place])   [place]: [phone]) 8:30 - 12; 1 - 2:30 ? ?[facility],  ?[address], [place]    [phone]):8:30 - 12; 2 - 3PM ? ?[facility],  ?[address]  [place]; [phone]):   Mon - Fri 8 AM - 5 PM ? ?[facility] ?[place]  MWF 12:30 to 3:00 or call to schedule an appointment  [phone] ? ?Specific Provider options ?[facility]  [email or web address] ?click on find a therapist  ?enter your zip code ?left side and select or tailor a therapist for your specific need.  ? ?[facility] Provider Directory ?[email or web address]  (Medicaid)   Follow all drop down to find a provider ? ?Social Support program ?[facility]ental [facility]  [place] ?336) H3156881 or [email or web address] ?[address], [place], [place] Recovery support and educational  ? ?24- Hour Availability:  ? ?[facility]  ?[place], [place] ?[place] [phone] ?[place] [phone] ? ?[facility] [employer] [facility] [phone] ? ?[facility]  [phone]  ? ?[place]  [phone]  (after hours) ? ?[facility]/Mobile Crisis   [phone] ? ?[place] National Suicide Hotline  (612)250-8790 Diamantina Monks) ? ?Call 911 or go to emergency room ? ?[facility]  [phone]);  [place] and [place]  ? ?[facility]  ?[phone]); [place], [place], [place], [place], [place], [place], [place]  ?-- ? ?Make sure to check out at the front desk before you leave today. ? ?Please arrive at least 15 minutes prior to your scheduled appointments. ? ?If you had blood work today, I will send you a MyChart message or a letter if results are normal. Otherwise, I will give you a call. ? ?If you had a referral placed, they will call you to set up an appointment. Please give [place] a call if you don't hear back in the next 2 weeks. ? ?If you need additional refills before your next appointment, please call your pharmacy first.  ?

## 2022-04-25 NOTE — Progress Notes (Signed)
    SUBJECTIVE:   CHIEF COMPLAINT / HPI:  Chief Complaint  Patient presents with   Annual Exam   Wrist Pain    Trying to lose weight to help her back pain. Eating more vegetables and has cut back on fast food.  HTN On amlodipine 5 mg, chlorthalidone 25 mg, lisinopril 40 mg. Reports good adherence with medications.  Depression/anxiety Takes buspirone 10 mg BID, hydroxyzine 50 mg qhs, desvenlafaxine 100 mg daily [facility]. Was seeing a therapist with [facility] but missed too many appointments, not seeing a therapist currently.  Still smoking 0.5 ppd. Has set a quit date of June 12th. Interested in trying nicotine replacement.  Also thinks she has hemorrhoids. Sits on the toilet for prolonged periods of time. Using OTC treatments with relief. Declines rectal exam.  PERTINENT  PMH / PSH: tobacco use (0.5 ppd), prediabetes, asthma, obesity, depression, anxiety  Patient Care Team: Zola Button, MD as PCP - General (Family Medicine)   OBJECTIVE:   BP 124/74   Pulse 100   Wt 256 lb (116.1 kg)   LMP 04/12/2022 (Approximate)   SpO2 97%   BMI 42.60 kg/m   Physical Exam Constitutional:      General: She is not in acute distress.    Appearance: She is obese.  Cardiovascular:     Rate and Rhythm: Normal rate and regular rhythm.  Pulmonary:     Effort: Pulmonary effort is normal. No respiratory distress.     Breath sounds: Normal breath sounds.  Musculoskeletal:     Cervical back: Neck supple.  Neurological:     Mental Status: She is alert.        04/26/2022    3:07 PM  Depression screen PHQ 2/9  Decreased Interest 0  Down, Depressed, Hopeless 0  PHQ - 2 Score 0  Altered sleeping 1  Tired, decreased energy 0  Change in appetite 0  Feeling bad or failure about yourself  0  Trouble concentrating 0  Moving slowly or fidgety/restless 0  Suicidal thoughts 0  PHQ-9 Score 1  Difficult doing work/chores Not difficult at all     Wt Readings from  Last 3 Encounters:  04/26/22 256 lb (116.1 kg)  03/14/22 257 lb (116.6 kg)  02/16/21 261 lb 4.8 oz (118.5 kg)        ASSESSMENT/PLAN:   Morbid obesity ([place]) Unable to undergo bariatric surgery due to insurance. Working on dietary changes. Info for [facility] given.  Tobacco dependence with current use 0.5 ppd current smoker. Quit date June 12th. - nicotine patch + lozenge prn sent  Essential hypertension Well controlled, no changes  MDD (major depressive disorder), severe ([place]) Followed by psychiatry. Therapy resources provided as she is not currently seeing one.  Hemorrhoids Counseled on reducing toilet time and measures to avoid constipation. Can use Miralax if needed.   HCM - HIV screening ordered - HCV screening ordered - screening lipid panel, A1c - colonoscopy referral placed - Covid vaccine declined  Return in about 2 months (around 06/26/2022) for f/u tobacco use.   Zola Button, MD [place]

## 2022-04-26 ENCOUNTER — Encounter: Payer: Self-pay | Admitting: Family Medicine

## 2022-04-26 ENCOUNTER — Ambulatory Visit: Payer: Medicaid Other | Admitting: Family Medicine

## 2022-04-26 VITALS — BP 124/74 | HR 100 | Wt 256.0 lb

## 2022-04-26 DIAGNOSIS — Z1159 Encounter for screening for other viral diseases: Secondary | ICD-10-CM | POA: Diagnosis not present

## 2022-04-26 DIAGNOSIS — Z13220 Encounter for screening for lipoid disorders: Secondary | ICD-10-CM

## 2022-04-26 DIAGNOSIS — Z114 Encounter for screening for human immunodeficiency virus [HIV]: Secondary | ICD-10-CM | POA: Diagnosis not present

## 2022-04-26 DIAGNOSIS — R7303 Prediabetes: Secondary | ICD-10-CM | POA: Diagnosis not present

## 2022-04-26 DIAGNOSIS — Z1211 Encounter for screening for malignant neoplasm of colon: Secondary | ICD-10-CM | POA: Diagnosis not present

## 2022-04-26 DIAGNOSIS — F17209 Nicotine dependence, unspecified, with unspecified nicotine-induced disorders: Secondary | ICD-10-CM

## 2022-04-26 DIAGNOSIS — R739 Hyperglycemia, unspecified: Secondary | ICD-10-CM

## 2022-04-26 DIAGNOSIS — F322 Major depressive disorder, single episode, severe without psychotic features: Secondary | ICD-10-CM

## 2022-04-26 DIAGNOSIS — F17200 Nicotine dependence, unspecified, uncomplicated: Secondary | ICD-10-CM

## 2022-04-26 DIAGNOSIS — Z716 Tobacco abuse counseling: Secondary | ICD-10-CM | POA: Diagnosis not present

## 2022-04-26 DIAGNOSIS — Z0000 Encounter for general adult medical examination without abnormal findings: Secondary | ICD-10-CM

## 2022-04-26 DIAGNOSIS — I10 Essential (primary) hypertension: Secondary | ICD-10-CM

## 2022-04-26 MED ORDER — AMLODIPINE BESYLATE 5 MG PO TABS: 5.0000 mg | ORAL_TABLET | Freq: Every day | ORAL | 1 refills | Status: DC

## 2022-04-26 MED ORDER — LISINOPRIL 40 MG PO TABS: 40.0000 mg | ORAL_TABLET | Freq: Every day | ORAL | 1 refills | Status: DC

## 2022-04-26 MED ORDER — NICOTINE 7 MG/24HR TD PT24: 7.0000 mg | MEDICATED_PATCH | TRANSDERMAL | 2 refills | Status: DC

## 2022-04-26 MED ORDER — NICOTINE POLACRILEX 2 MG MT LOZG: 2.0000 mg | LOZENGE | OROMUCOSAL | 2 refills | Status: DC | PRN

## 2022-04-26 MED ORDER — CHLORTHALIDONE 25 MG PO TABS: 25.0000 mg | ORAL_TABLET | Freq: Every day | ORAL | 0 refills | Status: DC

## 2022-04-26 NOTE — Assessment & Plan Note (Signed)
Followed by psychiatry. Therapy resources provided as she is not currently seeing one. ?

## 2022-04-26 NOTE — Assessment & Plan Note (Signed)
Unable to undergo bariatric surgery due to insurance. Working on dietary changes. Info for [facility] given. ?

## 2022-04-26 NOTE — Addendum Note (Signed)
Addended by: Zola Button D on: 04/26/2022 05:40 PM ? ? Modules accepted: Orders ? ?

## 2022-04-26 NOTE — Assessment & Plan Note (Signed)
0.5 ppd current smoker. Quit date June 12th. ?- nicotine patch + lozenge prn sent ?

## 2022-04-26 NOTE — Assessment & Plan Note (Signed)
Well controlled, no changes 

## 2022-04-27 LAB — LIPID PANEL
Chol/HDL Ratio: 4.2 ratio (ref 0.0–4.4)
Cholesterol, Total: 188 mg/dL (ref 100–199)
HDL: 45 mg/dL (ref >39)
LDL Chol Calc (NIH): 125 mg/dL — ABNORMAL HIGH (ref 0–99)
Triglycerides: 101 mg/dL (ref 0–149)
VLDL Cholesterol Cal: 18 mg/dL (ref 5–40)

## 2022-04-27 LAB — HIV ANTIBODY (ROUTINE TESTING W REFLEX): HIV Screen 4th Generation wRfx: NONREACTIVE

## 2022-04-27 LAB — HCV AB W REFLEX TO QUANT PCR: HCV Ab: NONREACTIVE

## 2022-04-27 LAB — HEMOGLOBIN A1C
Est. average glucose Bld gHb Est-mCnc: 126 mg/dL
Hgb A1c MFr Bld: 6 % — ABNORMAL HIGH (ref 4.8–5.6)

## 2022-04-27 LAB — HCV INTERPRETATION

## 2022-05-16 ENCOUNTER — Encounter: Payer: Self-pay | Admitting: *Deleted

## 2022-07-11 ENCOUNTER — Ambulatory Visit (AMBULATORY_SURGERY_CENTER): Payer: Medicaid Other | Admitting: *Deleted

## 2022-07-11 VITALS — Ht 65.0 in | Wt 260.0 lb

## 2022-07-11 DIAGNOSIS — Z1211 Encounter for screening for malignant neoplasm of colon: Secondary | ICD-10-CM

## 2022-07-11 MED ORDER — NA SULFATE-K SULFATE-MG SULF 17.5-3.13-1.6 GM/177ML PO SOLN: 1.0000 | ORAL | 0 refills | Status: DC

## 2022-07-11 NOTE — Progress Notes (Signed)
Patient's pre-visit was done today over the phone with the patient. Name,DOB and address verified. Patient denies any allergies to Eggs and Soy. Patient denies any problems with anesthesia/sedation. Patient is not taking any diet pills or blood thinners. No home Oxygen. Insurance confirmed with patient.  Went over prep instructions with patient. Prep instructions sent to pt's MyChart & mailed to pt-pt is aware. Patient understands to call us back with any questions or concerns. Patient is aware of our care-partner policy. Emmi sent to pt's mychart.

## 2022-07-24 ENCOUNTER — Other Ambulatory Visit: Payer: Self-pay | Admitting: Family Medicine

## 2022-08-07 ENCOUNTER — Ambulatory Visit (AMBULATORY_SURGERY_CENTER): Payer: Medicaid Other | Admitting: Gastroenterology

## 2022-08-07 ENCOUNTER — Encounter: Payer: Self-pay | Admitting: Gastroenterology

## 2022-08-07 VITALS — BP 118/97 | HR 69 | Temp 97.1°F | Resp 11 | Ht 65.0 in | Wt 260.0 lb

## 2022-08-07 DIAGNOSIS — K635 Polyp of colon: Secondary | ICD-10-CM

## 2022-08-07 DIAGNOSIS — Z1211 Encounter for screening for malignant neoplasm of colon: Secondary | ICD-10-CM

## 2022-08-07 DIAGNOSIS — D123 Benign neoplasm of transverse colon: Secondary | ICD-10-CM

## 2022-08-07 MED ORDER — SODIUM CHLORIDE 0.9 % IV SOLN: 500.0000 mL | INTRAVENOUS | Status: DC

## 2022-08-07 NOTE — Op Note (Signed)
[facility] Patient Name: Tracy Coleman Procedure Date: 08/07/2022 10:29 AM MRN: [id number] Endoscopist: Mauri Pole , MD Age: 46 Referring MD:  Date of Birth: 1976/10/26 Gender: Female Account #: [id number] Procedure:                Colonoscopy Indications:              Screening for malignant neoplasm in the rectum Medicines:                Monitored Anesthesia Care Procedure:                Pre-Anesthesia Assessment:                           - Prior to the procedure, a History and Physical                            was performed, and patient medications and                            allergies were reviewed. The patient's tolerance of                            previous anesthesia was also reviewed. The risks                            and benefits of the procedure and the sedation                            options and risks were discussed with the patient.                            All questions were answered, and informed consent                            was obtained. Prior Anticoagulants: The patient has                            taken no previous anticoagulant or antiplatelet                            agents. ASA Grade Assessment: III - A patient with                            severe systemic disease. After reviewing the risks                            and benefits, the patient was deemed in                            satisfactory condition to undergo the procedure.                           After obtaining informed consent, the colonoscope  was passed under direct vision. Throughout the                            procedure, the patient's blood pressure, pulse, and                            oxygen saturations were monitored continuously. The                            Olympus CF-HQ190L ([id number]) Colonoscope was                            introduced through the anus and advanced to the the                            cecum,  identified by appendiceal orifice and                            ileocecal valve. The colonoscopy was performed                            without difficulty. The patient tolerated the                            procedure well. The quality of the bowel                            preparation was good. The ileocecal valve,                            appendiceal orifice, and rectum were photographed. Scope In: 10:41:26 AM Scope Out: 10:55:05 AM Scope Withdrawal Time: 0 hours 10 minutes 4 seconds  Total Procedure Duration: 0 hours 13 minutes 39 seconds  Findings:                 The perianal and digital rectal examinations were                            normal.                           A 5 mm polyp was found in the transverse colon. The                            polyp was sessile. The polyp was removed with a                            cold snare. Resection and retrieval were complete.                           Non-bleeding external and internal hemorrhoids were                            found during retroflexion. The hemorrhoids were  medium-sized. Complications:            No immediate complications. Estimated Blood Loss:     Estimated blood loss was minimal. Impression:               - One 5 mm polyp in the transverse colon, removed                            with a cold snare. Resected and retrieved.                           - Non-bleeding external and internal hemorrhoids. Recommendation:           - Patient has a contact number available for                            emergencies. The signs and symptoms of potential                            delayed complications were discussed with the                            patient. Return to normal activities tomorrow.                            Written discharge instructions were provided to the                            patient.                           - Resume previous diet.                           -  Continue present medications.                           - Await pathology results.                           - Repeat colonoscopy in 5-10 years for surveillance                            based on pathology results. Mauri Pole, MD 08/07/2022 11:04:56 AM This report has been signed electronically.

## 2022-08-07 NOTE — Progress Notes (Signed)
Called to room to assist during endoscopic procedure.  Patient ID and intended procedure confirmed with present staff. Received instructions for my participation in the procedure from the performing physician.  

## 2022-08-07 NOTE — Progress Notes (Signed)
[facility] History and Physical   Primary Care Physician:  Zola Button, MD   Reason for Procedure:  Colorectal cancer screening  Plan:    Screening colonoscopy with possible interventions as needed     HPI: Tracy Coleman is a very pleasant 46 y.o. female here for screening colonoscopy. Denies any nausea, vomiting, abdominal pain, melena or bright red blood per rectum  The risks and benefits as well as alternatives of endoscopic procedure(s) have been discussed and reviewed. All questions answered. The patient agrees to proceed.    Past Medical History:  Diagnosis Date   Anxiety    Arthritis    feet oa   Asthma    Atypical chest pain 02/06/2019   Closed fracture of navicular bone of foot 02/07/2019   COVID 07/2020   loss of smell headache body aches x 7 days all symptoms resolved   Depression    Dyspnea    with exertion   GERD (gastroesophageal reflux disease)    Hypertension    Obesity    PTSD (post-traumatic stress disorder)    Wears glasses     Past Surgical History:  Procedure Laterality Date   ARTHRODESIS METATARSALPHALANGEAL JOINT (MTPJ) Right 04/11/2018   Procedure: ARTHRODESIS METATARSALPHALANGEAL JOINT (MTPJ);  Surgeon: Wylene Simmer, MD;  Location: [place];  Service: Orthopedics;  Laterality: Right;   FOOT ARTHRODESIS Right 01/23/2019   Procedure: Open treatment of right navicular nonunion;  Surgeon: Wylene Simmer, MD;  Location: [place];  Service: Orthopedics;  Laterality: Right;   HYSTEROSCOPY WITH D & C N/A 02/16/2021   Procedure: HYSTEROSCOPY AND IUD REMOVAL;  Surgeon: Megan Salon, MD;  Location: [facility];  Service: Gynecology;  Laterality: N/A;   LEEP     x2. Last one 2013   OPERATIVE ULTRASOUND N/A 02/16/2021   Procedure: OPERATIVE ULTRASOUND;  Surgeon: Megan Salon, MD;  Location: [facility];  Service: Gynecology;  Laterality: N/A;    Prior to Admission medications    Medication Sig Start Date End Date Taking? Authorizing Provider  albuterol (VENTOLIN HFA) 108 (90 Base) MCG/ACT inhaler Inhale 2 puffs into the lungs every 4 (four) hours as needed for wheezing or shortness of breath. 10/02/21  Yes Isla Pence, MD  amLODipine (NORVASC) 5 MG tablet Take 1 tablet (5 mg total) by mouth daily. 04/26/22  Yes Zola Button, MD  busPIRone (BUSPAR) 10 MG tablet Take 10 mg by mouth 2 (two) times daily.   Yes [provider]  chlorthalidone (HYGROTON) 25 MG tablet Take 1 tablet by mouth once daily 07/24/22  Yes Zola Button, MD  cyanocobalamin (VITAMIN B12) 1000 MCG tablet Take 1,000 mcg by mouth daily.   Yes [provider]  desvenlafaxine (PRISTIQ) 100 MG 24 hr tablet Take 100 mg by mouth daily.   Yes [provider]  hydrOXYzine (VISTARIL) 50 MG capsule hydroxyzine pamoate 50 mg capsule  TAKE 1 CAPSULE BY MOUTH AT BEDTIME   Yes [provider]  lisinopril (ZESTRIL) 40 MG tablet Take 1 tablet (40 mg total) by mouth daily. 04/26/22  Yes Zola Button, MD  traZODone (DESYREL) 100 MG tablet trazodone 100 mg tablet  TAKE 2 TABLETS BY MOUTH AT BEDTIME   Yes [provider]  VITAMIN D PO Take by mouth.   Yes [provider]  albuterol (PROVENTIL) (2.5 MG/3ML) 0.083% nebulizer solution Take 3 mLs (2.5 mg total) by nebulization every 6 (six) hours as needed for wheezing or shortness of breath.  10/02/21   Isla Pence, MD  fluticasone (FLONASE) 50 MCG/ACT nasal spray Place 1 spray into both nostrils daily. 11/02/20   Zola Button, MD    Current Outpatient Medications  Medication Sig Dispense Refill   albuterol (VENTOLIN HFA) 108 (90 Base) MCG/ACT inhaler Inhale 2 puffs into the lungs every 4 (four) hours as needed for wheezing or shortness of breath. 18 g 0   amLODipine (NORVASC) 5 MG tablet Take 1 tablet (5 mg total) by mouth daily. 90 tablet 1   busPIRone (BUSPAR) 10 MG tablet Take 10 mg by mouth 2 (two) times daily.      chlorthalidone (HYGROTON) 25 MG tablet Take 1 tablet by mouth once daily 90 tablet 0   cyanocobalamin (VITAMIN B12) 1000 MCG tablet Take 1,000 mcg by mouth daily.     desvenlafaxine (PRISTIQ) 100 MG 24 hr tablet Take 100 mg by mouth daily.     hydrOXYzine (VISTARIL) 50 MG capsule hydroxyzine pamoate 50 mg capsule  TAKE 1 CAPSULE BY MOUTH AT BEDTIME     lisinopril (ZESTRIL) 40 MG tablet Take 1 tablet (40 mg total) by mouth daily. 90 tablet 1   traZODone (DESYREL) 100 MG tablet trazodone 100 mg tablet  TAKE 2 TABLETS BY MOUTH AT BEDTIME     VITAMIN D PO Take by mouth.     albuterol (PROVENTIL) (2.5 MG/3ML) 0.083% nebulizer solution Take 3 mLs (2.5 mg total) by nebulization every 6 (six) hours as needed for wheezing or shortness of breath. 75 mL 0   fluticasone (FLONASE) 50 MCG/ACT nasal spray Place 1 spray into both nostrils daily. 16 g 0   Current Facility-Administered Medications  Medication Dose Route Frequency Provider Last Rate Last Admin   0.9 %  sodium chloride infusion  500 mL Intravenous Continuous Shalee Paolo, Venia Minks, MD        Allergies as of 08/07/2022 - Review Complete 08/07/2022  Allergen Reaction Noted   Varenicline Other (See Comments) 05/03/2013    Family History  Problem Relation Age of Onset   Alcohol abuse Mother    Drug abuse Mother    Depression Mother    Early death Mother        pneumonia, age 50   Hypertension Mother    Colon polyps Father    Alcohol abuse Father    Drug abuse Father    Diabetes Maternal Grandmother    Hypertension Maternal Grandmother    Diabetes Paternal Grandmother    Hypertension Paternal Grandmother    Colon cancer Neg Hx    Esophageal cancer Neg Hx    Rectal cancer Neg Hx    Stomach cancer Neg Hx     Social History   Socioeconomic History   Marital status: Married    Spouse name: Not on file   Number of children: Not on file   Years of education: Not on file   Highest education level: Not on file  Occupational History    Not on file  Tobacco Use   Smoking status: Every Day    Packs/day: 0.50    Years: 30.00    Total pack years: 15.00    Types: Cigarettes   Smokeless tobacco: Never  Vaping Use   Vaping Use: Never used  Substance and Sexual Activity   Alcohol use: Yes    Alcohol/week: 5.0 standard drinks of alcohol    Types: 5 Standard drinks or equivalent per week   Drug use: Yes    Frequency: 7.0 times per week  Types: Marijuana   Sexual activity: Yes  Other Topics Concern   Not on file  Social History Narrative   Not on file   Social Determinants of Health   Financial Resource Strain: Not on file  Food Insecurity: No Food Insecurity (01/12/2021)   Hunger Vital Sign    Worried About Running Out of Food in the Last Year: Never true    [place] in the Last Year: Never true  Transportation Needs: No Transportation Needs (01/12/2021)   PRAPARE - Hydrologist (Medical): No    Lack of Transportation (Non-Medical): No  Physical Activity: Not on file  Stress: Not on file  Social Connections: Not on file  Intimate Partner Violence: Not on file    Review of Systems:  All other review of systems negative except as mentioned in the HPI.  Physical Exam: Vital signs in last 24 hours: BP 125/67   Pulse 81   Temp (!) 97.1 F (36.2 C)   Ht '5\' 5"'$  (1.651 m)   Wt 260 lb (117.9 kg)   LMP 03/11/2022   SpO2 98%   BMI 43.27 kg/m  General:   Alert, NAD Lungs:  Clear .   Heart:  Regular rate and rhythm Abdomen:  Soft, nontender and nondistended. Neuro/Psych:  Alert and cooperative. Normal mood and affect. A and O x 3  Reviewed labs, radiology imaging, old records and pertinent past GI work up  Patient is appropriate for planned procedure(s) and anesthesia in an ambulatory setting   K. Denzil Magnuson , MD [phone]

## 2022-08-07 NOTE — Patient Instructions (Signed)
Handout on hemorrhoids and polyps given to patient.  Await pathology results. Resume previous diet and continue present medications. Repeat colonoscopy for surveillance in 5-10 years!   YOU HAD AN ENDOSCOPIC PROCEDURE TODAY AT [facility]:   Refer to the procedure report that was given to you for any specific questions about what was found during the examination.  If the procedure report does not answer your questions, please call your gastroenterologist to clarify.  If you requested that your care partner not be given the details of your procedure findings, then the procedure report has been included in a sealed envelope for you to review at your convenience later.  YOU SHOULD EXPECT: Some feelings of bloating in the abdomen. Passage of more gas than usual.  Walking can help get rid of the air that was put into your GI tract during the procedure and reduce the bloating. If you had a lower endoscopy (such as a colonoscopy or flexible sigmoidoscopy) you may notice spotting of blood in your stool or on the toilet paper. If you underwent a bowel prep for your procedure, you may not have a normal bowel movement for a few days.  Please Note:  You might notice some irritation and congestion in your nose or some drainage.  This is from the oxygen used during your procedure.  There is no need for concern and it should clear up in a day or so.  SYMPTOMS TO REPORT IMMEDIATELY:  Following lower endoscopy (colonoscopy or flexible sigmoidoscopy):  Excessive amounts of blood in the stool  Significant tenderness or worsening of abdominal pains  Swelling of the abdomen that is new, acute  Fever of 100F or higher   For urgent or emergent issues, a gastroenterologist can be reached at any hour by calling [phone]. Do not use MyChart messaging for urgent concerns.    DIET:  We do recommend a small meal at first, but then you may proceed to your regular diet.  Drink plenty of fluids but  you should avoid alcoholic beverages for 24 hours.  ACTIVITY:  You should plan to take it easy for the rest of today and you should NOT DRIVE or use heavy machinery until tomorrow (because of the sedation medicines used during the test).    FOLLOW UP: Our staff will call the number listed on your records the next business day following your procedure.  We will call around 7:15- 8:00 am to check on you and address any questions or concerns that you may have regarding the information given to you following your procedure. If we do not reach you, we will leave a message.  If you develop any symptoms (ie: fever, flu-like symptoms, shortness of breath, cough etc.) before then, please call [phone].  If you test positive for Covid 19 in the 2 weeks post procedure, please call and report this information to [place].    If any biopsies were taken you will be contacted by phone or by letter within the next 1-3 weeks.  Please call us at [phone] if you have not heard about the biopsies in 3 weeks.    SIGNATURES/CONFIDENTIALITY: You and/or your care partner have signed paperwork which will be entered into your electronic medical record.  These signatures attest to the fact that that the information above on your After Visit Summary has been reviewed and is understood.  Full responsibility of the confidentiality of this discharge information lies with you and/or your care-partner.

## 2022-08-07 NOTE — Progress Notes (Signed)
To pacu, VSS. Report to Rn,tb 

## 2022-08-08 ENCOUNTER — Telehealth: Payer: Self-pay | Admitting: *Deleted

## 2022-08-08 NOTE — Telephone Encounter (Signed)
  Follow up Call-     08/07/2022   10:23 AM  Call back number  Post procedure Call Back phone  # [phone]  Permission to leave phone message Yes     Patient questions:  Do you have a fever, pain , or abdominal swelling? No. Pain Score  0 *  Have you tolerated food without any problems? Yes.    Have you been able to return to your normal activities? Yes.    Do you have any questions about your discharge instructions: Diet   No. Medications  No. Follow up visit  No.  Do you have questions or concerns about your Care? No.  Actions: * If pain score is 4 or above: No action needed, pain <4.

## 2022-08-18 ENCOUNTER — Encounter: Payer: Self-pay | Admitting: Gastroenterology

## 2022-08-25 ENCOUNTER — Other Ambulatory Visit: Payer: Self-pay | Admitting: Family Medicine

## 2022-08-25 DIAGNOSIS — Z1231 Encounter for screening mammogram for malignant neoplasm of breast: Secondary | ICD-10-CM

## 2022-09-25 ENCOUNTER — Ambulatory Visit
Admission: RE | Admit: 2022-09-25 | Discharge: 2022-09-25 | Disposition: A | Discharge status: Home / Self Care | Payer: Medicaid Other | Source: Ambulatory Visit | Attending: Cardiology | Admitting: Cardiology

## 2022-09-25 DIAGNOSIS — Z1231 Encounter for screening mammogram for malignant neoplasm of breast: Secondary | ICD-10-CM

## 2022-11-27 ENCOUNTER — Other Ambulatory Visit: Payer: Self-pay | Admitting: Family Medicine

## 2022-12-07 ENCOUNTER — Other Ambulatory Visit (HOSPITAL_COMMUNITY): Payer: Self-pay

## 2022-12-07 ENCOUNTER — Telehealth: Payer: Self-pay

## 2022-12-07 ENCOUNTER — Encounter: Payer: Self-pay | Admitting: Family Medicine

## 2022-12-07 ENCOUNTER — Ambulatory Visit: Payer: Medicaid Other | Admitting: Family Medicine

## 2022-12-07 VITALS — BP 136/100 | HR 77 | Ht 65.0 in | Wt 264.0 lb

## 2022-12-07 DIAGNOSIS — J4521 Mild intermittent asthma with (acute) exacerbation: Secondary | ICD-10-CM

## 2022-12-07 DIAGNOSIS — I10 Essential (primary) hypertension: Secondary | ICD-10-CM

## 2022-12-07 DIAGNOSIS — S91301A Unspecified open wound, right foot, initial encounter: Secondary | ICD-10-CM | POA: Diagnosis not present

## 2022-12-07 DIAGNOSIS — R0602 Shortness of breath: Secondary | ICD-10-CM

## 2022-12-07 MED ORDER — PREDNISONE 20 MG PO TABS: 40.0000 mg | ORAL_TABLET | Freq: Every day | ORAL | 0 refills | Status: AC

## 2022-12-07 MED ORDER — BUDESONIDE-FORMOTEROL FUMARATE 80-4.5 MCG/ACT IN AERO: 2.0000 | INHALATION_SPRAY | Freq: Two times a day (BID) | RESPIRATORY_TRACT | 3 refills | Status: DC

## 2022-12-07 NOTE — Assessment & Plan Note (Signed)
Currently in exacerbation, trigger is unclear.  With productive cough and adventitious lung sounds on exam, concern for possible pneumonia. - prednisone 40 mg x 5d - switch to SMART therapy, budesonide-formoterol 2 puffs twice daily and as needed - CXR

## 2022-12-07 NOTE — Patient Instructions (Addendum)
It was nice seeing you today!  Take prednisone as prescribed for that 5 days.  Instead of the albuterol inhaler, use the Symbicort inhaler 2 puffs twice a day and as needed up to 12 puffs/day.  Get your X-ray here. You do not need an appointment. [facility] Address: [address], [place], [place]  [address] Phone: [phone]   For your wound, apply a nonadherent pad every day with some Vaseline.  Do not use any hydrogen peroxide as this can irritate the wound.  For your tingling, try a wrist splint for carpal tunnel or a cock up splint.  Come back and see me in 1 month to make sure the wound is healing well or sooner if needed.  Stay well, Zola Button, MD [place] [phone]  --  Make sure to check out at the front desk before you leave today.  Please arrive at least 15 minutes prior to your scheduled appointments.  If you had blood work today, I will send you a MyChart message or a letter if results are normal. Otherwise, I will give you a call.  If you had a referral placed, they will call you to set up an appointment. Please give [place] a call if you don't hear back in the next 2 weeks.  If you need additional refills before your next appointment, please call your pharmacy first.

## 2022-12-07 NOTE — Telephone Encounter (Signed)
Pharmacy Patient Advocate Encounter  Prior Authorization for Symbicort  was sent test claim was run and  PA  NOT NEEDED!  Key: [place] with Pharmacy to RE process [place].   Sandre [facility] Patient Advocate

## 2022-12-07 NOTE — Progress Notes (Signed)
    SUBJECTIVE:   CHIEF COMPLAINT / HPI:  Chief Complaint  Patient presents with   Asthma    At previous visit for routine physical in May of this year, patient had sent a quit date of June 12th. She is still smoking, now up to 1 ppd.  She reports her asthma has been flaring up for the past 2-3 weeks. Asthma symptom frequency: most days and/or nocturnal awakenings >/= 1 time/month  She has been using her albuterol inhaler 4-5 times a day for the past 2-3 weeks. She took some leftover prednisone 30 mg every few days for 1 week.   She also has a rash on her right foot which she noticed 1-2 months ago. She feels like the area has been getting bitter. It started out as two dots. It has been oozing. She is unsure if she had been bitten. She has been keeping it covered, applying antibacterial ointment and peroxide. She has had some itching to the area and occasional burning.  Patient reports she has not taken her blood pressure medications today yet  PERTINENT  PMH / PSH: Asthma, anxiety, depression (followed by [facility]), HTN, prediabetes, tobacco use  Patient Care Team: Zola Button, MD as PCP - General (Family Medicine)   OBJECTIVE:   BP (!) 136/100   Pulse 77   Ht '5\' 5"'$  (1.651 m)   Wt 264 lb (119.7 kg)   SpO2 100%   BMI 43.93 kg/m   Physical Exam Constitutional:      General: She is not in acute distress. HENT:     Head: Normocephalic and atraumatic.  Cardiovascular:     Rate and Rhythm: Normal rate and regular rhythm.     Pulses:          Posterior tibial pulses are 2+ on the right side.  Pulmonary:     Effort: Pulmonary effort is normal. No respiratory distress.     Comments: Faint scattered expiratory wheezes.  There are faint fine rales appreciated in the right lower lung field. Musculoskeletal:     Cervical back: Neck supple.  Skin:    Comments: Dorsum of right foot there is an irregular shaped large ulcerating lesion, no surrounding erythema  Neurological:      Mental Status: She is alert.          {Show previous vital signs (optional):23777}    ASSESSMENT/PLAN:   Essential hypertension Elevated today but she has not taken her medications yet.  Will continue to monitor  Asthma Currently in exacerbation, trigger is unclear.  With productive cough and adventitious lung sounds on exam, concern for possible pneumonia. - prednisone 40 mg x 5d - switch to SMART therapy, budesonide-formoterol 2 puffs twice daily and as needed - CXR   Wound of right foot Based on history, suspect she may have had a spider bite causing an ulcerative lesion.  Use of hydrogen peroxide is likely providing proper healing.  No obvious signs of superimposed infection at this time. - daily dressing changes with non-adherent pad + Vaseline - stop hydrogen peroxide  Return in about 4 weeks (around 01/04/2023) for f/u wound.   Zola Button, MD [place]

## 2022-12-07 NOTE — Assessment & Plan Note (Signed)
Elevated today but she has not taken her medications yet.  Will continue to monitor

## 2022-12-08 ENCOUNTER — Ambulatory Visit
Admission: RE | Admit: 2022-12-08 | Discharge: 2022-12-08 | Disposition: A | Discharge status: Home / Self Care | Payer: Medicaid Other | Source: Ambulatory Visit | Attending: Family Medicine | Admitting: Family Medicine

## 2022-12-08 DIAGNOSIS — R0602 Shortness of breath: Secondary | ICD-10-CM

## 2022-12-08 DIAGNOSIS — J4521 Mild intermittent asthma with (acute) exacerbation: Secondary | ICD-10-CM

## 2022-12-19 DIAGNOSIS — F1220 Cannabis dependence, uncomplicated: Secondary | ICD-10-CM | POA: Diagnosis not present

## 2022-12-19 DIAGNOSIS — F411 Generalized anxiety disorder: Secondary | ICD-10-CM | POA: Diagnosis not present

## 2022-12-19 DIAGNOSIS — F1020 Alcohol dependence, uncomplicated: Secondary | ICD-10-CM | POA: Diagnosis not present

## 2022-12-19 DIAGNOSIS — F331 Major depressive disorder, recurrent, moderate: Secondary | ICD-10-CM | POA: Diagnosis not present

## 2022-12-19 DIAGNOSIS — F4312 Post-traumatic stress disorder, chronic: Secondary | ICD-10-CM | POA: Diagnosis not present

## 2022-12-26 ENCOUNTER — Telehealth: Payer: Medicaid Other | Admitting: Physician Assistant

## 2022-12-26 DIAGNOSIS — H9201 Otalgia, right ear: Secondary | ICD-10-CM | POA: Diagnosis not present

## 2022-12-26 MED ORDER — AZITHROMYCIN 250 MG PO TABS: ORAL_TABLET | ORAL | 0 refills | Status: AC

## 2022-12-26 NOTE — Progress Notes (Signed)
E-Visit for Ear Pain - Acute Otitis Media   We are sorry that you are not feeling well. Here is how we plan to help!  Based on what you have shared with me it looks like you have Acute Otitis Media.  Acute Otitis Media is an infection of the middle or "inner" ear. This type of infection can cause redness, inflammation, and fluid buildup behind the tympanic membrane (ear drum).  The usual symptoms include: Earache/Pain Fever Upper respiratory symptoms Lack of energy/Fatigue/Malaise Slight hearing loss gradually worsening- if the inner ear fills with fluid What causes middle ear infections? Most middle ear infections occur when an infection such as a cold, leads to a build-up of mucus in the middle ear and causes the Eustachian tube (a thin tube that runs from the middle ear to the back of the nose) to become swollen or blocked.   This means mucus can't drain away properly, making it easier for an infection to spread into the middle ear.  How middle ear infections are treated: Most ear infections clear up within three to five days and don't need any specific treatment. If necessary, tylenol or ibuprofen should be used to relieve pain and a high temperature.  If you develop a fever higher than 102, or any significantly worsening symptoms, this could indicate a more serious infection moving to the middle/inner and needs face to face evaluation in an office by a provider.   Antibiotics aren't routinely used to treat middle ear infections, although they may occasionally be prescribed if symptoms persist or are particularly severe. Given your presentation,   I have prescribed Azithromycin 250 mg two tablets by mouth on day 1, then 1 tablet by mouth daily until completed    Your symptoms should improve over the next 3 days and should resolve in about 7 days. Be sure to complete ALL of the prescription(s) given.  HOME CARE: Wash your hands frequently. If you are prescribed an ear drop, do not  place the tip of the bottle on your ear or touch it with your fingers. You can take Acetaminophen 650 mg every 4-6 hours as needed for pain.  If pain is severe or moderate, you can apply a heating pad (set on low) or hot water bottle (wrapped in a towel) to outer ear for 20 minutes.  This will also increase drainage.  GET HELP RIGHT AWAY IF: Fever is over 102.2 degrees. You develop progressive ear pain or hearing loss. Ear symptoms persist longer than 3 days after treatment.  MAKE SURE YOU: Understand these instructions. Will watch your condition. Will get help right away if you are not doing well or get worse.  Thank you for choosing an e-visit.  Your e-visit answers were reviewed by a board certified advanced clinical practitioner to complete your personal care plan. Depending upon the condition, your plan could have included both over the counter or prescription medications.  Please review your pharmacy choice. Make sure the pharmacy is open so you can pick up the prescription now. If there is a problem, you may contact your provider through [employer] and have the prescription routed to another pharmacy.  Your safety is important to [place]. If you have drug allergies check your prescription carefully.   For the next 24 hours you can use MyChart to ask questions about today's visit, request a non-urgent call back, or ask for a work or school excuse. You will get an email with a survey after your eVisit asking about  your experience. We would appreciate your feedback. I hope that your e-visit has been valuable and will aid in your recovery.

## 2022-12-26 NOTE — Progress Notes (Signed)
I have spent 5 minutes in review of e-visit questionnaire, review and updating patient chart, medical decision making and response to patient.   Adina Puzzo Cody Cherlynn Popiel, PA-C    

## 2022-12-27 ENCOUNTER — Telehealth: Payer: Self-pay

## 2022-12-27 NOTE — Telephone Encounter (Signed)
Patient LVM on nurse line yesterday requesting a call back to discuss "clogged ears."   Patient reports she has been sick for ~ 2 weeks and is feeling better, however feels fullness in both ears.   I see patient had an evisit for concerns and was prescribed antibiotics.   Attempted to call patient to see how she is doing, however no answer.

## 2023-01-08 NOTE — Patient Instructions (Incomplete)
It was nice seeing you today!  Blood work today.  See me in 3 months or whenever is a good for you.  Stay well, Jessy Calixte, MD [facility] [phone]  --  Make sure to check out at the front desk before you leave today.  Please arrive at least 15 minutes prior to your scheduled appointments.  If you had blood work today, I will send you a MyChart message or a letter if results are normal. Otherwise, I will give you a call.  If you had a referral placed, they will call you to set up an appointment. Please give us a call if you don't hear back in the next 2 weeks.  If you need additional refills before your next appointment, please call your pharmacy first.  

## 2023-01-08 NOTE — Progress Notes (Deleted)
    SUBJECTIVE:   CHIEF COMPLAINT / HPI:  No chief complaint on file.   At last visit 1 month ago she had a wound on the dorsum of her right foot possibly due to spider bite advised to perform daily dressing changes with nonadherent pad and Vaseline, discontinue hydrogen peroxide that she was using at home.  PERTINENT  PMH / PSH: ***  Patient Care Team: Zola Button, MD as PCP - General (Family Medicine)   OBJECTIVE:   There were no vitals taken for this visit.  Physical Exam      12/07/2022   10:16 AM  Depression screen PHQ 2/9  Decreased Interest 1  Down, Depressed, Hopeless 0  PHQ - 2 Score 1  Altered sleeping 0  Tired, decreased energy 1  Change in appetite 2  Feeling bad or failure about yourself  2  Trouble concentrating 0  Moving slowly or fidgety/restless 0  Suicidal thoughts 0  PHQ-9 Score 6  Difficult doing work/chores Not difficult at all     {Show previous vital signs (optional):23777}  {Labs  Heme  Chem  Endocrine  Serology  Results Review (optional):23779}  ASSESSMENT/PLAN:   No problem-specific Assessment & Plan notes found for this encounter.    No follow-ups on file.   Zola Button, MD [place]

## 2023-01-09 ENCOUNTER — Ambulatory Visit: Payer: Medicaid Other | Admitting: Family Medicine

## 2023-01-22 ENCOUNTER — Encounter: Payer: Self-pay | Admitting: Family Medicine

## 2023-01-22 ENCOUNTER — Ambulatory Visit: Payer: Medicaid Other | Admitting: Family Medicine

## 2023-01-22 VITALS — BP 143/110 | HR 93 | Ht 65.0 in | Wt 264.8 lb

## 2023-01-22 DIAGNOSIS — S91301A Unspecified open wound, right foot, initial encounter: Secondary | ICD-10-CM | POA: Diagnosis not present

## 2023-01-22 DIAGNOSIS — E782 Mixed hyperlipidemia: Secondary | ICD-10-CM

## 2023-01-22 DIAGNOSIS — I10 Essential (primary) hypertension: Secondary | ICD-10-CM

## 2023-01-22 DIAGNOSIS — N951 Menopausal and female climacteric states: Secondary | ICD-10-CM | POA: Diagnosis not present

## 2023-01-22 DIAGNOSIS — F411 Generalized anxiety disorder: Secondary | ICD-10-CM | POA: Diagnosis not present

## 2023-01-22 DIAGNOSIS — F4312 Post-traumatic stress disorder, chronic: Secondary | ICD-10-CM | POA: Diagnosis not present

## 2023-01-22 DIAGNOSIS — R7303 Prediabetes: Secondary | ICD-10-CM | POA: Diagnosis not present

## 2023-01-22 DIAGNOSIS — F1220 Cannabis dependence, uncomplicated: Secondary | ICD-10-CM | POA: Diagnosis not present

## 2023-01-22 DIAGNOSIS — F331 Major depressive disorder, recurrent, moderate: Secondary | ICD-10-CM | POA: Diagnosis not present

## 2023-01-22 DIAGNOSIS — F1020 Alcohol dependence, uncomplicated: Secondary | ICD-10-CM | POA: Diagnosis not present

## 2023-01-22 MED ORDER — LISINOPRIL 40 MG PO TABS: 40.0000 mg | ORAL_TABLET | Freq: Every day | ORAL | 1 refills | Status: DC

## 2023-01-22 NOTE — Progress Notes (Signed)
    SUBJECTIVE:   CHIEF COMPLAINT / HPI:  Chief Complaint  Patient presents with   1 mo f/u   Medication Refill    At last visit about 6 weeks ago I saw this patient for foot wound due to possible spider bite for which I recommended daily dressing changes with Vaseline and to stop using hydrogen peroxide. Foot is doing better, still using bandages and Vaseline Somewhat itchy when dry  Does not check BP at home Ran out of lisinopril 3 weeks ago Still takes amlodipine and chlorthalidone  Normally has irregular periods, may get a period every 8 months IUD was removed last year, periods returned but irregular Last period was months ago Concerned about decreased libido and vaginal dryness Also having some hot flashes for over a year  [facility], [place] P: [phone] F: [place]  PMH / PSH: Asthma, anxiety, depression (followed by [facility]), HTN, prediabetes, tobacco use   Patient Care Team: Zola Button, MD as PCP - General (Family Medicine)   OBJECTIVE:   BP (!) 143/110   Pulse 93   Ht '5\' 5"'$  (1.651 m)   Wt 264 lb 12.8 oz (120.1 kg)   SpO2 100%   BMI 44.07 kg/m   Physical Exam Constitutional:      General: She is not in acute distress. HENT:     Head: Normocephalic and atraumatic.  Cardiovascular:     Rate and Rhythm: Normal rate and regular rhythm.  Pulmonary:     Effort: Pulmonary effort is normal. No respiratory distress.     Breath sounds: Normal breath sounds.  Musculoskeletal:     Cervical back: Neck supple.  Skin:    Comments: Dorsum of right foot wound has closed appears hyperpigmented  Neurological:     Mental Status: She is alert.          01/22/2023    1:28 PM  Depression screen PHQ 2/9  Decreased Interest 0  Down, Depressed, Hopeless 1  PHQ - 2 Score 1  Altered sleeping 0  Tired, decreased energy 1  Change in appetite 0  Feeling bad or failure about yourself  1  Trouble concentrating 0   Moving slowly or fidgety/restless 0  Suicidal thoughts 0  PHQ-9 Score 3  Difficult doing work/chores Somewhat difficult     {Show previous vital signs (optional):23777}    ASSESSMENT/PLAN:   1. Essential hypertension Uncontrolled today secondary to medication nonadherence ran out of one of her medications.  No changes today. - Comprehensive metabolic panel - RN visit BP check in 1 month  2. Prediabetes - Hemoglobin A1c  3. Mixed hyperlipidemia - Lipid Panel  4. Wound of right foot Healing well, advised to continue Vaseline.  Does not need to use bandages anymore.  5. Perimenopause Main concerns are decreased libido and vaginal dryness.  Recommend black cohosh.  Continue vaginal lubricants.  Do not think further workup is needed currently.  She will ask her psychiatrist whether desvenlafaxine can be contributing to decreased libido.  Return in about 6 months (around 07/23/2023) for f/u hypertension.   Zola Button, MD [place]

## 2023-01-22 NOTE — Patient Instructions (Addendum)
It was nice seeing you today!  Try to check your blood pressure at least a few times a week and write these numbers down.  For your foot wound, I recommend that you continue using Vaseline.  You do not need to keep it bandaged anymore.  You can try black cohosh supplement which may help with some of your perimenopausal symptoms and hot flashes.  Schedule a visit for a nurse visit for blood pressure check in 1 month.  Stay well, Zola Button, MD [place] [phone]  --  Make sure to check out at the front desk before you leave today.  Please arrive at least 15 minutes prior to your scheduled appointments.  If you had blood work today, I will send you a MyChart message or a letter if results are normal. Otherwise, I will give you a call.  If you had a referral placed, they will call you to set up an appointment. Please give [place] a call if you don't hear back in the next 2 weeks.  If you need additional refills before your next appointment, please call your pharmacy first.  -- Blood Pressure Record Sheet To take your blood pressure, you will need a blood pressure machine. You can buy a blood pressure machine (blood pressure monitor) at your clinic, drug store, or online. When choosing one, consider: An automatic monitor that has an arm cuff. A cuff that wraps snugly around your upper arm. You should be able to fit only one finger between your arm and the cuff. A device that stores blood pressure reading results. Do not choose a monitor that measures your blood pressure from your wrist or finger. Follow your health care provider's instructions for how to take your blood pressure. To use this form: Take your blood pressure medications every day These measurements should be taken when you have been at rest for at least 10-15 min Take at least 2 readings with each blood pressure check. This makes sure the results are correct. Wait 1-2 minutes between  measurements. Write down the results in the spaces on this form. Keep in mind it should always be recorded systolic over diastolic. Both numbers are important.  Repeat this every day for 2-3 weeks, or as told by your health care provider.  Make a follow-up appointment with your health care provider to discuss the results.  Blood Pressure Log Date Medications taken? (Y/N) Blood Pressure Time of Day

## 2023-01-23 LAB — HEMOGLOBIN A1C
Est. average glucose Bld gHb Est-mCnc: 128 mg/dL
Hgb A1c MFr Bld: 6.1 % — ABNORMAL HIGH (ref 4.8–5.6)

## 2023-01-23 LAB — COMPREHENSIVE METABOLIC PANEL
ALT: 27 IU/L (ref 0–32)
AST: 21 IU/L (ref 0–40)
Albumin/Globulin Ratio: 1.9 (ref 1.2–2.2)
Albumin: 4.4 g/dL (ref 3.9–4.9)
Alkaline Phosphatase: 99 IU/L (ref 44–121)
BUN/Creatinine Ratio: 13 (ref 9–23)
BUN: 11 mg/dL (ref 6–24)
Bilirubin Total: 0.3 mg/dL (ref 0.0–1.2)
CO2: 25 mmol/L (ref 20–29)
Calcium: 9.4 mg/dL (ref 8.7–10.2)
Chloride: 99 mmol/L (ref 96–106)
Creatinine, Ser: 0.84 mg/dL (ref 0.57–1.00)
Globulin, Total: 2.3 g/dL (ref 1.5–4.5)
Glucose: 105 mg/dL — ABNORMAL HIGH (ref 70–99)
Potassium: 3.6 mmol/L (ref 3.5–5.2)
Sodium: 138 mmol/L (ref 134–144)
Total Protein: 6.7 g/dL (ref 6.0–8.5)
eGFR: 86 mL/min/{1.73_m2} (ref >59)

## 2023-01-23 LAB — LIPID PANEL
Chol/HDL Ratio: 3 ratio (ref 0.0–4.4)
Cholesterol, Total: 207 mg/dL — ABNORMAL HIGH (ref 100–199)
HDL: 68 mg/dL (ref >39)
LDL Chol Calc (NIH): 124 mg/dL — ABNORMAL HIGH (ref 0–99)
Triglycerides: 84 mg/dL (ref 0–149)
VLDL Cholesterol Cal: 15 mg/dL (ref 5–40)

## 2023-02-03 ENCOUNTER — Telehealth: Payer: Medicaid Other | Admitting: Nurse Practitioner

## 2023-02-03 DIAGNOSIS — H6990 Unspecified Eustachian tube disorder, unspecified ear: Secondary | ICD-10-CM | POA: Diagnosis not present

## 2023-02-03 NOTE — Progress Notes (Signed)
E-Visit for Upper Respiratory Infection   We are sorry you are not feeling well.  Here is how we plan to help!  Based on what you have shared with me, it looks like you may have a viral upper respiratory infection.  Eustacian tube dysfunction  are caused by a large number of viruses; however, rhinovirus is the most common cause.   Symptoms vary from person to person, with common symptoms including sore throat, cough, fatigue or lack of energy and feeling of general discomfort.  A low-grade fever of up to 100.4 may present, but is often uncommon.  Symptoms vary however, and are closely related to a person's age or underlying illnesses.  The most common symptoms associated with an upper respiratory infection are nasal discharge or congestion, cough, sneezing, headache and pressure in the ears and face.  These symptoms usually persist for about 3 to 10 days, but can last up to 2 weeks.  It is important to know that upper respiratory infections do not cause serious illness or complications in most cases.    Upper respiratory infections can be transmitted from person to person, with the most common method of transmission being a person's hands.  The virus is able to live on the skin and can infect other persons for up to 2 hours after direct contact.  Also, these can be transmitted when someone coughs or sneezes; thus, it is important to cover the mouth to reduce this risk.  To keep the spread of the illness at [place], good hand hygiene is very important.  This is an infection that is most likely caused by a virus. There are no specific treatments other than to help you with the symptoms until the infection runs its course.  We are sorry you are not feeling well.  Here is how we plan to help!   For nasal congestion, you may use an oral decongestants such as Mucinex D or if you have glaucoma or high blood pressure use plain Mucinex.  Saline nasal spray or nasal drops can help and can safely be used as often as  needed for congestion.  For your congestion, I have prescribed Fluticasone nasal spray one spray in each nostril twice a day  The flonase will help clear out eustachian tube that connects inner ear to nose. It will take a couple of days to be effective.   If you do not have a history of heart disease, hypertension, diabetes or thyroid disease, prostate/bladder issues or glaucoma, you may also use Sudafed to treat nasal congestion.  It is highly recommended that you consult with a pharmacist or your primary care physician to ensure this medication is safe for you to take.     If you have a cough, you may use cough suppressants such as Delsym and Robitussin.  If you have glaucoma or high blood pressure, you can also use Coricidin HBP.     If you have a sore or scratchy throat, use a saltwater gargle-  to  teaspoon of salt dissolved in a 4-ounce to 8-ounce glass of warm water.  Gargle the solution for approximately 15-30 seconds and then spit.  It is important not to swallow the solution.  You can also use throat lozenges/cough drops and Chloraseptic spray to help with throat pain or discomfort.  Warm or cold liquids can also be helpful in relieving throat pain.  For headache, pain or general discomfort, you can use Ibuprofen or Tylenol as directed.   Some authorities believe  that zinc sprays or the use of Echinacea may shorten the course of your symptoms.   HOME CARE Only take medications as instructed by your medical team. Be sure to drink plenty of fluids. Water is fine as well as fruit juices, sodas and electrolyte beverages. You may want to stay away from caffeine or alcohol. If you are nauseated, try taking small sips of liquids. How do you know if you are getting enough fluid? Your urine should be a pale yellow or almost colorless. Get rest. Taking a steamy shower or using a humidifier may help nasal congestion and ease sore throat pain. You can place a towel over your head and breathe in  the steam from hot water coming from a faucet. Using a saline nasal spray works much the same way. Cough drops, hard candies and sore throat lozenges may ease your cough. Avoid close contacts especially the very young and the elderly Cover your mouth if you cough or sneeze Always remember to wash your hands.   GET HELP RIGHT AWAY IF: You develop worsening fever. If your symptoms do not improve within 10 days You develop yellow or green discharge from your nose over 3 days. You have coughing fits You develop a severe head ache or visual changes. You develop shortness of breath, difficulty breathing or start having chest pain Your symptoms persist after you have completed your treatment plan  MAKE SURE YOU  Understand these instructions. Will watch your condition. Will get help right away if you are not doing well or get worse.  Thank you for choosing an e-visit.  Your e-visit answers were reviewed by a board certified advanced clinical practitioner to complete your personal care plan. Depending upon the condition, your plan could have included both over the counter or prescription medications.  Please review your pharmacy choice. Make sure the pharmacy is open so you can pick up prescription now. If there is a problem, you may contact your provider through [employer] and have the prescription routed to another pharmacy.  Your safety is important to [place]. If you have drug allergies check your prescription carefully.   For the next 24 hours you can use MyChart to ask questions about today's visit, request a non-urgent call back, or ask for a work or school excuse. You will get an email in the next two days asking about your experience. I hope that your e-visit has been valuable and will speed your recovery.  Tracy Coleman Done, FNP   5-10 minutes spent reviewing and documenting in chart.

## 2023-02-04 ENCOUNTER — Other Ambulatory Visit: Payer: Self-pay | Admitting: Family Medicine

## 2023-02-04 DIAGNOSIS — H6693 Otitis media, unspecified, bilateral: Secondary | ICD-10-CM | POA: Diagnosis not present

## 2023-02-04 DIAGNOSIS — J019 Acute sinusitis, unspecified: Secondary | ICD-10-CM | POA: Diagnosis not present

## 2023-02-05 ENCOUNTER — Other Ambulatory Visit: Payer: Self-pay | Admitting: Family Medicine

## 2023-02-19 ENCOUNTER — Ambulatory Visit: Payer: Medicaid Other | Admitting: Student

## 2023-02-19 NOTE — Progress Notes (Deleted)
  SUBJECTIVE:   CHIEF COMPLAINT / HPI:   Hypertension:   today. Home medications include: Lisinopril, amlodipine, chlorthalidone. She endorses taking these medications as prescribed. {Blank single:19197::"Does","Does not"} check blood pressure at home.*** Diet ***. Exercise ***. Most recent creatinine trend:  Lab Results  Component Value Date   CREATININE 0.84 01/22/2023   CREATININE 0.84 03/14/2022   CREATININE 0.76 10/02/2021   Patient {HAS HAS UTM:54650} had a BMP in the past 1 year.   PERTINENT  PMH / PSH: ***  Past Medical History:  Diagnosis Date   Anxiety    Arthritis    feet oa   Asthma    Atypical chest pain 02/06/2019   Closed fracture of navicular bone of foot 02/07/2019   COVID 07/2020   loss of smell headache body aches x 7 days all symptoms resolved   Depression    Dyspnea    with exertion   GERD (gastroesophageal reflux disease)    Hypertension    Obesity    PTSD (post-traumatic stress disorder)    Wears glasses     Patient Care Team: Zola Button, MD as PCP - General (Family Medicine) OBJECTIVE:  There were no vitals taken for this visit. Physical Exam   ASSESSMENT/PLAN:  There are no diagnoses linked to this encounter.    today. {Blank single:19197::"Well controlled","Poorly controlled"}. Goal of ***. Continue to work on healthy dietary habits and exercise. Follow up in ***.   Medication regimen: ***  No follow-ups on file. Erskine Emery, MD 02/19/2023, 11:57 AM PGY-2, [place] {    This will disappear when note is signed, click to select method of visit    :1}

## 2023-03-05 DIAGNOSIS — F331 Major depressive disorder, recurrent, moderate: Secondary | ICD-10-CM | POA: Diagnosis not present

## 2023-03-05 DIAGNOSIS — F411 Generalized anxiety disorder: Secondary | ICD-10-CM | POA: Diagnosis not present

## 2023-03-05 DIAGNOSIS — F4312 Post-traumatic stress disorder, chronic: Secondary | ICD-10-CM | POA: Diagnosis not present

## 2023-03-05 DIAGNOSIS — F1220 Cannabis dependence, uncomplicated: Secondary | ICD-10-CM | POA: Diagnosis not present

## 2023-03-05 DIAGNOSIS — F1020 Alcohol dependence, uncomplicated: Secondary | ICD-10-CM | POA: Diagnosis not present

## 2023-03-19 ENCOUNTER — Other Ambulatory Visit: Payer: Self-pay | Admitting: Family Medicine

## 2023-03-22 DIAGNOSIS — F411 Generalized anxiety disorder: Secondary | ICD-10-CM | POA: Diagnosis not present

## 2023-03-22 DIAGNOSIS — F1220 Cannabis dependence, uncomplicated: Secondary | ICD-10-CM | POA: Diagnosis not present

## 2023-03-22 DIAGNOSIS — F1020 Alcohol dependence, uncomplicated: Secondary | ICD-10-CM | POA: Diagnosis not present

## 2023-03-22 DIAGNOSIS — F331 Major depressive disorder, recurrent, moderate: Secondary | ICD-10-CM | POA: Diagnosis not present

## 2023-03-22 DIAGNOSIS — F4312 Post-traumatic stress disorder, chronic: Secondary | ICD-10-CM | POA: Diagnosis not present

## 2023-04-12 DIAGNOSIS — F331 Major depressive disorder, recurrent, moderate: Secondary | ICD-10-CM | POA: Diagnosis not present

## 2023-04-12 DIAGNOSIS — F1220 Cannabis dependence, uncomplicated: Secondary | ICD-10-CM | POA: Diagnosis not present

## 2023-04-12 DIAGNOSIS — F411 Generalized anxiety disorder: Secondary | ICD-10-CM | POA: Diagnosis not present

## 2023-04-12 DIAGNOSIS — F4312 Post-traumatic stress disorder, chronic: Secondary | ICD-10-CM | POA: Diagnosis not present

## 2023-04-12 DIAGNOSIS — F1020 Alcohol dependence, uncomplicated: Secondary | ICD-10-CM | POA: Diagnosis not present

## 2023-04-13 ENCOUNTER — Other Ambulatory Visit: Payer: Self-pay | Admitting: Family Medicine

## 2023-04-30 DIAGNOSIS — F411 Generalized anxiety disorder: Secondary | ICD-10-CM | POA: Diagnosis not present

## 2023-04-30 DIAGNOSIS — F1220 Cannabis dependence, uncomplicated: Secondary | ICD-10-CM | POA: Diagnosis not present

## 2023-04-30 DIAGNOSIS — F4312 Post-traumatic stress disorder, chronic: Secondary | ICD-10-CM | POA: Diagnosis not present

## 2023-04-30 DIAGNOSIS — F331 Major depressive disorder, recurrent, moderate: Secondary | ICD-10-CM | POA: Diagnosis not present

## 2023-04-30 DIAGNOSIS — F1020 Alcohol dependence, uncomplicated: Secondary | ICD-10-CM | POA: Diagnosis not present

## 2023-05-11 ENCOUNTER — Ambulatory Visit (HOSPITAL_COMMUNITY)
Admission: EM | Admit: 2023-05-11 | Discharge: 2023-05-11 | Disposition: A | Discharge status: Home / Self Care | Payer: Medicaid Other | Attending: Family Medicine | Admitting: Family Medicine

## 2023-05-11 ENCOUNTER — Encounter (HOSPITAL_COMMUNITY): Payer: Self-pay

## 2023-05-11 DIAGNOSIS — F1220 Cannabis dependence, uncomplicated: Secondary | ICD-10-CM | POA: Diagnosis not present

## 2023-05-11 DIAGNOSIS — M791 Myalgia, unspecified site: Secondary | ICD-10-CM | POA: Diagnosis not present

## 2023-05-11 DIAGNOSIS — I10 Essential (primary) hypertension: Secondary | ICD-10-CM | POA: Diagnosis not present

## 2023-05-11 DIAGNOSIS — F1020 Alcohol dependence, uncomplicated: Secondary | ICD-10-CM | POA: Diagnosis not present

## 2023-05-11 DIAGNOSIS — F411 Generalized anxiety disorder: Secondary | ICD-10-CM | POA: Diagnosis not present

## 2023-05-11 DIAGNOSIS — Z760 Encounter for issue of repeat prescription: Secondary | ICD-10-CM

## 2023-05-11 DIAGNOSIS — R52 Pain, unspecified: Secondary | ICD-10-CM

## 2023-05-11 DIAGNOSIS — F4312 Post-traumatic stress disorder, chronic: Secondary | ICD-10-CM | POA: Diagnosis not present

## 2023-05-11 DIAGNOSIS — M6530 Trigger finger, unspecified finger: Secondary | ICD-10-CM | POA: Diagnosis not present

## 2023-05-11 DIAGNOSIS — F331 Major depressive disorder, recurrent, moderate: Secondary | ICD-10-CM | POA: Diagnosis not present

## 2023-05-11 LAB — POCT URINALYSIS DIP (MANUAL ENTRY)
Bilirubin, UA: NEGATIVE
Glucose, UA: NEGATIVE mg/dL
Ketones, POC UA: NEGATIVE mg/dL
Leukocytes, UA: NEGATIVE
Nitrite, UA: NEGATIVE
Protein Ur, POC: NEGATIVE mg/dL
Spec Grav, UA: 1.025 (ref 1.010–1.025)
Urobilinogen, UA: 0.2 E.U./dL
pH, UA: 7 (ref 5.0–8.0)

## 2023-05-11 MED ORDER — CHLORTHALIDONE 25 MG PO TABS
25.0000 mg | ORAL_TABLET | Freq: Every day | ORAL | 0 refills | Status: DC
Start: 2023-05-11 — End: 2023-07-09

## 2023-05-11 MED ORDER — NAPROXEN 375 MG PO TABS: 375.0000 mg | ORAL_TABLET | Freq: Two times a day (BID) | ORAL | 0 refills | Status: DC

## 2023-05-11 NOTE — Discharge Instructions (Addendum)
Refilled chlorthalidone take as directed.  For generalized pain start naproxen 375 take twice daily.  Urinalysis shows no signs of infection.  Suspect left side pain can be related to muscle strain.  If pain worsens or becomes more severe and does not resolve with naproxen recommend follow-up in the emergency department diagnostic imaging and workup.  I have also included information to follow-up with [facility] for evaluation of trigger finger.

## 2023-05-11 NOTE — ED Provider Notes (Signed)
[facility]    CSN: [id number] Arrival date & time: 05/11/23  1821      History   Chief Complaint Chief Complaint  Patient presents with  . Medication Refill    HPI Tracy Coleman is a 47 y.o. female.   HPI Patient presents today requesting refill of her chlorthalidone 25 mg.  She reports she ran out of her chlorthalidone about a week ago.  On arrival patient's blood pressure is elevated 172/120.  On chart review patient has had previous visits with diastolic pressure greater than 100 and systolic readings greater than 140.  She denies any chest pain or shortness of breath.  She complains of bilateral upper arm tightness feeling as if she has had a recent workout. She report reports also left side pain   Past Medical History:  Diagnosis Date  . Anxiety   . Arthritis    feet oa  . Asthma   . Atypical chest pain 02/06/2019  . Closed fracture of navicular bone of foot 02/07/2019  . COVID 07/2020   loss of smell headache body aches x 7 days all symptoms resolved  . Depression   . Dyspnea    with exertion  . GERD (gastroesophageal reflux disease)   . Hypertension   . Obesity   . PTSD (post-traumatic stress disorder)   . Wears glasses     Patient Active Problem List   Diagnosis Date Noted  . Prediabetes 04/26/2022  . History of cervical dysplasia 01/13/2021  . Tobacco dependence with current use 11/02/2020  . Allergies 05/13/2019  . 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  . Asthma 02/06/2019  . Cervical stenosis (uterine cervix) 01/29/2019  . MDD (major depressive disorder), severe (HCC) 01/03/2019  . Acquired hallux rigidus 10/18/2018  . Arthritis of joint of toe 03/06/2018  . Essential hypertension 05/03/2013  . Morbid obesity (HCC) 05/03/2013  . Anxiety 05/03/2013    Past Surgical History:  Procedure Laterality Date  . ARTHRODESIS METATARSALPHALANGEAL JOINT (MTPJ) Right 04/11/2018   Procedure: ARTHRODESIS METATARSALPHALANGEAL JOINT (MTPJ);   Surgeon: Toni Arthurs, MD;  Location: [facility];  Service: Orthopedics;  Laterality: Right;  . FOOT ARTHRODESIS Right 01/23/2019   Procedure: Open treatment of right navicular nonunion;  Surgeon: Toni Arthurs, MD;  Location: [facility];  Service: Orthopedics;  Laterality: Right;  . HYSTEROSCOPY WITH D & C N/A 02/16/2021   Procedure: HYSTEROSCOPY AND IUD REMOVAL;  Surgeon: Jerene Bears, MD;  Location: [facility];  Service: Gynecology;  Laterality: N/A;  . LEEP     x2. Last one 2013  . OPERATIVE ULTRASOUND N/A 02/16/2021   Procedure: OPERATIVE ULTRASOUND;  Surgeon: Jerene Bears, MD;  Location: [facility];  Service: Gynecology;  Laterality: N/A;    OB History     Gravida  2   Para  1   Term  1   Preterm      AB  1   Living  1      SAB  1   IAB      Ectopic      Multiple      Live Births  1            Home Medications    Prior to Admission medications   Medication Sig Start Date End Date Taking? Authorizing Provider  naproxen (NAPROSYN) 375 MG tablet Take 1 tablet (375 mg total) by mouth 2 (two) times daily. 05/11/23  Yes Bing Neighbors, NP  albuterol (PROVENTIL) (2.5 MG/3ML) 0.083% nebulizer solution Take 3 mLs (2.5 mg total) by nebulization every 6 (six) hours as needed for wheezing or shortness of breath. 10/02/21   Jacalyn Lefevre, MD  amLODipine (NORVASC) 5 MG tablet Take 1 tablet (5 mg total) by mouth daily. 03/20/23   Littie Deeds, MD  budesonide-formoterol [facility]) 80-4.5 MCG/ACT inhaler Inhale 2 puffs into the lungs 2 (two) times daily. Inhale 1 to 2 puffs as needed.  Maximum 12 puffs/day. 12/07/22   Littie Deeds, MD  busPIRone (BUSPAR) 10 MG tablet Take 10 mg by mouth 2 (two) times daily.    [provider]  chlorthalidone (HYGROTON) 25 MG tablet Take 1 tablet (25 mg total) by mouth daily. 05/11/23   Bing Neighbors, NP  cyanocobalamin (VITAMIN B12) 1000 MCG tablet Take 1,000 mcg by  mouth daily.    [provider]  desvenlafaxine (PRISTIQ) 100 MG 24 hr tablet Take 100 mg by mouth daily.    [provider]  fluticasone (FLONASE) 50 MCG/ACT nasal spray Place 1 spray into both nostrils daily. 11/02/20   Littie Deeds, MD  hydrOXYzine (VISTARIL) 50 MG capsule hydroxyzine pamoate 50 mg capsule  TAKE 1 CAPSULE BY MOUTH AT BEDTIME    [provider]  lisinopril (ZESTRIL) 40 MG tablet Take 1 tablet (40 mg total) by mouth daily. 01/22/23   Littie Deeds, MD  VITAMIN D PO Take by mouth.    [provider]  zolpidem (AMBIEN CR) 6.25 MG CR tablet Take 6.25 mg by mouth at bedtime as needed for sleep.    [provider]    Family History Family History  Problem Relation Age of Onset  . Alcohol abuse Mother   . Drug abuse Mother   . Depression Mother   . Early death Mother        pneumonia, age 45  . Hypertension Mother   . Colon polyps Father   . Alcohol abuse Father   . Drug abuse Father   . Diabetes Maternal Grandmother   . Hypertension Maternal Grandmother   . Diabetes Paternal Grandmother   . Hypertension Paternal Grandmother   . Colon cancer Neg Hx   . Esophageal cancer Neg Hx   . Rectal cancer Neg Hx   . Stomach cancer Neg Hx     Social History Social History   Tobacco Use  . Smoking status: Every Day    Packs/day: 0.50    Years: 30.00    Additional pack years: 0.00    Total pack years: 15.00    Types: Cigarettes  . Smokeless tobacco: Never  Vaping Use  . Vaping Use: Never used  Substance Use Topics  . Alcohol use: Yes    Alcohol/week: 5.0 standard drinks of alcohol    Types: 5 Standard drinks or equivalent per week  . Drug use: Yes    Frequency: 7.0 times per week    Types: Marijuana     Allergies   Varenicline   Review of Systems Review of Systems   Physical Exam Triage Vital Signs ED Triage Vitals [05/11/23 1835]  Enc Vitals Group     BP (!) 172/120     Pulse Rate 82     Resp 18     Temp  98.6 F (37 C)     Temp Source Oral     SpO2 97 %     Weight      Height      Head Circumference  Peak Flow      Pain Score 8     Pain Loc      Pain Edu?      Excl. in GC?    No data found.  Updated Vital Signs BP (!) 172/120 (BP Location: Right Arm)   Pulse 82   Temp 98.6 F (37 C) (Oral)   Resp 18   SpO2 97%   Visual Acuity Right Eye Distance:   Left Eye Distance:   Bilateral Distance:    Right Eye Near:   Left Eye Near:    Bilateral Near:     Physical Exam   UC Treatments / Results  Labs (all labs ordered are listed, but only abnormal results are displayed) Labs Reviewed  POCT URINALYSIS DIP (MANUAL ENTRY)    EKG   Radiology No results found.  Procedures Procedures (including critical care time)  Medications Ordered in UC Medications - No data to display  Initial Impression / Assessment and Plan / UC Course  I have reviewed the triage vital signs and the nursing notes.  Pertinent labs & imaging results that were available during my care of the patient were reviewed by me and considered in my medical decision making (see chart for details).     *** Final Clinical Impressions(s) / UC Diagnoses   Final diagnoses:  Medication refill  Elevated blood pressure reading in office with diagnosis of hypertension  Myalgia  Pain of left side of body  Trigger finger of left hand, unspecified finger     Discharge Instructions      Refilled chlorthalidone take as directed.  For generalized pain start naproxen 375 take twice daily.    ED Prescriptions     Medication Sig Dispense Auth. Provider   chlorthalidone (HYGROTON) 25 MG tablet Take 1 tablet (25 mg total) by mouth daily. 90 tablet Bing Neighbors, NP   naproxen (NAPROSYN) 375 MG tablet Take 1 tablet (375 mg total) by mouth 2 (two) times daily. 20 tablet Bing Neighbors, NP      PDMP not reviewed this encounter.

## 2023-05-11 NOTE — ED Triage Notes (Addendum)
Patient reports that she has had left arm tightness x 2 days and the right arm started today. Patient states her "arms feel like she has been working out."  Patient also c/o left side pain and states it started 4 days. Patient denies injury, lifting.   Patient states she has been taking Tylenol 1000 mg at 0800 today.  Patient also requests a medication refill of [place] 25 mg and states she has been out of x 1 week.

## 2023-05-16 ENCOUNTER — Ambulatory Visit (HOSPITAL_COMMUNITY)
Admission: EM | Admit: 2023-05-16 | Discharge: 2023-05-16 | Disposition: A | Discharge status: Home / Self Care | Payer: Medicaid Other | Attending: Emergency Medicine | Admitting: Emergency Medicine

## 2023-05-16 ENCOUNTER — Encounter (HOSPITAL_COMMUNITY): Payer: Self-pay

## 2023-05-16 DIAGNOSIS — R52 Pain, unspecified: Secondary | ICD-10-CM | POA: Diagnosis not present

## 2023-05-16 DIAGNOSIS — F411 Generalized anxiety disorder: Secondary | ICD-10-CM | POA: Diagnosis not present

## 2023-05-16 DIAGNOSIS — F331 Major depressive disorder, recurrent, moderate: Secondary | ICD-10-CM | POA: Diagnosis not present

## 2023-05-16 LAB — POCT URINALYSIS DIP (MANUAL ENTRY)
Bilirubin, UA: NEGATIVE
Glucose, UA: NEGATIVE mg/dL
Ketones, POC UA: NEGATIVE mg/dL
Leukocytes, UA: NEGATIVE
Nitrite, UA: NEGATIVE
Spec Grav, UA: 1.025 (ref 1.010–1.025)
Urobilinogen, UA: 1 E.U./dL — AB
pH, UA: 7 (ref 5.0–8.0)

## 2023-05-16 MED ORDER — CYCLOBENZAPRINE HCL 10 MG PO TABS: 10.0000 mg | ORAL_TABLET | Freq: Two times a day (BID) | ORAL | 0 refills | Status: DC | PRN

## 2023-05-16 MED ORDER — METHYLPREDNISOLONE SODIUM SUCC 125 MG IJ SOLR
INTRAMUSCULAR | Status: AC
  Filled 2023-05-16: qty 2

## 2023-05-16 MED ORDER — METHYLPREDNISOLONE SODIUM SUCC 125 MG IJ SOLR
60.0000 mg | Freq: Once | INTRAMUSCULAR | Status: AC
  Administered 2023-05-16: 60 mg via INTRAMUSCULAR

## 2023-05-16 NOTE — Discharge Instructions (Addendum)
Injection given today is a steroid. This should help with any inflammation that's causing your pain.  You can take the muscle relaxer twice daily. If the medication makes you drowsy, take only at bed time.  Ibuprofen (or naproxen) and/or tylenol alternated  Use hot pad or ice to the area  Avoid heavy lifting and strenuous activity  It may take several days to a week for symptoms to improve.  Please return or follow up with your primary care provider if needed. Go to the emergency department if symptoms worsen

## 2023-05-16 NOTE — ED Triage Notes (Signed)
Pt c/o rt side pain on movement since this am. States took naproxen with no relief. States here last week for lt side pain and still having it.

## 2023-05-16 NOTE — ED Provider Notes (Signed)
[facility]    CSN: [id number] Arrival date & time: 05/16/23  1503     History   Chief Complaint Chief Complaint  Patient presents with   Flank Pain    HPI Tracy Coleman is a 47 y.o. female.  Here with right side pain that started this morning. Located lateral ribs, mid-axillary area. Area seizes up and feels like a spasm. Worse with movement. She had similar pain on the right side last week that went away on its own. Not worse with deep breath. No shortness of breath Not having any urinary symptoms. No problems with BM Denies injury or trauma to the area. Does not do any heavy lifting No abd pain  Past Medical History:  Diagnosis Date   Anxiety    Arthritis    feet oa   Asthma    Atypical chest pain 02/06/2019   Closed fracture of navicular bone of foot 02/07/2019   COVID 07/2020   loss of smell headache body aches x 7 days all symptoms resolved   Depression    Dyspnea    with exertion   GERD (gastroesophageal reflux disease)    Hypertension    Obesity    PTSD (post-traumatic stress disorder)    Wears glasses     Patient Active Problem List   Diagnosis Date Noted   Prediabetes 04/26/2022   History of cervical dysplasia 01/13/2021   Tobacco dependence with current use 11/02/2020   Allergies 05/13/2019   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   Asthma 02/06/2019   Cervical stenosis (uterine cervix) 01/29/2019   MDD (major depressive disorder), severe (HCC) 01/03/2019   Acquired hallux rigidus 10/18/2018   Arthritis of joint of toe 03/06/2018   Essential hypertension 05/03/2013   Morbid obesity (HCC) 05/03/2013   Anxiety 05/03/2013    Past Surgical History:  Procedure Laterality Date   ARTHRODESIS METATARSALPHALANGEAL JOINT (MTPJ) Right 04/11/2018   Procedure: ARTHRODESIS METATARSALPHALANGEAL JOINT (MTPJ);  Surgeon: Toni Arthurs, MD;  Location: [facility];  Service: Orthopedics;  Laterality: Right;   FOOT ARTHRODESIS Right  01/23/2019   Procedure: Open treatment of right navicular nonunion;  Surgeon: Toni Arthurs, MD;  Location: [facility];  Service: Orthopedics;  Laterality: Right;   HYSTEROSCOPY WITH D & C N/A 02/16/2021   Procedure: HYSTEROSCOPY AND IUD REMOVAL;  Surgeon: Jerene Bears, MD;  Location: [facility];  Service: Gynecology;  Laterality: N/A;   LEEP     x2. Last one 2013   OPERATIVE ULTRASOUND N/A 02/16/2021   Procedure: OPERATIVE ULTRASOUND;  Surgeon: Jerene Bears, MD;  Location: [facility];  Service: Gynecology;  Laterality: N/A;    OB History     Gravida  2   Para  1   Term  1   Preterm      AB  1   Living  1      SAB  1   IAB      Ectopic      Multiple      Live Births  1            Home Medications    Prior to Admission medications   Medication Sig Start Date End Date Taking? Authorizing Provider  cyclobenzaprine (FLEXERIL) 10 MG tablet Take 1 tablet (10 mg total) by mouth 2 (two) times daily as needed for muscle spasms. 05/16/23  Yes Anikka Marsan, Lurena Joiner, PA-C  albuterol (PROVENTIL) (2.5 MG/3ML) 0.083% nebulizer solution Take 3 mLs (2.5  mg total) by nebulization every 6 (six) hours as needed for wheezing or shortness of breath. 10/02/21   Jacalyn Lefevre, MD  amLODipine (NORVASC) 5 MG tablet Take 1 tablet (5 mg total) by mouth daily. 03/20/23   Littie Deeds, MD  budesonide-formoterol [facility]) 80-4.5 MCG/ACT inhaler Inhale 2 puffs into the lungs 2 (two) times daily. Inhale 1 to 2 puffs as needed.  Maximum 12 puffs/day. 12/07/22   Littie Deeds, MD  busPIRone (BUSPAR) 10 MG tablet Take 10 mg by mouth 2 (two) times daily.    [provider]  chlorthalidone (HYGROTON) 25 MG tablet Take 1 tablet (25 mg total) by mouth daily. 05/11/23   Bing Neighbors, NP  cyanocobalamin (VITAMIN B12) 1000 MCG tablet Take 1,000 mcg by mouth daily.    [provider]  desvenlafaxine (PRISTIQ) 100 MG 24 hr tablet Take 100 mg by  mouth daily.    [provider]  fluticasone (FLONASE) 50 MCG/ACT nasal spray Place 1 spray into both nostrils daily. 11/02/20   Littie Deeds, MD  hydrOXYzine (VISTARIL) 50 MG capsule hydroxyzine pamoate 50 mg capsule  TAKE 1 CAPSULE BY MOUTH AT BEDTIME    [provider]  lisinopril (ZESTRIL) 40 MG tablet Take 1 tablet (40 mg total) by mouth daily. 01/22/23   Littie Deeds, MD  naproxen (NAPROSYN) 375 MG tablet Take 1 tablet (375 mg total) by mouth 2 (two) times daily. 05/11/23   Bing Neighbors, NP  VITAMIN D PO Take by mouth.    [provider]  zolpidem (AMBIEN CR) 6.25 MG CR tablet Take 6.25 mg by mouth at bedtime as needed for sleep.    [provider]    Family History Family History  Problem Relation Age of Onset   Alcohol abuse Mother    Drug abuse Mother    Depression Mother    Early death Mother        pneumonia, age 29   Hypertension Mother    Colon polyps Father    Alcohol abuse Father    Drug abuse Father    Diabetes Maternal Grandmother    Hypertension Maternal Grandmother    Diabetes Paternal Grandmother    Hypertension Paternal Grandmother    Colon cancer Neg Hx    Esophageal cancer Neg Hx    Rectal cancer Neg Hx    Stomach cancer Neg Hx     Social History Social History   Tobacco Use   Smoking status: Every Day    Packs/day: 0.50    Years: 30.00    Additional pack years: 0.00    Total pack years: 15.00    Types: Cigarettes   Smokeless tobacco: Never  Vaping Use   Vaping Use: Never used  Substance Use Topics   Alcohol use: Yes    Alcohol/week: 5.0 standard drinks of alcohol    Types: 5 Standard drinks or equivalent per week   Drug use: Yes    Frequency: 7.0 times per week    Types: Marijuana     Allergies   Varenicline   Review of Systems Review of Systems As per HPI  Physical Exam Triage Vital Signs ED Triage Vitals [05/16/23 1604]  Enc Vitals Group     BP (!) 152/100     Pulse Rate 88     Resp  20     Temp 98.3 F (36.8 C)     Temp Source Oral     SpO2 98 %     Weight  Height      Head Circumference      Peak Flow      Pain Score 9     Pain Loc      Pain Edu?      Excl. in GC?    No data found.  Updated Vital Signs BP (!) 152/100 (BP Location: Left Arm)   Pulse 88   Temp 98.3 F (36.8 C) (Oral)   Resp 20   SpO2 98%    Physical Exam Vitals and nursing note reviewed.  Constitutional:      General: She is not in acute distress.    Appearance: Normal appearance.  HENT:     Mouth/Throat:     Pharynx: Oropharynx is clear.  Cardiovascular:     Rate and Rhythm: Normal rate and regular rhythm.     Pulses: Normal pulses.     Heart sounds: Normal heart sounds.  Pulmonary:     Effort: Pulmonary effort is normal.     Breath sounds: Normal breath sounds.  Abdominal:     General: There is no distension.     Palpations: Abdomen is soft.     Tenderness: There is no abdominal tenderness. There is no right CVA tenderness, left CVA tenderness, guarding or rebound.  Musculoskeletal:        General: Tenderness present. No deformity.     Cervical back: Normal range of motion.     Comments: Tender to palpation on the right side. Muscular pain over right ribs, mid-axillary. Non tender on the back or abdomen. Patient sitting up on table provoked pain. No palpable deformity or step off  Skin:    General: Skin is warm and dry.     Findings: No bruising, erythema or rash.     Comments: No skin changes over area of pain  Neurological:     Mental Status: She is alert and oriented to person, place, and time.     UC Treatments / Results  Labs (all labs ordered are listed, but only abnormal results are displayed) Labs Reviewed  POCT URINALYSIS DIP (MANUAL ENTRY) - Abnormal; Notable for the following components:      Result Value   Blood, UA trace-intact (*)    Protein Ur, POC trace (*)    Urobilinogen, UA 1.0 (*)    All other components within normal limits     EKG   Radiology No results found.  Procedures Procedures (including critical care time)  Medications Ordered in UC Medications  methylPREDNISolone sodium succinate (SOLU-MEDROL) 125 mg/2 mL injection 60 mg (60 mg Intramuscular Given 05/16/23 1647)    Initial Impression / Assessment and Plan / UC Course  I have reviewed the triage vital signs and the nursing notes.  Pertinent labs & imaging results that were available during my care of the patient were reviewed by me and considered in my medical decision making (see chart for details).  UA trace blood. Actually improved from her visit last week. Low concern for kidney infection or stone given area of pain, reproducible. Most likely muscular. Especially since worse with movement. She has taken naproxen today, cannot give toradol. No history of DM, she is in prediabetes range with A1c 6.1. Will give one dose IM steroid for anti-inflammatory pain. Recommend continue naproxen/tylenol at home. Add flexeril BID prn. Hot pad and rest. Advised return if persisting, ED if worsening. Work note provided. No questions at this time. Patient agreeable to plan, ambulated out of clinic with steady gait  Final Clinical  Impressions(s) / UC Diagnoses   Final diagnoses:  Pain of right side of body     Discharge Instructions      Injection given today is a steroid. This should help with any inflammation that's causing your pain.  You can take the muscle relaxer twice daily. If the medication makes you drowsy, take only at bed time.  Ibuprofen (or naproxen) and/or tylenol alternated  Use hot pad or ice to the area  Avoid heavy lifting and strenuous activity  It may take several days to a week for symptoms to improve.  Please return or follow up with your primary care provider if needed. Go to the emergency department if symptoms worsen     ED Prescriptions     Medication Sig Dispense Auth. Provider   cyclobenzaprine (FLEXERIL) 10 MG  tablet Take 1 tablet (10 mg total) by mouth 2 (two) times daily as needed for muscle spasms. 20 tablet Ryer Asato, Lurena Joiner, PA-C      PDMP not reviewed this encounter.   Kathrine Haddock 05/16/23 1610

## 2023-05-24 DIAGNOSIS — F411 Generalized anxiety disorder: Secondary | ICD-10-CM | POA: Diagnosis not present

## 2023-05-24 DIAGNOSIS — F331 Major depressive disorder, recurrent, moderate: Secondary | ICD-10-CM | POA: Diagnosis not present

## 2023-06-15 DIAGNOSIS — F331 Major depressive disorder, recurrent, moderate: Secondary | ICD-10-CM | POA: Diagnosis not present

## 2023-06-15 DIAGNOSIS — F411 Generalized anxiety disorder: Secondary | ICD-10-CM | POA: Diagnosis not present

## 2023-06-26 DIAGNOSIS — F331 Major depressive disorder, recurrent, moderate: Secondary | ICD-10-CM | POA: Diagnosis not present

## 2023-06-26 DIAGNOSIS — F411 Generalized anxiety disorder: Secondary | ICD-10-CM | POA: Diagnosis not present

## 2023-07-09 ENCOUNTER — Ambulatory Visit: Payer: Medicaid Other | Admitting: Family Medicine

## 2023-07-09 ENCOUNTER — Encounter: Payer: Self-pay | Admitting: Family Medicine

## 2023-07-09 VITALS — BP 141/98 | HR 81 | Ht 65.0 in | Wt 261.8 lb

## 2023-07-09 DIAGNOSIS — J4521 Mild intermittent asthma with (acute) exacerbation: Secondary | ICD-10-CM | POA: Diagnosis not present

## 2023-07-09 DIAGNOSIS — F17200 Nicotine dependence, unspecified, uncomplicated: Secondary | ICD-10-CM

## 2023-07-09 DIAGNOSIS — I10 Essential (primary) hypertension: Secondary | ICD-10-CM | POA: Diagnosis not present

## 2023-07-09 MED ORDER — AMLODIPINE BESYLATE 5 MG PO TABS: 5.0000 mg | ORAL_TABLET | Freq: Every day | ORAL | 2 refills | Status: DC

## 2023-07-09 MED ORDER — LISINOPRIL 40 MG PO TABS: 40.0000 mg | ORAL_TABLET | Freq: Every day | ORAL | 1 refills | Status: DC

## 2023-07-09 MED ORDER — CHLORTHALIDONE 25 MG PO TABS
25.0000 mg | ORAL_TABLET | Freq: Every day | ORAL | 0 refills | Status: AC
Start: 2023-07-09 — End: ?

## 2023-07-09 MED ORDER — BUDESONIDE-FORMOTEROL FUMARATE 80-4.5 MCG/ACT IN AERO: 2.0000 | INHALATION_SPRAY | Freq: Two times a day (BID) | RESPIRATORY_TRACT | 3 refills | Status: DC

## 2023-07-09 NOTE — Assessment & Plan Note (Signed)
Patient declined in depth discussion of barriers to current quit attempt. Previously had set quit date for June 12th but was lost to follow up.

## 2023-07-09 NOTE — Progress Notes (Signed)
    SUBJECTIVE:   CHIEF COMPLAINT / HPI:   Patient presents today for medication refill and check in for chronic conditions.   HTN- Readings today was 129/99 and 141/99. Her diastolic blood pressure is consistently in the 99-100 range. Tolerating her medications well, no complaints or issues. Denies headaches, chest pain or palpitations.  Asthma- doing well, no shortness of breath or recent episodes of wheezing. Tolerating symbicort well.   Briefly addressed smoking cessation attempts. Patient reports some progress but declines further discussion at this time.   PERTINENT  PMH / PSH: HTN, Asthma, Tobacco Use.   OBJECTIVE:   BP (!) 141/98   Pulse 81   Ht 5\' 5"  (1.651 m)   Wt 261 lb 12.8 oz (118.8 kg)   SpO2 100%   BMI 43.57 kg/m   General: A&O, NAD HEENT: No sign of trauma, EOM grossly intact Cardiac: RRR, no m/r/g Respiratory: CTAB, normal WOB, no w/c/r   ASSESSMENT/PLAN:   Essential hypertension Refilled amlodipine, lisinopril and chlorthalidone. BP today was 129/99 and 141/98 on recheck. This does represent an improvement from her last measurement which was 152/100, but is not to goal. If patient continues to have elevated blood pressure readings, consider increase in amlodipine. Repeat BMP 2/25  Asthma Patient doing well on current treatment. Refilled Symbicort. Follow up as needed.   Tobacco dependence with current use Patient declined in depth discussion of barriers to current quit attempt. Previously had set quit date for June 12th but was lost to follow up.    Gerrit Heck, DO [facility]

## 2023-07-09 NOTE — Patient Instructions (Signed)
It was wonderful to see you today!  Today we refilled your amlodipine, lisinopril, chlorthalidone and symbicort. They should be ready for you to pick up later today at your pharmacy.  You can follow up with your new PCP, Dr. Barb Merino, in February, or sooner if needed.   Thank you for choosing [facility].   Please call [phone] with any questions about today's appointment.   If you had blood work today, I will send you a MyChart message or a letter if results are normal. Otherwise, I will give you a call.   If you had a referral placed, they will call you to set up an appointment. Please give [place] a call if you don't hear back in the next 2 weeks.   If you need additional refills before your next appointment, please call your pharmacy first.   Gerrit Heck, DO Family Medicine

## 2023-07-09 NOTE — Assessment & Plan Note (Signed)
Patient doing well on current treatment. Refilled Symbicort. Follow up as needed.

## 2023-07-09 NOTE — Assessment & Plan Note (Signed)
Refilled amlodipine, lisinopril and chlorthalidone. BP today was 129/99 and 141/98 on recheck. This does represent an improvement from her last measurement which was 152/100, but is not to goal. If patient continues to have elevated blood pressure readings, consider increase in amlodipine. Repeat BMP 2/25

## 2023-08-06 DIAGNOSIS — F4312 Post-traumatic stress disorder, chronic: Secondary | ICD-10-CM | POA: Diagnosis not present

## 2023-08-06 DIAGNOSIS — F331 Major depressive disorder, recurrent, moderate: Secondary | ICD-10-CM | POA: Diagnosis not present

## 2023-08-06 DIAGNOSIS — F411 Generalized anxiety disorder: Secondary | ICD-10-CM | POA: Diagnosis not present

## 2023-08-31 ENCOUNTER — Telehealth: Payer: Self-pay | Admitting: Pharmacist

## 2023-08-31 NOTE — Telephone Encounter (Signed)
Contacted patient for follow-up of blood pressure/hypertension control.   Scheduled appointment with Dr. Raymondo Band on 09/12/23.  Total time with patient call and documentation of interaction: 3 minutes.

## 2023-09-12 ENCOUNTER — Ambulatory Visit: Payer: Medicaid Other | Admitting: Pharmacist

## 2023-09-12 ENCOUNTER — Encounter: Payer: Self-pay | Admitting: Pharmacist

## 2023-09-12 VITALS — BP 134/96 | HR 85 | Ht 65.0 in | Wt 258.0 lb

## 2023-09-12 DIAGNOSIS — I10 Essential (primary) hypertension: Secondary | ICD-10-CM | POA: Diagnosis not present

## 2023-09-12 DIAGNOSIS — F17200 Nicotine dependence, unspecified, uncomplicated: Secondary | ICD-10-CM | POA: Diagnosis not present

## 2023-09-12 MED ORDER — EPLERENONE 25 MG PO TABS
25.0000 mg | ORAL_TABLET | Freq: Every day | ORAL | 2 refills | Status: DC
Start: 2023-09-12 — End: 2023-12-13

## 2023-09-12 MED ORDER — OLMESARTAN-AMLODIPINE-HCTZ 40-5-25 MG PO TABS
1.0000 | ORAL_TABLET | Freq: Every day | ORAL | 4 refills | Status: DC
Start: 2023-09-12 — End: 2023-12-31

## 2023-09-12 NOTE — Assessment & Plan Note (Signed)
Tobacco Use Disorder currently smoking 10 cigarettes per day and verbalizes interest in quitting in the future.  At this time patient is reluctant to quit completely due to high levels of stress.  She also smokes 1-5 blunts per day of marijuana.  - Goal - short-term is to try to reduce tobacco smoking to 6 or fewer cigarettes per day.  - Readdress at future visit

## 2023-09-12 NOTE — Progress Notes (Signed)
S:     Chief Complaint  Patient presents with   Medication Management    Blood Pressure    47 y.o. female who presents for hypertension evaluation, education, and management.  PMH is significant for Depression, Anxiety, Asthma, Tobacco Use.  Patient was referred and last seen by Primary Care Provider, Dr. Gerrit Heck, on 07/09/2023.   Today, patient arrives in low spirits and presents without assistance. Denies dizziness, headache, blurred vision.  Reports leg swelling bilaterally.  Patient shared that she has multiple stressors in her life currently.  Her husband is likely to require heart transplant in the near future.  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 She is connected with both a psych medication provider and counselor outside of the [facility]. She was emotional is sharing feelings related to her current stress.   Patient reports hypertension was diagnosed many years ago, likely ~ 10 years per Epic Dx date.   Medication adherence fair but admits being less than 100% adherent at times . Patient has not taken BP medications today but reports taking yesterday AM.   Current antihypertensives include: amlodipine 5mg , chlorthalidone 25mg , lisinopril 40mg    Reported home BP readings: 130s/ 90's but admits does not check often outside of office.    O:  Review of Systems  Cardiovascular:  Positive for leg swelling.  Psychiatric/Behavioral:  Positive for depression. The patient is nervous/anxious.     Physical Exam Constitutional:      Appearance: Normal appearance.  Pulmonary:     Effort: Pulmonary effort is normal.  Musculoskeletal:     Right lower leg: No edema.     Left lower leg: No edema.  Neurological:     Mental Status: She is alert.  Psychiatric:        Mood and Affect: Mood normal.        Behavior: Behavior normal.        Thought Content: Thought content normal.        Judgment: Judgment normal.     Last 3 Office BP  readings: BP Readings from Last 3 Encounters:  09/12/23 (!) 134/96  07/09/23 (!) 141/98  05/16/23 (!) 152/100    BMET    Component Value Date/Time   NA 138 01/22/2023 1424   K 3.6 01/22/2023 1424   CL 99 01/22/2023 1424   CO2 25 01/22/2023 1424   GLUCOSE 105 (H) 01/22/2023 1424   GLUCOSE 120 (H) 03/14/2022 1137   BUN 11 01/22/2023 1424   CREATININE 0.84 01/22/2023 1424   CALCIUM 9.4 01/22/2023 1424   GFRNONAA >60 03/14/2022 1137   GFRAA >60 07/30/2019 1512    Renal function: CrCl cannot be calculated (Patient's most recent lab result is older than the maximum 21 days allowed.).  Clinical ASCVD: No  The 10-year ASCVD risk score (Arnett DK, et al., 2019) is: 2.7%   Values used to calculate the score:     Age: 37 years     Sex: Female     Is Non-Hispanic African American: No     Diabetic: No     Tobacco smoker: Yes     Systolic Blood Pressure: 134 mmHg     Is BP treated: No     HDL Cholesterol: 68 mg/dL     Total Cholesterol: 207 mg/dL  Patient is participating in a Managed Medicaid Plan:  Yes    A/P: Hypertension diagnosed approximately 10 years ago and currently not at goal on current 3 drug regimen.  BP goal < 130/80  mmHg. Medication adherence appears fair. Control is suboptimal due to possible hyperaldosterone driven elevation.  -Switched Amlodipine, Lisinopril and Chlorthalidone - to Triple - three in one with Amlodipine, olmesartan and hydrochlorothiazide.  -Started Eplerenone 25mg  once daily.  -Patient educated on purpose, proper use, and potential adverse effects.  -F/u labs ordered - BMET at next visit planned  Tobacco Use Disorder currently smoking 10 cigarettes per day and verbalizes interest in quitting in the future.  At this time patient is reluctant to quit completely due to high levels of stress.  She also smokes 1-5 blunts per day of marijuana.  - Goal - short-term is to try to reduce tobacco smoking to 6 or fewer cigarettes per day.  - Readdress at  future visit   Results reviewed and written information provided.    Written patient instructions provided. Patient verbalized understanding of treatment plan.  Total time in face to face counseling 32 minutes.    Follow-up:  Pharmacist 09/20/23. PCP clinic visit in PRN.  Patient seen with Caprice Beaver, PharmD Candidate and Shona Simpson, PharmD Candidate.

## 2023-09-12 NOTE — Patient Instructions (Addendum)
It was nice to see you today!  Your goal blood pressure is  <130/80 mmHg.  Medication Changes: START 3 in 1 blood pressure pill once daily, START eplerenone 25mg  once daily   Discontinue amlodipine, lisinopril and chlorthalidone  Continue all other medication the same.   Tobacco goal is 6 cigarettes or less per day.    Monitor blood pressure at home daily and keep a log (on your phone or piece of paper) to bring with you to your next visit. Write down date, time, blood pressure and pulse.  Keep up the good work with diet and exercise. Aim for a diet full of vegetables, fruit and lean meats (chicken, [place], fish). Try to limit salt intake by eating fresh or frozen vegetables (instead of canned), rinse canned vegetables prior to cooking and do not add any additional salt to meals.

## 2023-09-12 NOTE — Assessment & Plan Note (Signed)
Hypertension diagnosed approximately 10 years ago and currently not at goal on current 3 drug regimen.  BP goal < 130/80  mmHg. Medication adherence appears fair. Control is suboptimal due to possible hyperaldosterone driven elevation.  -Switched Amlodipine, Lisinopril and Chlorthalidone - to Triple - three in one with Amlodipine, olmesartan and hydrochlorothiazide.  -Started Eplerenone 25mg  once daily.  -Patient educated on purpose, proper use, and potential adverse effects.  -F/u labs ordered - BMET at next visit planned

## 2023-09-17 NOTE — Progress Notes (Signed)
Reviewed and agree with Dr Koval's plan.   

## 2023-09-20 ENCOUNTER — Ambulatory Visit: Payer: Medicaid Other | Admitting: Pharmacist

## 2023-09-26 DIAGNOSIS — J069 Acute upper respiratory infection, unspecified: Secondary | ICD-10-CM | POA: Diagnosis not present

## 2023-09-26 DIAGNOSIS — R062 Wheezing: Secondary | ICD-10-CM | POA: Diagnosis not present

## 2023-09-27 ENCOUNTER — Observation Stay (HOSPITAL_COMMUNITY)
Admission: EM | Admit: 2023-09-27 | Discharge: 2023-09-29 | Disposition: A | Discharge status: Home / Self Care | Payer: Medicaid Other | Attending: Family Medicine | Admitting: Family Medicine

## 2023-09-27 ENCOUNTER — Encounter (HOSPITAL_COMMUNITY): Payer: Self-pay | Admitting: Emergency Medicine

## 2023-09-27 ENCOUNTER — Emergency Department (HOSPITAL_COMMUNITY): Payer: Medicaid Other

## 2023-09-27 DIAGNOSIS — Z79899 Other long term (current) drug therapy: Secondary | ICD-10-CM | POA: Insufficient documentation

## 2023-09-27 DIAGNOSIS — I10 Essential (primary) hypertension: Secondary | ICD-10-CM | POA: Insufficient documentation

## 2023-09-27 DIAGNOSIS — F1090 Alcohol use, unspecified, uncomplicated: Secondary | ICD-10-CM | POA: Insufficient documentation

## 2023-09-27 DIAGNOSIS — J4541 Moderate persistent asthma with (acute) exacerbation: Secondary | ICD-10-CM | POA: Diagnosis not present

## 2023-09-27 DIAGNOSIS — R0602 Shortness of breath: Secondary | ICD-10-CM | POA: Diagnosis not present

## 2023-09-27 DIAGNOSIS — Z1152 Encounter for screening for COVID-19: Secondary | ICD-10-CM | POA: Diagnosis not present

## 2023-09-27 DIAGNOSIS — J45901 Unspecified asthma with (acute) exacerbation: Secondary | ICD-10-CM | POA: Diagnosis present

## 2023-09-27 DIAGNOSIS — R0600 Dyspnea, unspecified: Secondary | ICD-10-CM | POA: Diagnosis present

## 2023-09-27 DIAGNOSIS — J4531 Mild persistent asthma with (acute) exacerbation: Secondary | ICD-10-CM | POA: Diagnosis not present

## 2023-09-27 DIAGNOSIS — J441 Chronic obstructive pulmonary disease with (acute) exacerbation: Secondary | ICD-10-CM | POA: Diagnosis not present

## 2023-09-27 MED ORDER — ALBUTEROL SULFATE (2.5 MG/3ML) 0.083% IN NEBU
INHALATION_SOLUTION | RESPIRATORY_TRACT | Status: AC
  Administered 2023-09-28: 10 mg via RESPIRATORY_TRACT
  Filled 2023-09-27: qty 12

## 2023-09-27 MED ORDER — IPRATROPIUM-ALBUTEROL 0.5-2.5 (3) MG/3ML IN SOLN
3.0000 mL | Freq: Once | RESPIRATORY_TRACT | Status: AC
  Administered 2023-09-27: 3 mL via RESPIRATORY_TRACT
  Filled 2023-09-27: qty 3

## 2023-09-27 MED ORDER — IPRATROPIUM-ALBUTEROL 0.5-2.5 (3) MG/3ML IN SOLN
3.0000 mL | Freq: Once | RESPIRATORY_TRACT | Status: AC
  Administered 2023-09-27: 3 mL via RESPIRATORY_TRACT

## 2023-09-27 NOTE — ED Triage Notes (Addendum)
Pt has hx of asthma. Started on steroids and antibiotics and albuterol breathing tx at home yesterday. Been using TID for last day and feels chest pain, SOB, back pain, body aches, chills but no fever. Pt has pursed lip breathing. States this usually happens once a year but this seems worse. PT did covid test at home and was negative.

## 2023-09-28 ENCOUNTER — Other Ambulatory Visit (HOSPITAL_COMMUNITY): Payer: Self-pay

## 2023-09-28 ENCOUNTER — Other Ambulatory Visit: Payer: Self-pay

## 2023-09-28 DIAGNOSIS — J45901 Unspecified asthma with (acute) exacerbation: Secondary | ICD-10-CM | POA: Diagnosis present

## 2023-09-28 DIAGNOSIS — F1090 Alcohol use, unspecified, uncomplicated: Secondary | ICD-10-CM | POA: Insufficient documentation

## 2023-09-28 DIAGNOSIS — J4531 Mild persistent asthma with (acute) exacerbation: Principal | ICD-10-CM

## 2023-09-28 LAB — CBC WITH DIFFERENTIAL/PLATELET
Abs Immature Granulocytes: 0.08 10*3/uL — ABNORMAL HIGH (ref 0.00–0.07)
Basophils Absolute: 0 10*3/uL (ref 0.0–0.1)
Basophils Relative: 0 %
Eosinophils Absolute: 0.1 10*3/uL (ref 0.0–0.5)
Eosinophils Relative: 1 %
HCT: 36.9 % (ref 36.0–46.0)
Hemoglobin: 12.4 g/dL (ref 12.0–15.0)
Immature Granulocytes: 1 %
Lymphocytes Relative: 21 %
Lymphs Abs: 3.1 10*3/uL (ref 0.7–4.0)
MCH: 31.3 pg (ref 26.0–34.0)
MCHC: 33.6 g/dL (ref 30.0–36.0)
MCV: 93.2 fL (ref 80.0–100.0)
Monocytes Absolute: 1 10*3/uL (ref 0.1–1.0)
Monocytes Relative: 7 %
Neutro Abs: 10.4 10*3/uL — ABNORMAL HIGH (ref 1.7–7.7)
Neutrophils Relative %: 70 %
Platelets: 350 10*3/uL (ref 150–400)
RBC: 3.96 MIL/uL (ref 3.87–5.11)
RDW: 13.4 % (ref 11.5–15.5)
WBC: 14.7 10*3/uL — ABNORMAL HIGH (ref 4.0–10.5)
nRBC: 0 % (ref 0.0–0.2)

## 2023-09-28 LAB — RESP PANEL BY RT-PCR (RSV, FLU A&B, COVID)  RVPGX2
Influenza A by PCR: NEGATIVE
Influenza B by PCR: NEGATIVE
Resp Syncytial Virus by PCR: NEGATIVE
SARS Coronavirus 2 by RT PCR: NEGATIVE

## 2023-09-28 LAB — HIV ANTIBODY (ROUTINE TESTING W REFLEX): HIV Screen 4th Generation wRfx: NONREACTIVE

## 2023-09-28 LAB — BASIC METABOLIC PANEL
Anion gap: 11 (ref 5–15)
BUN: 13 mg/dL (ref 6–20)
CO2: 25 mmol/L (ref 22–32)
Calcium: 9.1 mg/dL (ref 8.9–10.3)
Chloride: 102 mmol/L (ref 98–111)
Creatinine, Ser: 0.95 mg/dL (ref 0.44–1.00)
GFR, Estimated: >60 mL/min (ref >60)
Glucose, Bld: 120 mg/dL — ABNORMAL HIGH (ref 70–99)
Potassium: 3.5 mmol/L (ref 3.5–5.1)
Sodium: 138 mmol/L (ref 135–145)

## 2023-09-28 LAB — TROPONIN I (HIGH SENSITIVITY): Troponin I (High Sensitivity): 8 ng/L (ref <18)

## 2023-09-28 LAB — D-DIMER, QUANTITATIVE: D-Dimer, Quant: <0.27 ug{FEU}/mL (ref 0.00–0.50)

## 2023-09-28 LAB — LIPASE, BLOOD: Lipase: 19 U/L (ref 11–51)

## 2023-09-28 MED ORDER — MAGNESIUM SULFATE 2 GM/50ML IV SOLN
2.0000 g | Freq: Once | INTRAVENOUS | Status: AC
  Administered 2023-09-28: 2 g via INTRAVENOUS
  Filled 2023-09-28: qty 50

## 2023-09-28 MED ORDER — ALBUTEROL SULFATE (2.5 MG/3ML) 0.083% IN NEBU: 2.5000 mg | INHALATION_SOLUTION | RESPIRATORY_TRACT | Status: DC | PRN

## 2023-09-28 MED ORDER — ALBUTEROL SULFATE (2.5 MG/3ML) 0.083% IN NEBU
5.0000 mg | INHALATION_SOLUTION | Freq: Once | RESPIRATORY_TRACT | Status: AC
  Administered 2023-09-28: 5 mg via RESPIRATORY_TRACT
  Filled 2023-09-28: qty 6

## 2023-09-28 MED ORDER — IPRATROPIUM BROMIDE 0.02 % IN SOLN
0.5000 mg | Freq: Once | RESPIRATORY_TRACT | Status: AC
  Administered 2023-09-28: 0.5 mg via RESPIRATORY_TRACT
  Filled 2023-09-28: qty 2.5

## 2023-09-28 MED ORDER — IPRATROPIUM-ALBUTEROL 0.5-2.5 (3) MG/3ML IN SOLN
3.0000 mL | RESPIRATORY_TRACT | Status: DC
  Administered 2023-09-28 (×4): 3 mL via RESPIRATORY_TRACT
  Filled 2023-09-28 (×4): qty 3

## 2023-09-28 MED ORDER — ACETAMINOPHEN 325 MG PO TABS
650.0000 mg | ORAL_TABLET | Freq: Four times a day (QID) | ORAL | Status: DC | PRN
  Administered 2023-09-28 (×2): 650 mg via ORAL
  Filled 2023-09-28 (×2): qty 2

## 2023-09-28 MED ORDER — UMECLIDINIUM BROMIDE 62.5 MCG/ACT IN AEPB
1.0000 | INHALATION_SPRAY | Freq: Every day | RESPIRATORY_TRACT | Status: DC
  Administered 2023-09-28: 1 via RESPIRATORY_TRACT
  Filled 2023-09-28: qty 7

## 2023-09-28 MED ORDER — HYDROCHLOROTHIAZIDE 25 MG PO TABS
25.0000 mg | ORAL_TABLET | Freq: Every day | ORAL | Status: DC
  Administered 2023-09-28: 25 mg via ORAL
  Filled 2023-09-28 (×2): qty 1

## 2023-09-28 MED ORDER — METHYLPREDNISOLONE SODIUM SUCC 125 MG IJ SOLR
125.0000 mg | Freq: Once | INTRAMUSCULAR | Status: AC
  Administered 2023-09-28: 125 mg via INTRAVENOUS
  Filled 2023-09-28: qty 2

## 2023-09-28 MED ORDER — PREDNISONE 10 MG PO TABS: 50.0000 mg | ORAL_TABLET | Freq: Every day | ORAL | Status: DC

## 2023-09-28 MED ORDER — VENLAFAXINE HCL ER 37.5 MG PO CP24
37.5000 mg | ORAL_CAPSULE | Freq: Every day | ORAL | Status: DC
  Filled 2023-09-28 (×2): qty 1

## 2023-09-28 MED ORDER — HYDROXYZINE HCL 25 MG PO TABS
50.0000 mg | ORAL_TABLET | Freq: Every day | ORAL | Status: DC
  Administered 2023-09-28: 50 mg via ORAL
  Filled 2023-09-28: qty 2

## 2023-09-28 MED ORDER — IPRATROPIUM-ALBUTEROL 0.5-2.5 (3) MG/3ML IN SOLN: 3.0000 mL | Freq: Three times a day (TID) | RESPIRATORY_TRACT | Status: DC

## 2023-09-28 MED ORDER — IPRATROPIUM-ALBUTEROL 0.5-2.5 (3) MG/3ML IN SOLN
3.0000 mL | Freq: Once | RESPIRATORY_TRACT | Status: AC
  Administered 2023-09-28: 3 mL via RESPIRATORY_TRACT
  Filled 2023-09-28: qty 3

## 2023-09-28 MED ORDER — LEVALBUTEROL HCL 0.63 MG/3ML IN NEBU
0.6300 mg | INHALATION_SOLUTION | RESPIRATORY_TRACT | Status: DC | PRN
  Administered 2023-09-29: 0.63 mg via RESPIRATORY_TRACT
  Filled 2023-09-28 (×2): qty 3

## 2023-09-28 MED ORDER — ALBUTEROL SULFATE (2.5 MG/3ML) 0.083% IN NEBU: 10.0000 mg | INHALATION_SOLUTION | Freq: Once | RESPIRATORY_TRACT | Status: AC

## 2023-09-28 MED ORDER — NALTREXONE HCL 50 MG PO TABS
50.0000 mg | ORAL_TABLET | Freq: Every day | ORAL | Status: DC
  Administered 2023-09-28: 50 mg via ORAL
  Filled 2023-09-28 (×2): qty 1

## 2023-09-28 MED ORDER — RAMELTEON 8 MG PO TABS
8.0000 mg | ORAL_TABLET | Freq: Every day | ORAL | Status: DC
  Administered 2023-09-28: 8 mg via ORAL
  Filled 2023-09-28: qty 1

## 2023-09-28 MED ORDER — BUSPIRONE HCL 5 MG PO TABS
15.0000 mg | ORAL_TABLET | Freq: Three times a day (TID) | ORAL | Status: DC
  Administered 2023-09-28 (×3): 15 mg via ORAL
  Filled 2023-09-28 (×3): qty 1

## 2023-09-28 MED ORDER — OLMESARTAN-AMLODIPINE-HCTZ 40-5-25 MG PO TABS: 1.0000 | ORAL_TABLET | Freq: Every day | ORAL | Status: DC

## 2023-09-28 MED ORDER — IRBESARTAN 300 MG PO TABS
300.0000 mg | ORAL_TABLET | Freq: Every day | ORAL | Status: DC
  Administered 2023-09-28: 300 mg via ORAL
  Filled 2023-09-28: qty 1

## 2023-09-28 MED ORDER — FLUTICASONE FUROATE-VILANTEROL 100-25 MCG/ACT IN AEPB
1.0000 | INHALATION_SPRAY | Freq: Every day | RESPIRATORY_TRACT | Status: DC
  Administered 2023-09-28: 1 via RESPIRATORY_TRACT
  Filled 2023-09-28: qty 28

## 2023-09-28 MED ORDER — ENOXAPARIN SODIUM 60 MG/0.6ML IJ SOSY
60.0000 mg | PREFILLED_SYRINGE | INTRAMUSCULAR | Status: DC
  Administered 2023-09-28: 60 mg via SUBCUTANEOUS
  Filled 2023-09-28 (×2): qty 0.6

## 2023-09-28 MED ORDER — IBUPROFEN 200 MG PO TABS
600.0000 mg | ORAL_TABLET | Freq: Four times a day (QID) | ORAL | Status: DC | PRN
  Administered 2023-09-28 (×2): 600 mg via ORAL
  Filled 2023-09-28 (×2): qty 3

## 2023-09-28 MED ORDER — AMLODIPINE BESYLATE 5 MG PO TABS
5.0000 mg | ORAL_TABLET | Freq: Every day | ORAL | Status: DC
  Administered 2023-09-28: 5 mg via ORAL
  Filled 2023-09-28: qty 1

## 2023-09-28 MED ORDER — ENOXAPARIN SODIUM 40 MG/0.4ML IJ SOSY: 40.0000 mg | PREFILLED_SYRINGE | INTRAMUSCULAR | Status: DC

## 2023-09-28 NOTE — Plan of Care (Signed)
[facility] Tonsillectomy/Adenoidectomy, Postoperative PEDS care plan entered in error.

## 2023-09-28 NOTE — Progress Notes (Signed)
OT Cancellation Note  Patient Details Name: Tracy Coleman MRN: [id number] DOB: January 26, 1976   Cancelled Treatment:    Reason Eval/Treat Not Completed: OT screened, no needs identified, will sign off.  Bryceson Grape D Erin Uecker 09/28/2023, 2:20 PM 09/28/2023  RP, OTR/L  Acute Rehabilitation Services  Office:  [phone]

## 2023-09-28 NOTE — ED Provider Notes (Signed)
[place] EMERGENCY DEPARTMENT AT [facility] Provider Note   CSN: [id number] Arrival date & time: 09/27/23  2257     History  Chief Complaint  Patient presents with   Shortness of Breath   Chest Pain    Tracy Coleman is a 47 y.o. female.  The history is provided by the patient and medical records.  Shortness of Breath Associated symptoms: chest pain   Chest Pain Associated symptoms: shortness of breath    47 year old female with history of asthma, hypertension, obesity, GERD, PTSD, depression, presenting to the ED with shortness of breath.  States she usually has flares of her asthma this time every year but this seems much worse than usual.  She was seen at urgent care yesterday and given antibiotics and steroids, however has not had any significant improvement.  She has been doing nebs 3 times daily but continues worsening.  She also had 2 DuoNeb's and hour-long albuterol treatment in triage but still has significant wheezing and pursed lip breathing.  She does report her daughter has been sick with a cold recently and 2 other coworkers as well.  She did a home COVID test a few days ago which was negative.  She denies any prior cardiac history.  She does report pain in her chest and back, feels like tightness.  Home Medications Prior to Admission medications   Medication Sig Start Date End Date Taking? Authorizing Provider  budesonide-formoterol (SYMBICORT) 80-4.5 MCG/ACT inhaler Inhale 2 puffs into the lungs 2 (two) times daily. Inhale 1 to 2 puffs as needed.  Maximum 12 puffs/day. Patient not taking: Reported on 09/12/2023 07/09/23   Gerrit Heck, DO  busPIRone (BUSPAR) 15 MG tablet Take 15 mg by mouth 3 (three) times daily. 04/15/23   [provider]  cyanocobalamin (VITAMIN B12) 1000 MCG tablet Take 1,000 mcg by mouth daily.    [provider]  desvenlafaxine (PRISTIQ) 100 MG 24 hr tablet Take 100 mg by mouth daily.    [provider]   eplerenone (INSPRA) 25 MG tablet Take 1 tablet (25 mg total) by mouth at bedtime. 09/12/23   McDiarmid, Leighton Roach, MD  hydrOXYzine (VISTARIL) 50 MG capsule hydroxyzine pamoate 50 mg capsule  TAKE 1 CAPSULE BY MOUTH AT BEDTIME    [provider]  naltrexone (DEPADE) 50 MG tablet Take 50 mg by mouth daily. 08/25/23   [provider]  Olmesartan-amLODIPine-HCTZ 40-5-25 MG TABS Take 1 tablet by mouth daily. 09/12/23   McDiarmid, Leighton Roach, MD  VITAMIN D PO Take by mouth.    [provider]  zolpidem (AMBIEN CR) 6.25 MG CR tablet Take 6.25 mg by mouth at bedtime as needed for sleep.    [provider]      Allergies    Varenicline    Review of Systems   Review of Systems  Respiratory:  Positive for shortness of breath.   Cardiovascular:  Positive for chest pain.  All other systems reviewed and are negative.   Physical Exam Updated Vital Signs BP 139/86   Pulse 94   Temp 98.7 F (37.1 C) (Axillary)   Resp (!) 27   SpO2 100%   Physical Exam Vitals and nursing note reviewed.  Constitutional:      Appearance: She is well-developed.  HENT:     Head: Normocephalic and atraumatic.  Eyes:     Conjunctiva/sclera: Conjunctivae normal.     Pupils: Pupils are equal, round, and reactive to light.  Cardiovascular:  Rate and Rhythm: Normal rate and regular rhythm.     Heart sounds: Normal heart sounds.  Pulmonary:     Effort: Pulmonary effort is normal. Tachypnea present.     Breath sounds: Wheezing present.     Comments: Diffuse inspiratory and expiratory wheezes throughout, pursed lip breathing, tachypneic Abdominal:     General: Bowel sounds are normal.     Palpations: Abdomen is soft.  Musculoskeletal:        General: Normal range of motion.     Cervical back: Normal range of motion.  Skin:    General: Skin is warm and dry.  Neurological:     Mental Status: She is alert and oriented to person, place, and time.     ED Results / Procedures /  Treatments   Labs (all labs ordered are listed, but only abnormal results are displayed) Labs Reviewed  CBC WITH DIFFERENTIAL/PLATELET - Abnormal; Notable for the following components:      Result Value   WBC 14.7 (*)    Neutro Abs 10.4 (*)    Abs Immature Granulocytes 0.08 (*)    All other components within normal limits  BASIC METABOLIC PANEL - Abnormal; Notable for the following components:   Glucose, Bld 120 (*)    All other components within normal limits  RESP PANEL BY RT-PCR (RSV, FLU A&B, COVID)  [id number]  TROPONIN I (HIGH SENSITIVITY)    EKG None  Radiology DG Chest 2 View  Result Date: 09/28/2023 CLINICAL DATA:  Shortness of breath, chest pain EXAM: CHEST - 2 VIEW COMPARISON:  12/08/2022 FINDINGS: Heart and mediastinal contours are within normal limits. No focal opacities or effusions. No acute bony abnormality. IMPRESSION: No active cardiopulmonary disease. Electronically Signed   By: Charlett Nose M.D.   On: 09/28/2023 00:17    Procedures Procedures    CRITICAL CARE Performed by: Garlon Hatchet   Total critical care time: 45 minutes  Critical care time was exclusive of separately billable procedures and treating other patients.  Critical care was necessary to treat or prevent imminent or life-threatening deterioration.  Critical care was time spent personally by me on the following activities: development of treatment plan with patient and/or surrogate as well as nursing, discussions with consultants, evaluation of patient's response to treatment, examination of patient, obtaining history from patient or surrogate, ordering and performing treatments and interventions, ordering and review of laboratory studies, ordering and review of radiographic studies, pulse oximetry and re-evaluation of patient's condition.   Medications Ordered in ED Medications  ipratropium-albuterol (DUONEB) 0.5-2.5 (3) MG/3ML nebulizer solution 3 mL (3 mLs Nebulization Given 09/27/23 2318)   ipratropium-albuterol (DUONEB) 0.5-2.5 (3) MG/3ML nebulizer solution 3 mL (3 mLs Nebulization Given 09/27/23 2351)  albuterol (PROVENTIL) (2.5 MG/3ML) 0.083% nebulizer solution 10 mg (10 mg Nebulization Given 09/28/23 0005)  methylPREDNISolone sodium succinate (SOLU-MEDROL) 125 mg/2 mL injection 125 mg (125 mg Intravenous Given 09/28/23 0227)  magnesium sulfate IVPB 2 g 50 mL (0 g Intravenous Stopped 09/28/23 0433)  ipratropium (ATROVENT) nebulizer solution 0.5 mg (0.5 mg Nebulization Given 09/28/23 0205)  albuterol (PROVENTIL) (2.5 MG/3ML) 0.083% nebulizer solution 5 mg (5 mg Nebulization Given 09/28/23 0205)    ED Course/ Medical Decision Making/ A&P                                 Medical Decision Making Amount and/or Complexity of Data Reviewed Labs: ordered. Radiology: ordered and independent interpretation performed.  ECG/medicine tests: ordered and independent interpretation performed.  Risk Prescription drug management. Decision regarding hospitalization.   47 year old female presenting to the ED with asthma exacerbation.  History of same but feels worse than prior flares.  Started on abx, steroids, and nebs yesterday from UC but no improvement.  By time of my evaluation she had already completed 3 sequential DuoNeb's and hour-long albuterol neb.  She still has profound inspiratory and expiratory wheezes.  Will give additional nebs, magnesium, Solu-Medrol, will obtain labs and RVP.  Chest x-ray performed and is negative.  Labs as above--leukocytosis but may be steroid-induced.  She is afebrile and nontoxic.  No other infectious symptoms.  Chemistry is reassuring.  Troponin is negative.  COVID/flu screen negative.  5:11 AM Breathing has improved, still has expiratory wheezes.  Attempted to ambulate to bathroom and got incredibly SOB and tachypneic, RR into 30's. She is not acutely hypoxic. We have discussed options of inpatient versus outpatient management, she does not feel  comfortable going home.  I do feel it is reasonable for observation admission as she is not entirely back to baseline.  We will continue scheduled nebs.  Will admit.  Discussed with FP teaching service-- they will admit.  Final Clinical Impression(s) / ED Diagnoses Final diagnoses:  Mild persistent asthma with exacerbation    Rx / DC Orders ED Discharge Orders     None         Garlon Hatchet, PA-C 09/28/23 2956    Maia Plan, MD 10/03/23 1008

## 2023-09-28 NOTE — Assessment & Plan Note (Addendum)
Satting 100% on room air.  CXR negative. BMP unremarkable. WBC elevated to 14.7 in the setting of recently administered IV solumedrol. Troponins negative. Ordered stat CTA PE in setting of pain radiating to the back -- canceled after D-dimer returned negative. Respiratory panel negative.  Afebrile throughout course thus far.  We will maintain current course of treatment and monitor for improvement over the next few days. - Received IV Solu-Medrol today, will restart oral prednisone 10/19 - O2 goals: >90% - Continue Ellipta 100-25 1 puff/day - Continue Duonebs 3 mL every four hours - Continue PRN tylenol 650mg  q6h for pain. - Continue PRN levalbuterol 0.63 mg every 2 hours - Await a.m. CMP/CBC, HIV

## 2023-09-28 NOTE — Evaluation (Signed)
Physical Therapy Brief Evaluation and Discharge Note Patient Details Name: Tracy Coleman MRN: [id number] DOB: Jul 03, 1976 Today's Date: 09/28/2023   History of Present Illness  Tracy Coleman is a 47 year old female admitted 10/17  presented to [facility] ED with dyspnea.  Seen at urgent care 10/17 -- started on antibiotics and steroids, without improvement.  Nebulizer 3 times a day, worsening.  Likely etiologies include: Asthma exacerbation, viral URI. PMH: asthma, COPD (30 TY), multiple sick contacts (viral URI)  Clinical Impression  Pt admitted with above diagnosis.  Pt currently without functional limitationsand will not need acute skilled PT.  D/C PT.            PT Assessment Patient does not need any further PT services  Assistance Needed at Discharge  None    Equipment Recommendations None recommended by PT  Recommendations for Other Services       Precautions/Restrictions Precautions Precautions: Fall Restrictions Weight Bearing Restrictions: No        Mobility  Bed Mobility Rolling: Independent Supine/Sidelying to sit: Independent Sit to supine/sidelying: Independent    Transfers Overall transfer level: Independent                      Ambulation/Gait Ambulation/Gait assistance: Independent Gait Distance (Feet): 110 Feet Assistive device: None Gait Pattern/deviations: WFL(Within Functional Limits) Gait Speed: Pace WFL General Gait Details: No LOB with activity in hallway with challenges.  Home Activity Instructions    Stairs            Modified Rankin (Stroke Patients Only)        Balance Overall balance assessment: Independent                        Pertinent Vitals/Pain PT - Brief Vital Signs All Vital Signs Stable: Yes Pain Assessment Pain Assessment: No/denies pain     Home Living Family/patient expects to be discharged to:: Private residence Living Arrangements: Spouse/significant other Available Help at  Discharge: Family;Available 24 hours/day     Home Equipment: None        Prior Function Level of Independence: Independent      UE/LE Assessment   UE ROM/Strength/Tone/Coordination: WFL    LE ROM/Strength/Tone/Coordination: [facility]      Communication   Communication Communication: No apparent difficulties     Cognition Overall Cognitive Status: Appears within functional limits for tasks assessed/performed       General Comments General comments (skin integrity, edema, etc.): O2 > 92% on Ra with activity.    Exercises     Assessment/Plan    PT Problem List Decreased activity tolerance       PT Visit Diagnosis Muscle weakness (generalized) (M62.81)    No Skilled PT All education completed;Patient at baseline level of functioning;Patient is independent with all acitivity/mobility   Co-evaluation                AMPAC 6 Clicks Help needed turning from your back to your side while in a flat bed without using bedrails?: None Help needed moving from lying on your back to sitting on the side of a flat bed without using bedrails?: None Help needed moving to and from a bed to a chair (including a wheelchair)?: None Help needed standing up from a chair using your arms (e.g., wheelchair or bedside chair)?: None Help needed to walk in hospital room?: None Help needed climbing 3-5 steps with a railing? : None 6 Click Score: 24  End of Session   Activity Tolerance: Patient tolerated treatment well Patient left: in bed;with call bell/phone within reach Nurse Communication: Mobility status PT Visit Diagnosis: Muscle weakness (generalized) (M62.81)     Time: 4098-1191 PT Time Calculation (min) (ACUTE ONLY): 11 min  Charges:   PT Evaluation $PT Eval Low Complexity: 1 Low      Tracy Coleman M,PT Acute Rehab Services [phone]   Tracy Coleman  09/28/2023, 3:18 PM

## 2023-09-28 NOTE — Hospital Course (Addendum)
Tracy Coleman is a 47 y.o.female with a history of HTN, alcohol use, tobacco use, anxiety who was admitted to the [facility] Service at [facility] for asthma exacerbation. Her hospital course is detailed below:  Asthma Exacerbation  Presented to [facility] with dyspnea. CXR overall unrevealing. Labs unremarkable other than leukocytosis (likely secondary to steroid admin). Not on oxygen requirement on admission. Treated with Mag sulfate, IV steroid, duonebs, CAT x1 hour in ED. Still exhibited mild increased work of breathing.  Chest pain to the back rose concern for aortic dissection/PE.  D-dimer negative.  Lipase negative. Resumed prednisone on day 1 of hospital stay.  Improved after 1 day of scheduled breathing treatments.  Other chronic conditions were medically managed with home medications and formulary alternatives as necessary.  PCP Follow-up Recommendations: PFTs outpatient to assess for COPD picture  Alcohol use disorder follow-up

## 2023-09-28 NOTE — Assessment & Plan Note (Addendum)
Last drink ~Sunday.  History of one handle of liquor q. 2 to 3 days.  No obvious signs of withdrawal at this time.   - Lipase ordered ISO back pain, CF pancreatitis? - Continue CIWA protocol every 4 hours

## 2023-09-28 NOTE — ED Notes (Signed)
ED TO INPATIENT HANDOFF REPORT  ED Nurse Name and Phone #: Gustavo Lah, rn [phone]  S Name/Age/Gender Tracy Coleman 47 y.o. female Room/Bed: 042C/042C  Code Status   Code Status: Full Code  Home/SNF/Other Home Patient oriented to: self, place, time, and situation Is this baseline? Yes   Triage Complete: Triage complete  Chief Complaint Asthma exacerbation [J45.901]  Triage Note Pt has hx of asthma. Started on steroids and antibiotics and albuterol breathing tx at home yesterday. Been using TID for last day and feels chest pain, SOB, back pain, body aches, chills but no fever. Pt has pursed lip breathing. States this usually happens once a year but this seems worse. PT did covid test at home and was negative.    Allergies Allergies  Allergen Reactions   Varenicline Other (See Comments)    Chantix - May have contributed to suicidal ideation     Level of Care/Admitting Diagnosis ED Disposition     ED Disposition  Admit   Condition  --   Comment  Hospital Area: [facility] [[id number]]  Level of Care: Med-Surg [16]  May place patient in observation at [facility] or [facility] if equivalent level of care is available:: Yes  Covid Evaluation: Asymptomatic - no recent exposure (last 10 days) testing not required  Diagnosis: Asthma exacerbation [960454]  Admitting Physician: Alfredo Martinez [[id number]]  Attending Physician: Esmeralda Arthur          B Medical/Surgery History Past Medical History:  Diagnosis Date   Anxiety    Arthritis    feet oa   Asthma    Atypical chest pain 02/06/2019   Closed fracture of navicular bone of foot 02/07/2019   COVID 07/2020   loss of smell headache body aches x 7 days all symptoms resolved   Depression    Dyspnea    with exertion   GERD (gastroesophageal reflux disease)    Hypertension    Obesity    PTSD (post-traumatic stress disorder)    Wears glasses    Past Surgical History:  Procedure  Laterality Date   ARTHRODESIS METATARSALPHALANGEAL JOINT (MTPJ) Right 04/11/2018   Procedure: ARTHRODESIS METATARSALPHALANGEAL JOINT (MTPJ);  Surgeon: Toni Arthurs, MD;  Location: [facility];  Service: Orthopedics;  Laterality: Right;   FOOT ARTHRODESIS Right 01/23/2019   Procedure: Open treatment of right navicular nonunion;  Surgeon: Toni Arthurs, MD;  Location: [facility];  Service: Orthopedics;  Laterality: Right;   HYSTEROSCOPY WITH D & C N/A 02/16/2021   Procedure: HYSTEROSCOPY AND IUD REMOVAL;  Surgeon: Jerene Bears, MD;  Location: [facility];  Service: Gynecology;  Laterality: N/A;   LEEP     x2. Last one 2013   OPERATIVE ULTRASOUND N/A 02/16/2021   Procedure: OPERATIVE ULTRASOUND;  Surgeon: Jerene Bears, MD;  Location: [facility];  Service: Gynecology;  Laterality: N/A;     A IV Location/Drains/Wounds Patient Lines/Drains/Airways Status     Active Line/Drains/Airways     Name Placement date Placement time Site Days   Peripheral IV 09/28/23 20 G 1" Anterior;Left;Proximal Forearm 09/28/23  0225  Forearm  less than 1            Intake/Output Last 24 hours  Intake/Output Summary (Last 24 hours) at 09/28/2023 0747 Last data filed at 09/28/2023 0433 Gross per 24 hour  Intake 43.8 ml  Output --  Net 43.8 ml    Labs/Imaging Results for orders placed or performed during  the hospital encounter of 09/27/23 (from the past 48 hour(s))  CBC with Differential     Status: Abnormal   Collection Time: 09/28/23  2:17 AM  Result Value Ref Range   WBC 14.7 (H) 4.0 - 10.5 K/uL   RBC 3.96 3.87 - 5.11 MIL/uL   Hemoglobin 12.4 12.0 - 15.0 g/dL   HCT 40.9 81.1 - 91.4 %   MCV 93.2 80.0 - 100.0 fL   MCH 31.3 26.0 - 34.0 pg   MCHC 33.6 30.0 - 36.0 g/dL   RDW 78.2 95.6 - 21.3 %   Platelets 350 150 - 400 K/uL   nRBC 0.0 0.0 - 0.2 %   Neutrophils Relative % 70 %   Neutro Abs 10.4 (H) 1.7 - 7.7 K/uL   Lymphocytes Relative 21 %    Lymphs Abs 3.1 0.7 - 4.0 K/uL   Monocytes Relative 7 %   Monocytes Absolute 1.0 0.1 - 1.0 K/uL   Eosinophils Relative 1 %   Eosinophils Absolute 0.1 0.0 - 0.5 K/uL   Basophils Relative 0 %   Basophils Absolute 0.0 0.0 - 0.1 K/uL   Immature Granulocytes 1 %   Abs Immature Granulocytes 0.08 (H) 0.00 - 0.07 K/uL    Comment: Performed at [facility], [address], [place], [place] [address]  Basic metabolic panel     Status: Abnormal   Collection Time: 09/28/23  2:17 AM  Result Value Ref Range   Sodium 138 135 - 145 mmol/L   Potassium 3.5 3.5 - 5.1 mmol/L   Chloride 102 98 - 111 mmol/L   CO2 25 22 - 32 mmol/L   Glucose, Bld 120 (H) 70 - 99 mg/dL    Comment: Glucose reference range applies only to samples taken after fasting for at least 8 hours.   BUN 13 6 - 20 mg/dL   Creatinine, Ser 8.46 0.44 - 1.00 mg/dL   Calcium 9.1 8.9 - 96.2 mg/dL   GFR, Estimated >95 >28 mL/min    Comment: (NOTE) Calculated using the CKD-EPI Creatinine Equation (2021)    Anion gap 11 5 - 15    Comment: Performed at [facility], [address], [place], [place] [address]  Resp panel by RT-PCR (RSV, Flu A&B, Covid) Anterior Nasal Swab     Status: None   Collection Time: 09/28/23  2:17 AM   Specimen: Anterior Nasal Swab  Result Value Ref Range   SARS Coronavirus 2 by RT PCR NEGATIVE NEGATIVE   Influenza A by PCR NEGATIVE NEGATIVE   Influenza B by PCR NEGATIVE NEGATIVE    Comment: (NOTE) The Xpert Xpress SARS-CoV-2/FLU/RSV plus assay is intended as an aid in the diagnosis of influenza from Nasopharyngeal swab specimens and should not be used as a sole basis for treatment. Nasal washings and aspirates are unacceptable for Xpert Xpress SARS-CoV-2/FLU/RSV testing.  Fact Sheet for Patients: BloggerCourse.com  Fact Sheet for Healthcare Providers: SeriousBroker.it  This test is not yet approved or cleared by the [place] FDA and has  been authorized for detection and/or diagnosis of SARS-CoV-2 by FDA under an Emergency Use Authorization (EUA). This EUA will remain in effect (meaning this test can be used) for the duration of the COVID-19 declaration under Section 564(b)(1) of the Act, 21 U.S.C. section 360bbb-3(b)(1), unless the authorization is terminated or revoked.     Resp Syncytial Virus by PCR NEGATIVE NEGATIVE    Comment: (NOTE) Fact Sheet for Patients: BloggerCourse.com  Fact Sheet for Healthcare Providers: SeriousBroker.it  This test is not yet approved or cleared by the [place] and has been authorized for detection and/or diagnosis of SARS-CoV-2 by FDA under an Emergency Use Authorization (EUA). This EUA will remain in effect (meaning this test can be used) for the duration of the COVID-19 declaration under Section 564(b)(1) of the Act, 21 U.S.C. section 360bbb-3(b)(1), unless the authorization is terminated or revoked.  Performed at [facility], [address], [place], [place] [address]   Troponin I (High Sensitivity)     Status: None   Collection Time: 09/28/23  2:17 AM  Result Value Ref Range   Troponin I (High Sensitivity) 8 <18 ng/L    Comment: (NOTE) Elevated high sensitivity troponin I (hsTnI) values and significant  changes across serial measurements may suggest ACS but many other  chronic and acute conditions are known to elevate hsTnI results.  Refer to the "Links" section for chest pain algorithms and additional  guidance. Performed at [facility], [address], [place], [place] [address]    DG Chest 2 View  Result Date: 09/28/2023 CLINICAL DATA:  Shortness of breath, chest pain EXAM: CHEST - 2 VIEW COMPARISON:  12/08/2022 FINDINGS: Heart and mediastinal contours are within normal limits. No focal opacities or effusions. No acute bony abnormality. IMPRESSION: No active cardiopulmonary disease.  Electronically Signed   By: Charlett Nose M.D.   On: 09/28/2023 00:17    Pending Labs Unresulted Labs (From admission, onward)     Start     Ordered   09/29/23 0500  CBC  Tomorrow morning,   R        09/28/23 0542   09/29/23 0500  Basic metabolic panel  Tomorrow morning,   R        09/28/23 0542   09/28/23 0616  D-dimer, quantitative  Once,   R        09/28/23 0615   09/28/23 0526  HIV Antibody (routine testing w rflx)  (HIV Antibody (Routine testing w reflex) panel)  Once,   R        09/28/23 0529            Vitals/Pain Today's Vitals   09/28/23 0300 09/28/23 0415 09/28/23 0600 09/28/23 0744  BP: (!) 156/98 139/86 (!) 147/96 (!) 142/81  Pulse: 89 94 98 100  Resp: (!) 29 (!) 27 (!) 25 18  Temp:    98.6 F (37 C)  TempSrc:    Oral  SpO2: 99% 100% 100% 100%  PainSc:        Isolation Precautions No active isolations  Medications Medications  enoxaparin (LOVENOX) injection 40 mg (has no administration in time range)  ipratropium-albuterol (DUONEB) 0.5-2.5 (3) MG/3ML nebulizer solution 3 mL (3 mLs Nebulization Given 09/28/23 0548)  acetaminophen (TYLENOL) tablet 650 mg (has no administration in time range)  predniSONE (DELTASONE) tablet 50 mg (has no administration in time range)  busPIRone (BUSPAR) tablet 15 mg (has no administration in time range)  venlafaxine XR (EFFEXOR-XR) 24 hr capsule 37.5 mg (37.5 mg Oral Patient Refused/Not Given 09/28/23 0718)  hydrOXYzine (ATARAX) tablet 50 mg (has no administration in time range)  naltrexone (DEPADE) tablet 50 mg (has no administration in time range)  irbesartan (AVAPRO) tablet 300 mg (has no administration in time range)  amLODipine (NORVASC) tablet 5 mg (has no administration in time range)  hydrochlorothiazide (HYDRODIURIL) tablet 25 mg (has no administration in time range)  levalbuterol (XOPENEX) nebulizer solution 0.63 mg (has no administration in time  range)  fluticasone furoate-vilanterol (BREO ELLIPTA) 100-25 MCG/ACT 1  puff (has no administration in time range)  umeclidinium bromide (INCRUSE ELLIPTA) 62.5 MCG/ACT 1 puff (has no administration in time range)  ipratropium-albuterol (DUONEB) 0.5-2.5 (3) MG/3ML nebulizer solution 3 mL (3 mLs Nebulization Given 09/27/23 2318)  ipratropium-albuterol (DUONEB) 0.5-2.5 (3) MG/3ML nebulizer solution 3 mL (3 mLs Nebulization Given 09/27/23 2351)  albuterol (PROVENTIL) (2.5 MG/3ML) 0.083% nebulizer solution 10 mg (10 mg Nebulization Given 09/28/23 0005)  methylPREDNISolone sodium succinate (SOLU-MEDROL) 125 mg/2 mL injection 125 mg (125 mg Intravenous Given 09/28/23 0227)  magnesium sulfate IVPB 2 g 50 mL (0 g Intravenous Stopped 09/28/23 0433)  ipratropium (ATROVENT) nebulizer solution 0.5 mg (0.5 mg Nebulization Given 09/28/23 0205)  albuterol (PROVENTIL) (2.5 MG/3ML) 0.083% nebulizer solution 5 mg (5 mg Nebulization Given 09/28/23 0205)    Mobility walks     Focused Assessments Pulmonary Assessment Handoff:  Lung sounds: Bilateral Breath Sounds: Diminished O2 Device: Room Air     R Recommendations: See Admitting Provider Note  Report given to:   Additional Notes:

## 2023-09-28 NOTE — Assessment & Plan Note (Addendum)
Patient has a history of lifelong asthma treated with albuterol rescue inhaler in the past now only managed with Symbicort. Considering character of chest pain and location at center of chest and penetrating through to the back cannot at this time rule out other etiologies for her shortness of breath. -Admit to family medicine service, Dr. Lum Babe is attending -AM BMP/CBC -Tylenol for pain  -Pulm toilet & IS -DuoNebs every 4 hours scheduled -Albuterol every 2 hours as needed -Restart prednisone tomorrow, s/p solumedrol in ED -Keep O2 >90% -Trelegy formulary equivalent  -D-dimer  -Consider workup for PE if no improvement

## 2023-09-28 NOTE — H&P (Addendum)
Hospital Admission History and Physical Service Pager: [phone]  Patient name: Tracy Coleman Medical record number: [id number] Date of Birth: 11/21/1976 Age: 47 y.o. Gender: female  Primary Care Provider: Vonna Drafts, MD Consultants: None  Code Status: FULL  Preferred Emergency Contact:  Contact Information     Name Relation Home Work Mobile   cooley,reese Spouse [phone]        Other Contacts   None on File     Chief Complaint: Dyspnea  Assessment and Plan: Tracy Coleman is a 47 y.o. female presenting with dyspnea.  Differential for presentation of this includes asthma exacerbation, COPD exacerbation, pneumonia, viral upper respiratory infection but also cannot exclude the possibility of PE given nature of chest pain. Cardiac etiology less likely, trops negative, EKG unremarkable. No evidence of aortic dissection with hemodynamically stable patient. Reasoning for most likely diagnoses listed below.  Asthma exacerbation: -Patient's medical history supports -Improvement with CAT and DuoNebs -Diffuse wheezing  -Pattern matches previous exacerbations  COPD exacerbation -30-year history of half pack a day smoking -Change in character of shortness of breath from previous asthma exacerbations  Viral upper respiratory infection: -Daughter was sick with similar illness -Multiple sick contacts at work  Assessment & Plan Mild persistent asthma with exacerbation Patient has a history of lifelong asthma treated with albuterol rescue inhaler in the past now only managed with Symbicort. Considering character of chest pain and location at center of chest and penetrating through to the back cannot at this time rule out other etiologies for her shortness of breath. -Admit to family medicine service, Dr. Lum Babe is attending -AM BMP/CBC -Tylenol for pain  -Pulm toilet & IS -DuoNebs every 4 hours scheduled -Albuterol every 2 hours as needed -Restart prednisone tomorrow, s/p  solumedrol in ED -Keep O2 >90% -Trelegy formulary equivalent  -D-dimer  -Consider workup for PE if no improvement    Chronic and Stable Problems:  Anxiety and depression: Continue home BuSpar 15 mg, Vistaril 50 mg, Effexor 37.5 mg (formulary for Pristiq) Alcohol use disorder: Continue home naltrexone 50 mg, last drink per patient report was Sunday. CIWAs on board  Hypertension: Continue home olmesartan amlodipine HCTZ 40 - 5 - 25 mg   FEN/GI: Regular VTE Prophylaxis: Lovenox  Disposition: Med-Surg  History of Present Illness:  Tracy Coleman is a 47 y.o. female presenting with dyspnea onset Tuesday.  Initially she thought that this was her typical change of seasons asthma exacerbation.  Patient went to urgent care where she was prescribed antibiotics and steroids but has had no relief so has presented to the emergency department.  Symptoms include cough, sneezing present as well. Reports of body aches and intermittent chills as well. Daughter also sick at home and has had several sick coworkers.  Denies fever.  No new pets or allergy triggers she is aware of.  Does not feel like her symptoms feel like her previous exacerbations.  Also reports chest pain worse with deep breathing, coughing.  It is located in the center of her chest and penetrates through to her back.  In the ED, patient received 3 rounds of DuoNebs and 1 hour-long albuterol nebulizer treatment as well as magnesium, Solu-Medrol.  Labs were drawn as well as a respiratory virus panel, only value of note was leukocytosis likely steroid-induced.  Chest x-ray was also performed and was negative.  Negative troponins.  Patient's breathing had improved but not enough for her to feel comfortable, and ambulation exacerbated her shortness of breath.  Review Of Systems:  Per HPI   Pertinent Past Medical History: Persistent asthma HTN Anxiety Alcohol use Tobacco use history Remainder reviewed in history tab.   Pertinent Past  Surgical History: Arthrodesis MTP and Foot  Hysteroscopy  LEEP    Remainder reviewed in history tab.  Pertinent Social History: Tobacco use: Last cigarette Monday, 1/2 PPD previously for 32 years Alcohol use: 1 bottle of liquor (large bottle) would last for three days  Other Substance use: None Lives with husband, daughter, and dog  Pertinent Family History: Asthma in Daughter   Remainder reviewed in history tab.   Important Outpatient Medications: Taking prednisone 20 mg daily from [facility] Symbicort inhaler BuSpar 15 mg, Vistaril 50 mg Naltrexone  Olmesartan/Amlodipine/hydrochlorothiazide  Pristiq Ambien at night  Remainder reviewed in medication history.   Objective: BP 139/86   Pulse 94   Temp 98.7 F (37.1 C) (Axillary)   Resp (!) 27   SpO2 100%  Exam: General: Alert and oriented.  Uncomfortable appearing Eyes: EOMI, noninjected sclera ENTM: Moist mucous membranes, patent nares Cardiovascular: RRR, no M/R/G Respiratory: Good air movement, diffuse expiratory wheezing throughout Extremities: 2+ pulses in all 4 extremities.  Cap refill within normal limits  Labs:  CBC BMET  Recent Labs  Lab 09/28/23 0217  WBC 14.7*  HGB 12.4  HCT 36.9  PLT 350   Recent Labs  Lab 09/28/23 0217  NA 138  K 3.5  CL 102  CO2 25  BUN 13  CREATININE 0.95  GLUCOSE 120*  CALCIUM 9.1    Troponin 8 RVP: Negative EKG: Normal sinus rhythm   Imaging Studies Performed:  Chest x-ray IMPRESSION: No active cardiopulmonary disease.  Gerrit Heck, DO 09/28/2023, 5:26 AM PGY-1, [facility]  FPTS Intern pager: [phone], text pages welcome Secure chat group [facility] Teaching Service   Upper Level Addendum:  I have seen and evaluated this patient along with Dr. Hyacinth Meeker and reviewed the above note, making necessary revisions as appropriate.  I agree with the medical decision making and physical exam as noted above.  Alfredo Martinez,  MD PGY-3 [facility] Residency

## 2023-09-28 NOTE — Progress Notes (Addendum)
Daily Progress Note Intern Pager: [phone]  Patient name: Tracy Coleman Medical record number: [id number] Date of birth: Jun 21, 1976 Age: 47 y.o. Gender: female  Primary Care Provider: Vonna Drafts, MD Consultants: None Code Status: Full  Pt Overview and Major Events to Date:  Tracy Coleman is a 47 year old female with a past medical history of asthma, COPD (30 TY), multiple sick contacts (viral URI) who presented to [facility] ED with dyspnea.  Seen at urgent care 10/17 -- started on antibiotics and steroids, without improvement.  Nebulizer 3 times a day, worsening.  Likely etiologies include: Asthma exacerbation, viral URI.  Status post IV Solu-Medrol in ED 10/18. Assessment & Plan Asthma exacerbation Satting 100% on room air.  CXR negative. BMP unremarkable. WBC elevated to 14.7 in the setting of recently administered IV solumedrol. Troponins negative. Ordered stat CTA PE in setting of pain radiating to the back -- canceled after D-dimer returned negative. Respiratory panel negative.  Afebrile throughout course thus far.  We will maintain current course of treatment and monitor for improvement over the next few days. - Received IV Solu-Medrol today, will restart oral prednisone 10/19 - O2 goals: >90% - Continue Ellipta 100-25 1 puff/day - Continue Duonebs 3 mL every four hours - Continue PRN tylenol 650mg  q6h for pain. - Continue PRN levalbuterol 0.63 mg every 2 hours - Await a.m. CMP/CBC, HIV Alcohol use disorder Last drink ~Sunday.  History of one handle of liquor q. 2 to 3 days.  No obvious signs of withdrawal at this time.   - Lipase ordered ISO back pain, CF pancreatitis? - Continue CIWA protocol every 4 hours  Chronic and Stable Problems   Anxiety and depression: Continue home BuSpar 15 mg, Vistaril 50 mg, Effexor 37.5 mg (formulary for Pristiq) Alcohol use disorder: Continue home naltrexone 50 mg, last drink per patient report was Sunday. CIWAs on board  Hypertension:  Continue home olmesartan amlodipine HCTZ 40 - 5 - 25 mg.  BP 150/116 earlier this morning, consider additional treatment modalities if continues to rise   FEN/GI: Regular VTE Prophylaxis: Lovenox   Subjective:  No acute events overnight.  On interview, patient is mildly short of breath, pursing lips.  Able to converse in full sentences.  Reports dismay at current hospital stay.  Intermittently tearful. Reassured patient that hospital is the appropriate setting for her care, and that she is doing the right thing by quitting smoking for herself and for her family.  Reports pain in chest that radiates to back.  (D-dimer returned negative after this interview.) updated patient on course of current treatment and likely direction of care over the next few days.  No further questions at this time.  Objective:  BP: 150/116 HR: 87 RR: 20 Afebrile.  Satting 100% on room air  Physical Exam:  General: Middle-aged woman laying in hospital bed watching TV, no acute distress Cardiovascular: Regular rate and rhythm, no murmurs rubs or gallops Respiratory: Polyphonic wheezing noted in bilateral lung fields, otherwise CTAB-no rhonchi/rales.  Moderately increased work of breathing, was able to converse in full sentences  Abdomen: Bowel sounds present, no tenderness in 4 quadrants Extremities: Full range of motion.  Laboratory: Most recent CBC Lab Results  Component Value Date   WBC 14.7 (H) 09/28/2023   HGB 12.4 09/28/2023   HCT 36.9 09/28/2023   MCV 93.2 09/28/2023   PLT 350 09/28/2023   Most recent BMP    Latest Ref Rng & Units 09/28/2023    2:17 AM  BMP  Glucose 70 - 99 mg/dL 161   BUN 6 - 20 mg/dL 13   Creatinine 0.96 - 1.00 mg/dL 0.45   Sodium 409 - 811 mmol/L 138   Potassium 3.5 - 5.1 mmol/L 3.5   Chloride 98 - 111 mmol/L 102   CO2 22 - 32 mmol/L 25   Calcium 8.9 - 10.3 mg/dL 9.1     Troponins: Within normal limits Respiratory panel: Negative WBC: 14.7 (likely 2/2 steroid  admin) D-dimer: Negative  Imaging/Diagnostic Tests:  Chest XR (10/17):   IMPRESSION: No active cardiopulmonary disease. 17):  Tracy China, MD 09/28/2023, 8:29 AM  PGY-1, [facility] Family [facility] FPTS Intern pager: [phone], text pages welcome Secure chat group [facility] Teaching Service

## 2023-09-28 NOTE — TOC Benefit Eligibility Note (Addendum)
Patient Product/process development scientist completed.    The patient is insured through [employer].     Ran test claim for Trelegy Ellipta and Requires Prior Authorization  Ran test claim for [employer] and Requires Prior Authorization  Ran test claim for incruse Ellipta 62.5 mcg and 30 day supply copay is $4.00  This test claim was processed through [employer]- copay amounts may vary at other pharmacies due to [employer], or as the patient moves through the different stages of their insurance plan.     Roland Earl, CPHT Pharmacy Technician III Certified Patient Advocate [facility] Patient Advocate Team Direct Number: [phone]  Fax: [phone]

## 2023-09-28 NOTE — ED Notes (Signed)
Report given to nurse on 5c

## 2023-09-28 NOTE — Progress Notes (Signed)
Transition of Care [facility]) - Inpatient Brief Assessment   Patient Details  Name: Suzy Ponte MRN: [id number] Date of Birth: [birth year]  Transition of [facility]) CM/SW Contact:    Janae Bridgeman, RN Phone Number: 09/28/2023, 2:38 PM   Clinical Narrative: Patient admitted for asthma exacerbation.  No TOC needs at this time.   Transition of Care Asessment: Insurance and Status: (P) Insurance coverage has been reviewed Patient has primary care physician: (P) Yes Home environment has been reviewed: (P) From home Prior level of function:: (P) Independent Prior/Current Home Services: (P) No current home services Social Determinants of Health Reivew: (P) SDOH reviewed no interventions necessary Readmission risk has been reviewed: (P) Yes Transition of care needs: (P) no transition of care needs at this time

## 2023-09-29 DIAGNOSIS — J4531 Mild persistent asthma with (acute) exacerbation: Secondary | ICD-10-CM | POA: Diagnosis not present

## 2023-09-29 MED ORDER — IBUPROFEN 200 MG PO TABS: 600.0000 mg | ORAL_TABLET | Freq: Four times a day (QID) | ORAL | Status: DC | PRN

## 2023-09-29 MED ORDER — PREDNISONE 50 MG PO TABS: 50.0000 mg | ORAL_TABLET | Freq: Every day | ORAL | 0 refills | Status: DC

## 2023-09-29 MED ORDER — DOXYCYCLINE HYCLATE 100 MG PO TABS: 100.0000 mg | ORAL_TABLET | Freq: Two times a day (BID) | ORAL | 0 refills | Status: AC

## 2023-09-29 NOTE — Discharge Summary (Signed)
Family Medicine Teaching [facility] Discharge Summary  Patient name: Tracy Coleman Medical record number: [id number] Date of birth: February 19, 1976 Age: 47 y.o. Gender: female Date of Admission: 09/27/2023  Date of Discharge: 09/29/23 Admitting Physician: Alfredo Martinez, MD  Primary Care Provider: Vonna Drafts, MD Consultants: None  Indication for Hospitalization: Shortness of breath/DOE  Discharge Diagnoses/Problem List:  Principal Problem for Admission: Asthma Exacerbation Other Problems addressed during stay:  Principal Problem:   Asthma exacerbation Active Problems:   Alcohol use disorder  Brief Hospital Course:  Tracy Coleman is a 47 y.o.female with a history of HTN, alcohol use, tobacco use, anxiety who was admitted to the [facility] at [facility] for asthma exacerbation. Her hospital course is detailed below:  Asthma Exacerbation  Presented to [facility] with dyspnea. CXR overall unrevealing. Labs unremarkable other than leukocytosis (likely secondary to steroid admin). Not on oxygen requirement on admission. Treated with Mag sulfate, IV steroid, duonebs, CAT x1 hour in ED. Still exhibited mild increased work of breathing.  D-dimer negative.  Lipase negative. Resumed prednisone on day 1 of hospital stay.  Improved after 1 day of scheduled breathing treatments. Was able to ambulate on room air without any hypoxia prior to discharge.   Other chronic conditions were medically managed with home medications and formulary alternatives as necessary.  PCP Follow-up Recommendations: PFTs outpatient to assess for COPD Alcohol use disorder follow-up Continued abstinence from tobacco   Disposition: Home  Discharge Condition: Stable  Discharge Exam:  Vitals:   09/28/23 2020 09/29/23 0012  BP: 102/60 102/60  Pulse: 96 96  Resp: 18 18  Temp: 98.5 F (36.9 C) 98.5 F (36.9 C)  SpO2: 99%    General: Alert, oriented.  No distress Cardiac: RRR, no M/R/G Respiratory: Mild end  expiratory wheezing in the all lung fields, able to ambulate in hallway without hypoxia or distress, normal work of breathing  Neuro: grossly nonfocal, speech normal Psych: normal range of affect, well groomed, speech normal in rate and volume, normal eye contact   Significant Labs and Imaging:  Recent Labs  Lab 09/28/23 0217  WBC 14.7*  HGB 12.4  HCT 36.9  PLT 350   Recent Labs  Lab 09/28/23 0217  NA 138  K 3.5  CL 102  CO2 25  GLUCOSE 120*  BUN 13  CREATININE 0.95  CALCIUM 9.1    Discharge Medications:  Allergies as of 09/29/2023       Reactions   Varenicline Other (See Comments)   Chantix - May have contributed to suicidal ideation        Medication List     STOP taking these medications    amoxicillin-clavulanate 875-125 MG tablet Commonly known as: AUGMENTIN       TAKE these medications    albuterol (2.5 MG/3ML) 0.083% nebulizer solution Commonly known as: PROVENTIL Take 2.5 mg by nebulization 3 (three) times daily as needed.   Ventolin HFA 108 (90 Base) MCG/ACT inhaler Generic drug: albuterol SMARTSIG:2 Puff(s) By Mouth Every 4-6 Hours PRN   budesonide-formoterol 80-4.5 MCG/ACT inhaler Commonly known as: SYMBICORT Inhale 2 puffs into the lungs 2 (two) times daily. Inhale 1 to 2 puffs as needed.  Maximum 12 puffs/day.   busPIRone 15 MG tablet Commonly known as: BUSPAR Take 15 mg by mouth 3 (three) times daily.   cyanocobalamin 1000 MCG tablet Commonly known as: VITAMIN B12 Take 1,000 mcg by mouth daily.   desvenlafaxine 100 MG 24 hr tablet Commonly known as: PRISTIQ Take 100 mg by  mouth daily.   doxycycline 100 MG tablet Commonly known as: VIBRA-TABS Take 1 tablet (100 mg total) by mouth 2 (two) times daily for 5 days.   eplerenone 25 MG tablet Commonly known as: INSPRA Take 1 tablet (25 mg total) by mouth at bedtime.   hydrOXYzine 50 MG capsule Commonly known as: VISTARIL Take 50 mg by mouth at bedtime.   naltrexone 50 MG  tablet Commonly known as: DEPADE Take 50 mg by mouth daily.   Olmesartan-amLODIPine-HCTZ 40-5-25 MG Tabs Take 1 tablet by mouth daily.   predniSONE 50 MG tablet Commonly known as: DELTASONE Take 1 tablet (50 mg total) by mouth daily with breakfast. What changed:  medication strength how much to take when to take this additional instructions   VITAMIN D PO Take by mouth.   zolpidem 6.25 MG CR tablet Commonly known as: AMBIEN CR Take 6.25 mg by mouth at bedtime as needed for sleep.       Discharge Instructions: Please refer to Patient Instructions section of EMR for full details.  Patient was counseled important signs and symptoms that should prompt return to medical care, changes in medications, dietary instructions, activity restrictions, and follow up appointments.   Follow-Up Appointments:  Follow-up Information     Celine Mans, MD Follow up.   Specialty: Family Medicine Why: Monday 10/28 at 2:50pm Contact information: [address] [place] [address] [phone]                 Latrelle Dodrill, MD 09/29/2023, 10:20 PM [facility]

## 2023-09-29 NOTE — Plan of Care (Signed)
  Problem: Education: Goal: Knowledge of General Education information will improve Description: Including pain rating scale, medication(s)/side effects and non-pharmacologic comfort measures Outcome: Progressing   Problem: Clinical Measurements: Goal: Will remain free from infection Outcome: Progressing   Problem: Nutrition: Goal: Adequate nutrition will be maintained Outcome: Progressing   Problem: Elimination: Goal: Will not experience complications related to urinary retention Outcome: Progressing   Problem: Safety: Goal: Ability to remain free from injury will improve Outcome: Progressing   Problem: Skin Integrity: Goal: Risk for impaired skin integrity will decrease Outcome: Progressing

## 2023-09-29 NOTE — Plan of Care (Signed)
Called to bedside as patient was requesting to leave AMA.  She states that she is tired she has gotten no good sleep over night tonight with only about 3 hours and then she was awoken by me going in to examine her.  She expressed frustration that her medications were not on formulary and she felt as if members of the care team were placating her or treating her as if she was stupid.  After discussion with RN and myself about why even though we were discharging her today this would be considered her leaving AGAINST MEDICAL ADVICE and the benefits of staying as well as discussion of the shortcomings the medical system during her stay, she agreed to stay until she could be evaluated by the attending physician in the morning.  I personally apologize for my role in causing her stress as she was very upset about learning she could possibly have COPD on top of asthma.  She is under a lot of stress in her personal life this was poorly timed information.

## 2023-09-29 NOTE — Discharge Instructions (Addendum)
It was a pleasure to care for you in the hospital and we are so glad that you are feeling better!  While in the hospital we treated you for asthma exacerbation.  Although you are feeling better we recommend that you get tested for COPD as this could be the reason that your typical asthma exacerbation was worse.  You will need to have follow-up with her primary care doctor to arrange pulmonary function testing and to follow-up on your hospitalization.  Please take all medications as described in your discharge instruction packet. You should also review your asthma action plan, attached to your discharge packet.   If you experience any of the following symptoms, please return to the hospital: - Chest pain -Shortness of breath -New or worsening wheezing. -New or worsening dyspnea on exertion  For any other questions or concerns or medication refill needs please contact your primary care provider.

## 2023-09-29 NOTE — Progress Notes (Addendum)
Patient discharged.  Patient demanded discharge papers while standing at nurses station.  Charge nurse printed off AVS and handed to patient.  Patient refused shift assessment, discharge instructions and morning medications.  She did not have an IV.    Patient stated "I am leaving now! You are a horrible fkn nurse."  Charge nurse witnessed incident.  Patient walked out to car.

## 2023-10-01 ENCOUNTER — Telehealth: Payer: Self-pay

## 2023-10-01 NOTE — Transitions of Care (Post Inpatient/ED Visit) (Unsigned)
10/01/2023  Name: Tracy Coleman MRN: [id number] DOB: 11/08/1976  Today's TOC FU Call Status: Today's TOC FU Call Status:: Unsuccessful Call (1st Attempt) Unsuccessful Call (1st Attempt) Date: 10/01/23  Attempted to reach the patient regarding the most recent Inpatient/ED visit.  Follow Up Plan: Additional outreach attempts will be made to reach the patient to complete the Transitions of Care (Post Inpatient/ED visit) call.   Signature Karena Addison, LPN [facility] Nurse Health Advisor Direct Dial [phone]

## 2023-10-02 NOTE — Transitions of Care (Post Inpatient/ED Visit) (Signed)
10/02/2023  Name: Tracy Coleman MRN: [id number] DOB: 1976/02/08  Today's TOC FU Call Status: Today's TOC FU Call Status:: Successful TOC FU Call Completed Unsuccessful Call (1st Attempt) Date: 10/01/23 [facility] FU Call Complete Date: 10/02/23 Patient's Name and Date of Birth confirmed.  Transition Care Management Follow-up Telephone Call Date of Discharge: 09/29/23 Discharge Facility: [facility]) Type of Discharge: Inpatient Admission Primary Inpatient Discharge Diagnosis:: [facility] How have you been since you were released from the hospital?: Better Any questions or concerns?: No  Items Reviewed: Did you receive and understand the discharge instructions provided?: Yes Medications obtained,verified, and reconciled?: Yes (Medications Reviewed) Any new allergies since your discharge?: No Dietary orders reviewed?: Yes Do you have support at home?: Yes People in Home: spouse  Medications Reviewed Today: Medications Reviewed Today     Reviewed by Karena Addison, LPN (Licensed Practical Nurse) on 10/02/23 at 1544  Med List Status: <None>   Medication Order Taking? Sig Documenting Provider Last Dose Status Informant  albuterol (PROVENTIL) (2.5 MG/3ML) 0.083% nebulizer solution [id number] No Take 2.5 mg by nebulization 3 (three) times daily as needed. [provider] 09/27/2023 Active Self  budesonide-formoterol (SYMBICORT) 80-4.5 MCG/ACT inhaler [id number] No Inhale 2 puffs into the lungs 2 (two) times daily. Inhale 1 to 2 puffs as needed.  Maximum 12 puffs/day. Gerrit Heck, DO Past Week Active Self           Med Note (CRUTHIS, CHLOE C   Fri Sep 28, 2023  8:52 AM)    busPIRone (BUSPAR) 15 MG tablet [id number] No Take 15 mg by mouth 3 (three) times daily. [provider] 09/27/2023 Active Self  cyanocobalamin (VITAMIN B12) 1000 MCG tablet [id number] No Take 1,000 mcg by mouth daily. [provider] 09/27/2023 Active Self  desvenlafaxine (PRISTIQ) 100 MG 24  hr tablet [id number] No Take 100 mg by mouth daily. [provider] 09/27/2023 Active Self  doxycycline (VIBRA-TABS) 100 MG tablet [id number]  Take 1 tablet (100 mg total) by mouth 2 (two) times daily for 5 days. Darral Dash, DO  Active   eplerenone (INSPRA) 25 MG tablet [id number] No Take 1 tablet (25 mg total) by mouth at bedtime. McDiarmid, Leighton Roach, MD Past Week Active Self  hydrOXYzine (VISTARIL) 50 MG capsule [id number] No Take 50 mg by mouth at bedtime. [provider] Past Week Active Self  naltrexone (DEPADE) 50 MG tablet [id number] No Take 50 mg by mouth daily. [provider] Past Week Active Self  Olmesartan-amLODIPine-HCTZ 40-5-25 MG TABS [id number] No Take 1 tablet by mouth daily. McDiarmid, Leighton Roach, MD Past Week Active Self  predniSONE (DELTASONE) 50 MG tablet [id number]  Take 1 tablet (50 mg total) by mouth daily with breakfast. Darral Dash, DO  Active   VENTOLIN HFA 108 (90 Base) MCG/ACT inhaler [id number] No SMARTSIG:2 Puff(s) By Mouth Every 4-6 Hours PRN [provider] unknown Active Self           Med Note (CARD, AMY L   Fri Sep 28, 2023  1:24 PM) Patient hasn't had to use.   VITAMIN D PO [id number] No Take by mouth. [provider] Past Week Active Self  zolpidem (AMBIEN CR) 6.25 MG CR tablet [id number] No Take 6.25 mg by mouth at bedtime as needed for sleep. [provider] Past Week Active Self            Home Care and Equipment/Supplies: Were Home Health Services Ordered?: NA Any new equipment or medical supplies ordered?: NA  Functional Questionnaire:  Do you need assistance with bathing/showering or dressing?: No Do you need assistance with meal preparation?: No Do you need assistance with eating?: No Do you have difficulty maintaining continence: No Do you need assistance with getting out of bed/getting out of a chair/moving?: No Do you have difficulty managing or taking your medications?: No  Follow up  appointments reviewed: PCP Follow-up appointment confirmed?: Yes Date of PCP follow-up appointment?: 10/08/23 Follow-up Provider: [facility] Follow-up appointment confirmed?: No Reason Specialist Follow-Up Not Confirmed: Patient has Specialist Provider Number and will Call for Appointment Do you need transportation to your follow-up appointment?: No Do you understand care options if your condition(s) worsen?: Yes-patient verbalized understanding    SIGNATURE Karena Addison, LPN [facility] Nurse Health Advisor Direct Dial [phone]

## 2023-10-08 ENCOUNTER — Ambulatory Visit: Payer: Medicaid Other | Admitting: Family Medicine

## 2023-10-08 ENCOUNTER — Encounter: Payer: Self-pay | Admitting: Family Medicine

## 2023-10-08 VITALS — BP 118/87 | HR 89 | Ht 65.0 in | Wt 259.4 lb

## 2023-10-08 DIAGNOSIS — R55 Syncope and collapse: Secondary | ICD-10-CM

## 2023-10-08 DIAGNOSIS — F17200 Nicotine dependence, unspecified, uncomplicated: Secondary | ICD-10-CM | POA: Diagnosis not present

## 2023-10-08 DIAGNOSIS — R42 Dizziness and giddiness: Secondary | ICD-10-CM

## 2023-10-08 DIAGNOSIS — J4521 Mild intermittent asthma with (acute) exacerbation: Secondary | ICD-10-CM | POA: Diagnosis not present

## 2023-10-08 MED ORDER — ALBUTEROL SULFATE (2.5 MG/3ML) 0.083% IN NEBU
2.5000 mg | INHALATION_SOLUTION | Freq: Three times a day (TID) | RESPIRATORY_TRACT | 1 refills | Status: DC | PRN
Start: 2023-10-08 — End: 2023-12-06

## 2023-10-08 NOTE — Assessment & Plan Note (Signed)
Patient has reassuring exam today, has significantly improved and been stable since discharge from the hospital.  Plan below discussed with patient who is agreeable. -Continue albuterol as needed -Symbicort 2 puffs twice daily -Spirometry with Dr. Raymondo Band -Smoking cessation as below

## 2023-10-08 NOTE — Assessment & Plan Note (Signed)
Congratulated on continuing to able to stop smoking.  Offered nicotine replacement

## 2023-10-08 NOTE — Patient Instructions (Addendum)
It was great to see you! Thank you for allowing me to participate in your care!  Our plans for today:  - Please schedule with Dr. Raymondo Band to test you lungs. - Please continue taking your albuterol as needed - Please taking your Symbicort twice daily. - Congrats on continuing to stop smoking.  - Please make an appointment in 1 month to see you your breathing is doing.   Please arrive 15 minutes PRIOR to your next scheduled appointment time! If you do not, this affects OTHER patients' care.  Take care and seek immediate care sooner if you develop any concerns.   Celine Mans, MD, PGY-2 [facility] Family Medicine 3:24 PM 10/08/2023  [facility] Family [facility]

## 2023-10-08 NOTE — Progress Notes (Signed)
    SUBJECTIVE:   CHIEF COMPLAINT / HPI: hospital f/u  Still wheezing. Feels something is triggering her at work. Feels like she occasionally has some lightheadness. Fell last Tuesday on way to bathroom. Feels breathing is overall better. Used all of her albuterol. Was unaware of stopping after 24hrs. Feels like her asthma is worse at work. Has chest tightness that radiates to back with wheezing there. No known allergens at work.  Stopped smoking October 14.  Is not using nicotine patches or other nicotine replacement.  Does not wish to start any at this time.  PCP Follow-up Recommendations per Discharge summary 09/27/23 PFTs outpatient to assess for COPD Alcohol use disorder follow-up Continued abstinence from tobacco  PERTINENT  PMH / PSH: HTN, Asthma, MDD, Tobacco use  OBJECTIVE:   BP 118/87   Pulse 89   Ht 5\' 5"  (1.651 m)   Wt 259 lb 6 oz (117.7 kg)   SpO2 100%   BMI 43.16 kg/m   General: NAD  Neuro: A&O Cardiovascular: RRR, no murmurs, no peripheral edema Respiratory: normal WOB on RA, CTAB, no wheezes, ronchi or rales Extremities: Moving all 4 extremities equally  Orthostatics: Lying 114/83, Sitting 110/72, Standing 124/98  Previous EKG from 09/28/23 Reviewed.  ASSESSMENT/PLAN:   Assessment & Plan Intermittent asthma with acute exacerbation, unspecified asthma severity Patient has reassuring exam today, has significantly improved and been stable since discharge from the hospital.  Plan below discussed with patient who is agreeable. -Continue albuterol as needed -Symbicort 2 puffs twice daily -Spirometry with Dr. Raymondo Band -Smoking cessation as below Postural dizziness with presyncope Suspected presyncopal episode is likely related to lightheadedness due to excess albuterol use.  There is reassuringly no evidence of cardiac etiology and she has reassuringly regular rhythm and rate on exam today.  Orthostatics are also negative.  Discussed only using albuterol as needed  from now on.  Patient verbalized understanding and she is agreeable with the plan.  Counseled on return precautions.  If further presyncopal events, would order BMP, mag, CBC, repeat EKG, consider echo. Tobacco dependence with current use Congratulated on continuing to able to stop smoking.  Offered nicotine replacement  Return in about 1 month (around 11/08/2023).  Celine Mans, MD [facility]

## 2023-10-11 ENCOUNTER — Ambulatory Visit: Payer: Medicaid Other | Admitting: Pharmacist

## 2023-10-11 ENCOUNTER — Encounter: Payer: Self-pay | Admitting: Pharmacist

## 2023-10-11 VITALS — BP 125/91 | HR 82 | Ht 65.0 in | Wt 264.4 lb

## 2023-10-11 DIAGNOSIS — F17200 Nicotine dependence, unspecified, uncomplicated: Secondary | ICD-10-CM

## 2023-10-11 DIAGNOSIS — J4521 Mild intermittent asthma with (acute) exacerbation: Secondary | ICD-10-CM

## 2023-10-11 DIAGNOSIS — I10 Essential (primary) hypertension: Secondary | ICD-10-CM | POA: Diagnosis not present

## 2023-10-11 NOTE — Progress Notes (Signed)
Reviewed and agree with Dr Koval's plan.   

## 2023-10-11 NOTE — Assessment & Plan Note (Signed)
Tobacco Use disorder chronic, quit smoking on 09/24/2023. Prior cigarette use of 10 cigarettes/day, and 1-5 blunts per day of marijuana. Patient stopped cigarette smoking without NRT. Husband still smokes but is working to stop, Patient has been able to avoid relapsing despite life stressors, goal of continued cessation.

## 2023-10-11 NOTE — Assessment & Plan Note (Signed)
Patient with history of Asthma, who experienced SOB prompting ED visit 0/17/2024 and taking Symbicort (budesonide-formoterol) 80-4.5 mcg/act 2 puffs 2 times daily, albuterol 0.083% nebulizer solution 3 mL TID prn, albuterol 108 mcg/act inhaler 2 puffs prn. Patient with good inhaler technique, uses spacer. Patient medication adherence okay, hasn't used Symbicort inhaler in a few days, last needed nebulizer last week. Presented today for PFTs, due to continued recovery from asthma exacerbation PFTs were deferred until 12/16/2022. -Continued Symbicort (budesonide-formoterol) 80-4.5 mcg/act 2 puffs 2 times daily -Continued albuterol rescue inhaler 2 puffs prn -Continued albuterol 0.083% nebulizer solution 3 mL TID prn -Educated patient on purpose, proper use, potential adverse effects including risk of esophageal candidiasis and need to rinse mouth after each use.

## 2023-10-11 NOTE — Progress Notes (Signed)
S:       Chief Complaint  Patient presents with   Medication Management    PFTs - Deferred; Tobacco Cessation; HTN   47 y.o. female who presents for diabetes evaluation, education, and management. Patient arrives in okay spirits and presents without any assistance. Patient is accompanied by her husband, Jeanella Anton. Patient has not smoked a cigarette since 09/24/2023. Hospital admission on 09/27/2023 for asthma exacerbation. Patient got sick from daughter or coworker which prompted chest/back pain and SOB, causing her presentation to ED.Managed with 3 day course of prednisone 50 gm once daily, which she has finished. Reports at her work they have cleaned the carpet and got an air purifier and she is breathing better at work. Still sbujectively feels like she is wheezing and continues coughing today.   Patient was referred and last seen by Primary Care Provider, Dr. Velna Ochs, on 10/08/2023.   PMH is significant for Asthma, Tobacco Use Disorder, HTN.   Patient reports breathing has been "pretty good" - reporting breathing 6-7/10.  Not yet recovered from recent asthma exacerbation.  Patient reports adherence to medications, except her Symbicort inhaler. Uses albuterol nebulizer 3 times daily as needed and albuterol inhaler 2 puffs as needed Patient reports last dose of medications was Saturday 10/06/2023 Current medications: Symbicort (budesonide-formoterol) 80-4.5 mcg/act 2 puffs twice daily, albuterol 0.083% nebulizer solution 3 mL TID prn, albuterol 108 mcg/act inhaler 2 puffs prn Patient exacerbation hx: Yearly exacerbations requiring prednisone due to weather changes, particularly cold weather   O: Review of Systems  All other systems reviewed and are negative.   Physical Exam Vitals reviewed.  Constitutional:      Appearance: Normal appearance.  Pulmonary:     Effort: Pulmonary effort is normal.     Breath sounds: Wheezing present.  Neurological:     Mental Status: She is alert.   Psychiatric:        Mood and Affect: Mood normal.        Behavior: Behavior normal.        Thought Content: Thought content normal.        Judgment: Judgment normal.     Vitals:   10/11/23 0936 10/11/23 0939  BP: (!) 137/91 (!) 125/91  Pulse: 82   SpO2: 100%    Patient is participating in a Managed Medicaid Plan:  Yes  A/P: Patient with history of Asthma, who experienced SOB prompting ED visit 0/17/2024 and taking Symbicort (budesonide-formoterol) 80-4.5 mcg/act 2 puffs 2 times daily, albuterol 0.083% nebulizer solution 3 mL TID prn, albuterol 108 mcg/act inhaler 2 puffs prn. Patient with good inhaler technique, uses spacer. Patient medication adherence okay, hasn't used Symbicort inhaler in a few days, last needed nebulizer last week. Presented today for PFTs, due to continued recovery from asthma exacerbation PFTs were deferred until 12/16/2022. -Continued Symbicort (budesonide-formoterol) 80-4.5 mcg/act 2 puffs 2 times daily -Continued albuterol rescue inhaler 2 puffs prn -Continued albuterol 0.083% nebulizer solution 3 mL TID prn -Educated patient on purpose, proper use, potential adverse effects including risk of esophageal candidiasis and need to rinse mouth after each use.    Tobacco Use disorder chronic, quit smoking on 09/24/2023. Prior cigarette use of 10 cigarettes/day, and 1-5 blunts per day of marijuana. Patient stopped cigarette smoking without NRT. Husband still smokes but is working to stop, Patient has been able to avoid relapsing despite life stressors, goal of continued cessation.   Hypertension diagnosed ~ 10 years ago and near control, recent hospitalization, breathing symptoms, and life stressors may  be elevating BP. Medication adherence appears good.  - Continued olmesartan-amlodipine-hydrochlorothiazide 40-5-25 mg once daily, eplerenone 25 mg once daily  Written patient instructions provided.   Total time in face to face counseling 29 minutes.    Follow-up:   Pharmacist 12/16/2022 for PFTs PCP clinic visit PRN Patient seen with Shona Simpson, PharmD Candidate.

## 2023-10-11 NOTE — Patient Instructions (Addendum)
It was nice to see you today!  Medication Changes: CONTINUE using Symbicort inhaler 2 puffs 2 times daily CONTINUE using albuterol rescue inhaler 2 puffs as needed  Rinse out mouth with water and spit after using Symbicort inhaler  Continue all other medication the same.   Reschedule PFT appointment with Dr. Raymondo Band once lung function has improved more.

## 2023-10-11 NOTE — Assessment & Plan Note (Signed)
Hypertension diagnosed ~ 10 years ago and near control, recent hospitalization, breathing symptoms, and life stressors may be elevating BP. Medication adherence appears good.  - Continued olmesartan-amlodipine-hydrochlorothiazide 40-5-25 mg once daily, eplerenone 25 mg once daily

## 2023-11-29 DIAGNOSIS — F411 Generalized anxiety disorder: Secondary | ICD-10-CM | POA: Diagnosis not present

## 2023-11-29 DIAGNOSIS — F331 Major depressive disorder, recurrent, moderate: Secondary | ICD-10-CM | POA: Diagnosis not present

## 2023-11-29 DIAGNOSIS — Z5181 Encounter for therapeutic drug level monitoring: Secondary | ICD-10-CM | POA: Diagnosis not present

## 2023-12-05 ENCOUNTER — Emergency Department: Payer: Medicaid Other

## 2023-12-05 ENCOUNTER — Other Ambulatory Visit: Payer: Self-pay

## 2023-12-05 DIAGNOSIS — R062 Wheezing: Secondary | ICD-10-CM | POA: Diagnosis not present

## 2023-12-05 DIAGNOSIS — R0602 Shortness of breath: Secondary | ICD-10-CM | POA: Diagnosis not present

## 2023-12-05 DIAGNOSIS — J4541 Moderate persistent asthma with (acute) exacerbation: Secondary | ICD-10-CM | POA: Insufficient documentation

## 2023-12-05 MED ORDER — ALBUTEROL SULFATE HFA 108 (90 BASE) MCG/ACT IN AERS
2.0000 | INHALATION_SPRAY | RESPIRATORY_TRACT | Status: DC | PRN
  Administered 2023-12-05: 2 via RESPIRATORY_TRACT
  Filled 2023-12-05: qty 6.7

## 2023-12-05 NOTE — ED Triage Notes (Signed)
Pt reports wheezing x2 days. Pt reports h/x asthma and has tried her home inhalers and nebs with little relief.

## 2023-12-06 ENCOUNTER — Emergency Department
Admission: EM | Admit: 2023-12-06 | Discharge: 2023-12-06 | Disposition: A | Discharge status: Home / Self Care | Payer: Medicaid Other | Attending: Emergency Medicine | Admitting: Emergency Medicine

## 2023-12-06 DIAGNOSIS — R062 Wheezing: Secondary | ICD-10-CM | POA: Diagnosis not present

## 2023-12-06 DIAGNOSIS — J4541 Moderate persistent asthma with (acute) exacerbation: Secondary | ICD-10-CM

## 2023-12-06 MED ORDER — IPRATROPIUM-ALBUTEROL 0.5-2.5 (3) MG/3ML IN SOLN
3.0000 mL | Freq: Once | RESPIRATORY_TRACT | Status: AC
  Administered 2023-12-06: 3 mL via RESPIRATORY_TRACT
  Filled 2023-12-06: qty 9

## 2023-12-06 MED ORDER — PREDNISONE 20 MG PO TABS
60.0000 mg | ORAL_TABLET | ORAL | Status: AC
  Administered 2023-12-06: 60 mg via ORAL
  Filled 2023-12-06: qty 3

## 2023-12-06 MED ORDER — IPRATROPIUM-ALBUTEROL 0.5-2.5 (3) MG/3ML IN SOLN
3.0000 mL | Freq: Once | RESPIRATORY_TRACT | Status: AC
  Administered 2023-12-06: 3 mL via RESPIRATORY_TRACT

## 2023-12-06 MED ORDER — ALBUTEROL SULFATE (2.5 MG/3ML) 0.083% IN NEBU: 2.5000 mg | INHALATION_SOLUTION | RESPIRATORY_TRACT | 2 refills | Status: DC | PRN

## 2023-12-06 MED ORDER — ALBUTEROL SULFATE (2.5 MG/3ML) 0.083% IN NEBU
5.0000 mg | INHALATION_SOLUTION | Freq: Once | RESPIRATORY_TRACT | Status: AC
  Administered 2023-12-06: 5 mg via RESPIRATORY_TRACT
  Filled 2023-12-06: qty 6

## 2023-12-06 MED ORDER — PREDNISONE 20 MG PO TABS: 60.0000 mg | ORAL_TABLET | Freq: Every day | ORAL | 0 refills | Status: AC

## 2023-12-06 NOTE — ED Provider Notes (Signed)
[facility] Provider Note    Event Date/Time   First MD Initiated Contact with Patient 12/06/23 0214     (approximate)   History   Wheezing   HPI Tracy Coleman is a 47 y.o. female who presents for shortness of breath and wheezing.  She has had asthma since she was a child and she has been using her inhaler at home but despite this she has been gradually getting worse over the last few days.  She said it started either because of the recent cold weather or because she came in contact with someone with a lot of perfume which can also exacerbate her asthma.  The wheezing and shortness of breath has been gradually worsening over the last few days.  No recent fever, no chest pain, no nausea or vomiting.  It feels like prior asthma exacerbations.  She has not been on steroids for about 2 months but she was admitted during that visit.     Physical Exam   Triage Vital Signs: ED Triage Vitals  Encounter Vitals Group     BP 12/05/23 2344 126/83     Systolic BP Percentile --      Diastolic BP Percentile --      Pulse Rate 12/05/23 2344 96     Resp 12/05/23 2344 (!) 26     Temp 12/05/23 2344 98.5 F (36.9 C)     Temp Source 12/05/23 2344 Oral     SpO2 12/05/23 2344 97 %     Weight 12/05/23 2340 120.2 kg (265 lb)     Height 12/05/23 2340 1.651 m (5\' 5" )     Head Circumference --      Peak Flow --      Pain Score 12/05/23 2340 6     Pain Loc --      Pain Education --      Exclude from Growth Chart --     Most recent vital signs: Vitals:   12/06/23 0255 12/06/23 0300  BP: 96/66   Pulse: 90 93  Resp: (!) 25 14  Temp: 98 F (36.7 C)   SpO2: 98% 98%    General: Awake, mild respiratory distress at rest. CV:  Good peripheral perfusion.  No tachycardia, regular rate. Resp:  Mildly increased respiratory effort with some slight accessory muscle usage and frequent lip pursing breaths.  Expiratory wheezing auscultated throughout lung fields. Abd:  No  distention.    ED Results / Procedures / Treatments   Labs (all labs ordered are listed, but only abnormal results are displayed) Labs Reviewed - No data to display   EKG  ED ECG REPORT I, Loleta Rose, the attending physician, personally viewed and interpreted this ECG.  Date: 12/05/2023 EKG Time: 23: 48 Rate: 94 Rhythm: normal sinus rhythm QRS Axis: normal Intervals: normal ST/T Wave abnormalities: normal Narrative Interpretation: no evidence of acute ischemia    RADIOLOGY I viewed and interpreted the patient's two-view chest x-ray and I see no evidence of pneumonia nor pneumothorax.  I also read the radiologist's report, which confirmed no acute findings.   PROCEDURES:  Critical Care performed: No  Procedures    IMPRESSION / MDM / ASSESSMENT AND PLAN / ED COURSE  I reviewed the triage vital signs and the nursing notes.                              Differential diagnosis includes, but is not limited  to, asthma exacerbation, viral illness, pneumonia, pneumothorax, less likely PE.  Patient's presentation is most consistent with acute presentation with potential threat to life or bodily function.  Labs/studies ordered: EKG, two-view chest x-ray  Interventions/Medications given:  Medications  ipratropium-albuterol (DUONEB) 0.5-2.5 (3) MG/3ML nebulizer solution 3 mL (3 mLs Nebulization Given 12/06/23 0232)  ipratropium-albuterol (DUONEB) 0.5-2.5 (3) MG/3ML nebulizer solution 3 mL (3 mLs Nebulization Given 12/06/23 0232)  ipratropium-albuterol (DUONEB) 0.5-2.5 (3) MG/3ML nebulizer solution 3 mL (3 mLs Nebulization Given 12/06/23 0231)  predniSONE (DELTASONE) tablet 60 mg (60 mg Oral Given 12/06/23 0232)  albuterol (PROVENTIL) (2.5 MG/3ML) 0.083% nebulizer solution 5 mg (5 mg Nebulization Given 12/06/23 0410)    (Note:  hospital course my include additional interventions and/or labs/studies not listed above.)   Signs and symptoms consistent with asthma  exacerbation.  Will begin treatment with DuoNebs x 3 and prednisone 60 mg p.o. and then reassess.  Chest x-ray and EKG are reassuring.  Patient agrees with the plan.     Clinical Course as of 12/06/23 0502  Thu Dec 06, 2023  0356 Reassessed the patient.  She says she is feeling a little bit better upon auscultation she has decreased wheezing but still has some and expiratory wheezing and "squeaks".  She would like another treatment I think that is reasonable so I ordered another albuterol 5 mg. [CF]  0500 The patient reports she feels much better and is ready to go home.  I gave her a note for work for 2 days and encouraged close outpatient follow-up.  Prescriptions as listed below.  I gave my usual and customary return precautions. [CF]    Clinical Course User Index [CF] Loleta Rose, MD     FINAL CLINICAL IMPRESSION(S) / ED DIAGNOSES   Final diagnoses:  Moderate persistent asthma with exacerbation     Rx / DC Orders   ED Discharge Orders          Ordered    predniSONE (DELTASONE) 20 MG tablet  Daily with breakfast        12/06/23 0501    albuterol (PROVENTIL) (2.5 MG/3ML) 0.083% nebulizer solution  Every 4 hours PRN        12/06/23 0501             Note:  This document was prepared using Dragon voice recognition software and may include unintentional dictation errors.   Loleta Rose, MD 12/06/23 [phone]

## 2023-12-06 NOTE — Discharge Instructions (Addendum)
We believe that your symptoms are caused today by an exacerbation of your asthma.  Please take the prescribed medications and any medications that you have at home.  Follow up with your doctor as recommended.  If you develop any new or worsening symptoms, including but not limited to fever, persistent vomiting, worsening shortness of breath, or other symptoms that concern you, please return to the Emergency Department immediately.  

## 2023-12-06 NOTE — ED Notes (Signed)
Pt and family provided water and television remote pt aox4 speaking in full clear sentences on RA hx asthma

## 2023-12-13 ENCOUNTER — Other Ambulatory Visit: Payer: Self-pay | Admitting: Family Medicine

## 2023-12-13 DIAGNOSIS — I10 Essential (primary) hypertension: Secondary | ICD-10-CM

## 2023-12-17 ENCOUNTER — Encounter: Payer: Self-pay | Admitting: Pharmacist

## 2023-12-17 ENCOUNTER — Ambulatory Visit: Payer: Medicaid Other | Admitting: Pharmacist

## 2023-12-17 VITALS — BP 128/86 | HR 94 | Ht 64.0 in | Wt 272.4 lb

## 2023-12-17 DIAGNOSIS — F17200 Nicotine dependence, unspecified, uncomplicated: Secondary | ICD-10-CM | POA: Diagnosis not present

## 2023-12-17 DIAGNOSIS — J4521 Mild intermittent asthma with (acute) exacerbation: Secondary | ICD-10-CM | POA: Diagnosis not present

## 2023-12-17 DIAGNOSIS — I10 Essential (primary) hypertension: Secondary | ICD-10-CM | POA: Diagnosis not present

## 2023-12-17 NOTE — Progress Notes (Signed)
   S:   Chief Complaint  Patient presents with   Medication Management    Breathing - Shortness of Breath   48 y.o. female who presents for evaluation/assistance of PFT, Hypertension and tobacco use disorder.  PMH is significant for shortness of breath, and asthma exacerbations.  Last seen in Emergency Room 12/26 for asthma exacerbation.    Patient was referred and last seen by Primary Care Provider, Dr. Alba, on 10/08/2023.  Last visit in pharmacist clinic 10/11/2023.   At last visit, reinforced need for adherence with blood pressure medications and asthma inhaler - symbicort .   Patient reports complete abstinence since 09/24/2023 and rates CONFIDENCE of remaining quit from tobacco on 1-10 scale of 10.  Most common triggers to use tobacco include; stress, post meals - worries about gaining weight.    Motivation to quit: healthier, breathing better.   O:  Review of Systems  Respiratory:  Positive for shortness of breath.        Congestion  Musculoskeletal:  Positive for back pain.    Physical Exam Vitals reviewed.  Constitutional:      Appearance: Normal appearance.  Pulmonary:     Effort: Pulmonary effort is normal.  Neurological:     Mental Status: She is alert. Mental status is at baseline.  Psychiatric:        Mood and Affect: Mood normal.        Behavior: Behavior normal.        Thought Content: Thought content normal.   Patient is participating in a Managed Medicaid Plan:  Yes  A/P: Tobacco use disorder - reporting complete tobacco cessation since 10/14.  She remains a great candidate for long-term success because of her commitment to remain smoke-free (10/10).  Husband continues to smoke BUT is now smoking only outdoors (no more smoking in the house) and has reduced intake to 5 cigarettes per day.  Concern for weight gain following tobacco cessation was recognized and addressed.   - Discussed strategies for stress, dietary intake moderation/adjustment  strategies to mitigate weight gain.  - Continue to support and encourage long-term tobacco cessation at future visits.  Patient verbalized goal of working to lose weight during 2025.  She shared a weight loss goal of 100lbs for 2025.  We discussed progress and shorter-term goals during her process.   Shortness of breath/Asthma - scheduled for PFT today.  Remains non-adherent with symbicort  (budesonide /formoterol ) maintenance / reliever therapy.   Due to recent exacerbation, patient agreed to delay PFT testing until she is closer to her breathing baseline AND after she improves her Symbicort  adherence.   Hypertension much improved control with TWO pill (four drug) regimen.  Today blood pressure in office was found to be very close to goal with second readings of 128/86.  - Continue current regimen Eplerenone  25mg  daily Olmesartan -amlodipine -hydrochlorothiazide  40/5/25 mg daily Continue to reassess at future visits.   Written patient instructions provided. Patient verbalized understanding of treatment plan.  Total time in face to face counseling 27 minutes.    Follow-up:  Pharmacist 01/15/2024 PCP clinic visit 12/24/2023 - Dr. Romelle

## 2023-12-17 NOTE — Assessment & Plan Note (Signed)
 Shortness of breath/Asthma - scheduled for PFT today.  Remains non-adherent with symbicort  (budesonide /formoterol ) maintenance / reliever therapy.   Due to recent exacerbation, patient agreed to delay PFT testing until she is closer to her breathing baseline AND after she improves her Symbicort  adherence.

## 2023-12-17 NOTE — Patient Instructions (Addendum)
 Great to see you today!  Your goal blood pressure is <130/80 mmHg. Your pressure today was very close to our goal.   Medication Changes: Continue all blood pressure medications the same.   START taking your Symbicort  2 puffs twice daily AND then 2 puffs as needed as well.   Keep going with being a NON-Smoker!!!  You are doing great.   Keep up the good work with diet and exercise. Aim for a diet full of vegetables, fruit and lean meats (chicken, turkey, fish). Try to limit salt intake by eating fresh or frozen vegetables (instead of canned), rinse canned vegetables prior to cooking and do not add any additional salt to meals.

## 2023-12-17 NOTE — Assessment & Plan Note (Signed)
 Hypertension much improved control with TWO pill (four drug) regimen.  Today blood pressure in office was found to be very close to goal with second readings of 128/86.  - Continue current regimen Eplerenone  25mg  daily Olmesartan -amlodipine -hydrochlorothiazide  40/5/25 mg daily Continue to reassess at future visits.

## 2023-12-17 NOTE — Progress Notes (Signed)
 Reviewed and agree with Dr Macky Lower plan.

## 2023-12-17 NOTE — Assessment & Plan Note (Addendum)
 Tobacco use disorder - reporting complete tobacco cessation since 10/14.  She remains a great candidate for long-term success because of her commitment to remain smoke-free (10/10).  Husband continues to smoke BUT is now smoking only outdoors (no more smoking in the house) and has reduced intake to 5 cigarettes per day.  Concern for weight gain following tobacco cessation was recognized and addressed.   - Discussed strategies for stress, dietary intake moderation/adjustment strategies to mitigate weight gain.  - Continue to support and encourage long-term tobacco cessation at future visits.  Patient verbalized goal of working to lose weight during 2025.  She shared a weight loss goal of 100lbs for 2025.  We discussed progress and shorter-term goals during her process.

## 2023-12-24 ENCOUNTER — Ambulatory Visit: Payer: Medicaid Other | Admitting: Family Medicine

## 2023-12-31 ENCOUNTER — Ambulatory Visit: Payer: Medicaid Other | Admitting: Family Medicine

## 2023-12-31 ENCOUNTER — Encounter: Payer: Self-pay | Admitting: Family Medicine

## 2023-12-31 VITALS — BP 148/114 | HR 99 | Ht 64.0 in | Wt 268.4 lb

## 2023-12-31 DIAGNOSIS — I10 Essential (primary) hypertension: Secondary | ICD-10-CM

## 2023-12-31 DIAGNOSIS — J4521 Mild intermittent asthma with (acute) exacerbation: Secondary | ICD-10-CM

## 2023-12-31 DIAGNOSIS — B354 Tinea corporis: Secondary | ICD-10-CM | POA: Diagnosis present

## 2023-12-31 MED ORDER — OLMESARTAN-AMLODIPINE-HCTZ 40-5-25 MG PO TABS: 1.0000 | ORAL_TABLET | Freq: Every day | ORAL | 4 refills | Status: DC

## 2023-12-31 MED ORDER — KETOCONAZOLE 2 % EX CREA: 1.0000 | TOPICAL_CREAM | Freq: Every day | CUTANEOUS | 0 refills | Status: AC

## 2023-12-31 MED ORDER — BUDESONIDE-FORMOTEROL FUMARATE 80-4.5 MCG/ACT IN AERO: 2.0000 | INHALATION_SPRAY | Freq: Two times a day (BID) | RESPIRATORY_TRACT | 3 refills | Status: AC

## 2023-12-31 MED ORDER — EPLERENONE 25 MG PO TABS: 25.0000 mg | ORAL_TABLET | Freq: Every day | ORAL | 1 refills | Status: AC

## 2023-12-31 NOTE — Patient Instructions (Signed)
Please use ketoconazole cream daily for your rash  Continue to use this for about 1 week after your rash resolves  If no improvement after 3-4 weeks, let us know  Please keep a close eye and her blood pressure and let us know if you are having consistent readings above 140/90

## 2023-12-31 NOTE — Assessment & Plan Note (Signed)
BP elevated today likely in the setting of anxiety per patient.  It seems her home BP readings as well as past readings here in the office have been normal overall.  I feel she is well-controlled and is currently on 4 different agents.  Feel she is stable for follow-up on 2/4 with Dr. Raymondo Band.  Advised continued home monitoring and discussed return precautions

## 2023-12-31 NOTE — Progress Notes (Signed)
    SUBJECTIVE:   CHIEF COMPLAINT / HPI:   Needs medication refills for hypertension and asthma.  Also with concern of rash   Hypertension On eplerenone as well as olmesartan-amlodipine-HCTZ combination pill Has overall been well-controlled and follows with Dr. Raymondo Band Reports home BP readings in the 120s-130s/80s States she is a bit anxious today because of her sensitivity to fragrances, and she smelled a lot of perfume coming into clinic today Denies shortness of breath, chest pain, headache, vision changes  Asthma On Symbicort 2 puffs twice daily, doing well on this.  Denies any wheezing or shortness of breath.  Has not needed rescue inhaler recently but has this available.  Rash on right arm and left breast present x 2 weeks Itchy, red skin, raised rash.  Not painful Denies new soaps or detergents, denies sharing towels or other hygiene products with family -Has been trying over-the-counter hydrocortisone itch cream without much relief   PERTINENT  PMH / PSH: Hypertension, asthma  OBJECTIVE:   BP (!) 148/114   Pulse 99   Ht 5\' 4"  (1.626 m)   Wt 268 lb 6.4 oz (121.7 kg)   SpO2 100%   BMI 46.07 kg/m   General: NAD, pleasant, able to participate in exam Cardiac: RRR, no murmurs auscultated Respiratory: CTAB, normal WOB Abdomen: soft, non-tender, non-distended, normoactive bowel sounds Extremities: warm and well perfused, no edema or cyanosis Skin: 3-4 cm raised, well-circumscribed, slightly erythematous lesions present on right forearm as well as left breast.  Nontender to palpation. Neuro: alert, no obvious focal deficits, speech normal Psych: Normal affect and mood  ASSESSMENT/PLAN:   Assessment & Plan Tinea corporis Rash appears most consistent with tinea corporis given 2 isolated lesions and lack of improvement/worsening with hydrocortisone.  Rx ketoconazole cream daily, advised to follow-up if rash does not resolve in 3-4 weeks.  Advised to continue ketoconazole x  1 week after rash resolves Essential hypertension BP elevated today likely in the setting of anxiety per patient.  It seems her home BP readings as well as past readings here in the office have been normal overall.  I feel she is well-controlled and is currently on 4 different agents.  Feel she is stable for follow-up on 2/4 with Dr. Raymondo Band.  Advised continued home monitoring and discussed return precautions   Vonna Drafts, MD [facility]

## 2024-01-15 ENCOUNTER — Ambulatory Visit: Payer: Medicaid Other | Admitting: Pharmacist

## 2024-01-21 ENCOUNTER — Telehealth: Payer: Self-pay | Admitting: Pharmacist

## 2024-01-21 NOTE — Telephone Encounter (Signed)
 Attempted to contact patient for follow-up of PFT   Left HIPAA compliant voice mail requesting call back to direct phone: [phone]  Total time with patient call and documentation of interaction: 2 minutes.

## 2024-02-04 ENCOUNTER — Telehealth: Payer: Self-pay | Admitting: Pharmacist

## 2024-02-04 NOTE — Telephone Encounter (Signed)
 Attempted to contact patient for follow-up of PFT   Left HIPAA compliant voice mail requesting call back to direct phone: [phone]  Total time with patient call and documentation of interaction: 2 minutes.

## 2024-08-27 ENCOUNTER — Other Ambulatory Visit (HOSPITAL_COMMUNITY): Payer: Self-pay

## 2024-08-27 ENCOUNTER — Encounter (HOSPITAL_COMMUNITY): Payer: Self-pay | Admitting: Emergency Medicine

## 2024-08-27 ENCOUNTER — Ambulatory Visit (HOSPITAL_COMMUNITY)
Admission: EM | Admit: 2024-08-27 | Discharge: 2024-08-27 | Disposition: A | Discharge status: Home / Self Care | Attending: Internal Medicine | Admitting: Internal Medicine

## 2024-08-27 ENCOUNTER — Ambulatory Visit (INDEPENDENT_AMBULATORY_CARE_PROVIDER_SITE_OTHER)

## 2024-08-27 ENCOUNTER — Other Ambulatory Visit: Payer: Self-pay

## 2024-08-27 DIAGNOSIS — J4521 Mild intermittent asthma with (acute) exacerbation: Secondary | ICD-10-CM

## 2024-08-27 DIAGNOSIS — S46911A Strain of unspecified muscle, fascia and tendon at shoulder and upper arm level, right arm, initial encounter: Secondary | ICD-10-CM

## 2024-08-27 DIAGNOSIS — R0602 Shortness of breath: Secondary | ICD-10-CM

## 2024-08-27 DIAGNOSIS — R0789 Other chest pain: Secondary | ICD-10-CM | POA: Diagnosis not present

## 2024-08-27 DIAGNOSIS — R051 Acute cough: Secondary | ICD-10-CM

## 2024-08-27 DIAGNOSIS — M5412 Radiculopathy, cervical region: Secondary | ICD-10-CM

## 2024-08-27 DIAGNOSIS — I10 Essential (primary) hypertension: Secondary | ICD-10-CM

## 2024-08-27 MED ORDER — IPRATROPIUM-ALBUTEROL 0.5-2.5 (3) MG/3ML IN SOLN
RESPIRATORY_TRACT | Status: AC
  Filled 2024-08-27: qty 3

## 2024-08-27 MED ORDER — AMLODIPINE BESYLATE 10 MG PO TABS
10.0000 mg | ORAL_TABLET | Freq: Every day | ORAL | 2 refills | Status: AC
  Filled 2024-08-27: qty 30, 30d supply, fill #0

## 2024-08-27 MED ORDER — PREDNISONE 20 MG PO TABS
40.0000 mg | ORAL_TABLET | Freq: Every day | ORAL | 0 refills | Status: AC
  Filled 2024-08-27: qty 10, 5d supply, fill #0

## 2024-08-27 MED ORDER — IPRATROPIUM-ALBUTEROL 0.5-2.5 (3) MG/3ML IN SOLN
3.0000 mL | Freq: Once | RESPIRATORY_TRACT | Status: AC
  Administered 2024-08-27: 3 mL via RESPIRATORY_TRACT

## 2024-08-27 MED ORDER — ALBUTEROL SULFATE HFA 108 (90 BASE) MCG/ACT IN AERS
1.0000 | INHALATION_SPRAY | Freq: Four times a day (QID) | RESPIRATORY_TRACT | 0 refills | Status: AC | PRN
  Filled 2024-08-27: qty 6.7, 25d supply, fill #0

## 2024-08-27 MED ORDER — METHYLPREDNISOLONE SODIUM SUCC 125 MG IJ SOLR
80.0000 mg | Freq: Once | INTRAMUSCULAR | Status: AC
  Administered 2024-08-27: 80 mg via INTRAMUSCULAR

## 2024-08-27 MED ORDER — METHYLPREDNISOLONE SODIUM SUCC 125 MG IJ SOLR
INTRAMUSCULAR | Status: AC
  Filled 2024-08-27: qty 2

## 2024-08-27 MED ORDER — AMLODIPINE BESYLATE 10 MG PO TABS
10.0000 mg | ORAL_TABLET | Freq: Every day | ORAL | 0 refills | Status: DC
  Filled 2024-08-27: qty 30, 30d supply, fill #0

## 2024-08-27 MED ORDER — BACLOFEN 10 MG PO TABS
10.0000 mg | ORAL_TABLET | Freq: Three times a day (TID) | ORAL | 0 refills | Status: AC
  Filled 2024-08-27: qty 30, 10d supply, fill #0

## 2024-08-27 MED ORDER — ACETAMINOPHEN 325 MG PO TABS
ORAL_TABLET | ORAL | Status: AC
  Filled 2024-08-27: qty 3

## 2024-08-27 MED ORDER — ACETAMINOPHEN 325 MG PO TABS
975.0000 mg | ORAL_TABLET | Freq: Once | ORAL | Status: AC
  Administered 2024-08-27: 975 mg via ORAL

## 2024-08-27 MED ORDER — OMEPRAZOLE 20 MG PO CPDR
20.0000 mg | DELAYED_RELEASE_CAPSULE | Freq: Every day | ORAL | 0 refills | Status: AC
  Filled 2024-08-27: qty 5, 5d supply, fill #0

## 2024-08-27 NOTE — ED Notes (Signed)
Pt st's she feels better after breathing tx

## 2024-08-27 NOTE — Discharge Instructions (Addendum)
 We treated you for muscle strain of the right shoulder and asthma exacerbation.  Use albuterol  inhaler 2 puffs every 4-6 hours as needed for shortness of breath and wheezing.  We gave you an injection of steroid today to help with wheezing to the chest and pain of the right arm/right shoulder and chest.  I would like for you to start taking prednisone  pills starting tomorrow. Take 2 pills of prednisone  steroid once daily for the next 5 days with breakfast.  Take omeprazole  once daily before you take the prednisone  to protect your stomach.  Take amlodipine  10 mg once daily ongoing for management of blood pressure.  Schedule an appointment with [facility] to establish care with a PCP.  Hang in there!   If you develop any new or worsening symptoms or if your symptoms do not start to improve, please return here or follow-up with your primary care provider. If your symptoms are severe, please go to the emergency room.

## 2024-08-27 NOTE — ED Provider Notes (Signed)
 [facility]    CSN: [id number] Arrival date & time: 08/27/24  1528      History   Chief Complaint Chief Complaint  Patient presents with  . Chest Pain    HPI Tracy Coleman is a 48 y.o. female.   Tracy Coleman is a 48 y.o. female presenting for chief complaint of pain to the right upper chest, right shoulder, right neck, and radicular pain to the right arm that started yesterday.  She injured the area of pain to the anterior right chest wall and right shoulder several months ago and has had intermittent pain ever since. Pain is triggered by movement of the head at the neck and with movement of the right arm at the shoulder joint. Pain to the chest, shoulder, and neck is currently a 7/10. Denies rash to overlying skin, recent falls/injuries, dizziness, ear pain, fever/chills, unilateral extremity weakness, and new paresthesias to the extremities. Denies midline neck pain.  Taking aspirin (last dose 2 hours ago) and gas-x with minimal relief of pain.   Also reports intermittent shortness of breath starting yesterday. She has a history of asthma and has used her albuterol  inhaler 2 times for shortness of breath in the last 24 hours with relief. She has a chronic cough and attributes this to her asthma. States she might be coughing a little more than normal but not much.  No fever, chills, body aches, runny nose, sore throat, palpitations, nausea/vomiting, or recent antibiotic/steroid use. Former smoker.   BP is elevated at 178/119 today. She has not taken her olmesartan -amlodipine  in the last 6 months due to problems with cost of medication and lapse in insurance coverage. States BP is usually even higher at home and sometimes reaches the 200/110s range. She attempts to eat low salt diet and exercise to manage BP.  She last saw her PCP 6 months ago before her insurance ran out.    Chest Pain   Past Medical History:  Diagnosis Date  . Anxiety   . Arthritis    feet oa  .  Asthma   . Atypical chest pain 02/06/2019  . Closed fracture of navicular bone of foot 02/07/2019  . COVID 07/2020   loss of smell headache body aches x 7 days all symptoms resolved  . Depression   . Dyspnea    with exertion  . GERD (gastroesophageal reflux disease)   . Hypertension   . Obesity   . PTSD (post-traumatic stress disorder)   . Wears glasses     Patient Active Problem List   Diagnosis Date Noted  . Asthma exacerbation 09/28/2023  . Alcohol use disorder 09/28/2023  . Prediabetes 04/26/2022  . History of cervical dysplasia 01/13/2021  . Tobacco dependence with current use 11/02/2020  . Allergies 05/13/2019  . 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  . Asthma 02/06/2019  . Cervical stenosis (uterine cervix) 01/29/2019  . MDD (major depressive disorder), severe (HCC) 01/03/2019  . Acquired hallux rigidus 10/18/2018  . Arthritis of joint of toe 03/06/2018  . Essential hypertension 05/03/2013  . Morbid obesity (HCC) 05/03/2013  . Anxiety 05/03/2013    Past Surgical History:  Procedure Laterality Date  . ARTHRODESIS METATARSALPHALANGEAL JOINT (MTPJ) Right 04/11/2018   Procedure: ARTHRODESIS METATARSALPHALANGEAL JOINT (MTPJ);  Surgeon: Kit Rush, MD;  Location: [facility];  Service: Orthopedics;  Laterality: Right;  . FOOT ARTHRODESIS Right 01/23/2019   Procedure: Open treatment of right navicular nonunion;  Surgeon: Kit Rush, MD;  Location: [facility];  Service: Orthopedics;  Laterality: Right;  . HYSTEROSCOPY WITH D & C N/A 02/16/2021   Procedure: HYSTEROSCOPY AND IUD REMOVAL;  Surgeon: Cleotilde Ronal RAMAN, MD;  Location: [facility];  Service: Gynecology;  Laterality: N/A;  . LEEP     x2. Last one 2013  . OPERATIVE ULTRASOUND N/A 02/16/2021   Procedure: OPERATIVE ULTRASOUND;  Surgeon: Cleotilde Ronal RAMAN, MD;  Location: [facility];  Service: Gynecology;  Laterality: N/A;    OB History     Gravida  2   Para   1   Term  1   Preterm      AB  1   Living  1      SAB  1   IAB      Ectopic      Multiple      Live Births  1            Home Medications    Prior to Admission medications   Medication Sig Start Date End Date Taking? Authorizing Provider  albuterol  (VENTOLIN  HFA) 108 (90 Base) MCG/ACT inhaler Inhale 1-2 puffs into the lungs every 6 (six) hours as needed for wheezing or shortness of breath. 08/27/24  Yes Enedelia Dorna HERO, FNP  baclofen  (LIORESAL ) 10 MG tablet Take 1 tablet (10 mg total) by mouth 3 (three) times daily. 08/27/24  Yes Enedelia Dorna HERO, FNP  omeprazole  (PRILOSEC) 20 MG capsule Take 1 capsule (20 mg total) by mouth daily. 08/27/24  Yes Enedelia Dorna HERO, FNP  predniSONE  (DELTASONE ) 20 MG tablet Take 2 tablets (40 mg total) by mouth daily with breakfast for 5 days. 08/27/24 09/01/24 Yes StanhopeDorna HERO, FNP  amLODipine  (NORVASC ) 10 MG tablet Take 1 tablet (10 mg total) by mouth daily. 08/27/24   Enedelia Dorna HERO, FNP  budesonide -formoterol  (SYMBICORT ) 80-4.5 MCG/ACT inhaler Inhale 2 puffs into the lungs 2 (two) times daily. Inhale 1 to 2 puffs as needed.  Maximum 12 puffs/day. 12/31/23   Romelle Booty, MD  busPIRone  (BUSPAR ) 15 MG tablet Take 15 mg by mouth 3 (three) times daily. 04/15/23   [provider]  cyanocobalamin (VITAMIN B12) 1000 MCG tablet Take 1,000 mcg by mouth daily.    [provider]  desvenlafaxine (PRISTIQ) 100 MG 24 hr tablet Take 100 mg by mouth daily.    [provider]  Dextromethorphan-buPROPion  ER (AUVELITY) 45-105 MG TBCR Take 1 tablet by mouth in the morning and at bedtime.    [provider]  eplerenone  (INSPRA ) 25 MG tablet Take 1 tablet (25 mg total) by mouth at bedtime. 12/31/23   Romelle Booty, MD  hydrOXYzine  (VISTARIL ) 50 MG capsule Take 50 mg by mouth at bedtime.    [provider]  ketoconazole  (NIZORAL ) 2 % cream Apply 1 Application topically daily. 12/31/23    Romelle Booty, MD  Multiple Vitamin (MULTIVITAMIN) tablet Take 1 tablet by mouth daily.    [provider]  naltrexone  (DEPADE) 50 MG tablet Take 50 mg by mouth daily. 08/25/23   [provider]  traZODone (DESYREL) 100 MG tablet Take 200 mg by mouth at bedtime as needed for sleep.    [provider]  VITAMIN D PO Take by mouth. Patient not taking: Reported on 12/31/2023    [provider]  zolpidem (AMBIEN CR) 6.25 MG CR tablet Take 6.25 mg by mouth at bedtime as needed for sleep.    [provider]    Family History Family History  Problem Relation Age  of Onset  . Alcohol abuse Mother   . Drug abuse Mother   . Depression Mother   . Early death Mother        pneumonia, age 66  . Hypertension Mother   . Colon polyps Father   . Alcohol abuse Father   . Drug abuse Father   . Diabetes Maternal Grandmother   . Hypertension Maternal Grandmother   . Diabetes Paternal Grandmother   . Hypertension Paternal Grandmother   . Colon cancer Neg Hx   . Esophageal cancer Neg Hx   . Rectal cancer Neg Hx   . Stomach cancer Neg Hx     Social History Social History   Tobacco Use  . Smoking status: Former    Current packs/day: 0.00    Average packs/day: 0.5 packs/day for 30.0 years (15.0 ttl pk-yrs)    Types: Cigarettes    Quit date: 09/24/2023    Years since quitting: 0.9  . Smokeless tobacco: Never  Vaping Use  . Vaping status: Never Used  Substance Use Topics  . Alcohol use: Yes    Alcohol/week: 5.0 standard drinks of alcohol    Types: 5 Standard drinks or equivalent per week  . Drug use: Not Currently    Frequency: 7.0 times per week    Types: Marijuana     Allergies   Varenicline   Review of Systems Review of Systems  Cardiovascular:  Positive for chest pain.  Per HPI   Physical Exam Triage Vital Signs ED Triage Vitals  Encounter Vitals Group     BP 08/27/24 1540 (!) 178/119     Girls Systolic BP Percentile --      Girls  Diastolic BP Percentile --      Boys Systolic BP Percentile --      Boys Diastolic BP Percentile --      Pulse Rate 08/27/24 1540 87     Resp 08/27/24 1540 (!) 24     Temp --      Temp Source 08/27/24 1540 Oral     SpO2 08/27/24 1540 100 %     Weight --      Height --      Head Circumference --      Peak Flow --      Pain Score 08/27/24 1535 9     Pain Loc --      Pain Education --      Exclude from Growth Chart --    No data found.  Updated Vital Signs BP (!) 178/119 (BP Location: Right Arm)   Pulse 87   Resp (!) 24   SpO2 100%   Visual Acuity Right Eye Distance:   Left Eye Distance:   Bilateral Distance:    Right Eye Near:   Left Eye Near:    Bilateral Near:     Physical Exam Vitals and nursing note reviewed.  Constitutional:      Appearance: She is not ill-appearing or toxic-appearing.  HENT:     Head: Normocephalic and atraumatic.     Right Ear: Hearing and external ear normal.     Left Ear: Hearing and external ear normal.     Nose: Nose normal.     Mouth/Throat:     Lips: Pink.  Eyes:     General: Lids are normal. Vision grossly intact. Gaze aligned appropriately.     Extraocular Movements: Extraocular movements intact.     Conjunctiva/sclera: Conjunctivae normal.  Cardiovascular:     Rate and Rhythm: Normal  rate and regular rhythm.     Heart sounds: Normal heart sounds, S1 normal and S2 normal.  Pulmonary:     Effort: Pulmonary effort is normal. No respiratory distress.     Breath sounds: Normal air entry. Wheezing (expiratory wheeze heard to left upper lung field on initial assessment, speaks in full sentences without difficulty) present. No decreased breath sounds, rhonchi or rales.  Chest:     Chest wall: Tenderness present.    Musculoskeletal:     Right shoulder: Tenderness present. No swelling, deformity, effusion, laceration or bony tenderness. Normal range of motion. Normal strength. Normal pulse.     Left shoulder: Normal.     Right upper  arm: Tenderness present.     Cervical back: Normal range of motion and neck supple. Spasms present. No swelling, edema, deformity, erythema, signs of trauma, lacerations, rigidity, torticollis, bony tenderness or crepitus. Pain with movement and muscular tenderness (Tender to multiple points of palpation over the right trapezius musculature as well as the right anterior upper chest wall and the right shoulder joint) present. No spinous process tenderness. Normal range of motion.  Lymphadenopathy:     Cervical: No cervical adenopathy.  Skin:    General: Skin is warm and dry.     Capillary Refill: Capillary refill takes less than 2 seconds.     Findings: No rash.  Neurological:     General: No focal deficit present.     Mental Status: She is alert and oriented to person, place, and time. Mental status is at baseline.     Cranial Nerves: No dysarthria or facial asymmetry.  Psychiatric:        Mood and Affect: Mood normal.        Speech: Speech normal.        Behavior: Behavior normal.        Thought Content: Thought content normal.        Judgment: Judgment normal.      UC Treatments / Results  Labs (all labs ordered are listed, but only abnormal results are displayed) Labs Reviewed - No data to display  EKG   Radiology DG Chest 2 View Result Date: 08/27/2024 CLINICAL DATA:  Right upper chest pain, cough EXAM: CHEST - 2 VIEW COMPARISON:  Chest x-ray 12/05/2023, CT chest 02/04/2019 FINDINGS: Prominent cardiac silhouette. Otherwise the heart and mediastinal contours are within normal limits. No focal consolidation. No pulmonary edema. No pleural effusion. No pneumothorax. No acute osseous abnormality. IMPRESSION: No active cardiopulmonary disease. Electronically Signed   By: Morgane  Naveau M.D.   On: 08/27/2024 17:55    Procedures Procedures (including critical care time)  Medications Ordered in UC Medications  methylPREDNISolone  sodium succinate (SOLU-MEDROL ) 125 mg/2 mL  injection 80 mg (80 mg Intramuscular Given 08/27/24 1710)  ipratropium-albuterol  (DUONEB) 0.5-2.5 (3) MG/3ML nebulizer solution 3 mL (3 mLs Nebulization Given 08/27/24 1712)  acetaminophen  (TYLENOL ) tablet 975 mg (975 mg Oral Given 08/27/24 1708)    Initial Impression / Assessment and Plan / UC Course  I have reviewed the triage vital signs and the nursing notes.  Pertinent labs & imaging results that were available during my care of the patient were reviewed by me and considered in my medical decision making (see chart for details).   1. Mild intermittent asthma with acute exacerbation, shortness of breath, acute cough Presentation consistent with acute asthma exacerbation.  Duoneb nebulizer treatment given with significant symptomatic improvement reported by patient and lung sounds on reassessment.   Non-focal lung assessment after  nebulizer treatment and low suspicion for acute pneumonia/focal finding, therefore deferred imaging of the chest.   80mg  solumedrol IM steroid given today, Prednisone  burst to be started tomorrow.  Albuterol  inhaler PRN.   2. Cervical radiculopathy, strain of right shoulder, chest wall pain Right shoulder, chest wall, and right cervical paraspinous pain secondary to inflammation/muscle spasm.  Prednisone  ordered to treat asthma exacerbation will additionally help with cervical radicular pain.  Baclofen  muscle relaxer PRN (drowsiness precautions discussed).  Heat and gentle ROM exercises encouraged. Chest x-ray unremarkable for MSK abnormality.  EKG shows NSR with vent rate of 79 BPM without ST/T wave changes. Similar to previous EKG on file.   3. Essential hypertension BP elevated in clinic today after stopping medication due to loss of insurance and financial barrier. No red flag signs/symptoms indicating need for referral to ED due to elevated BP.  Discussed lifestyle and dietary changes to lower BP further.  Amlodipine  10mg  daily prescribed. Good Rx coupon  provided.  We discussed establishing with a new PCP for ongoing management of HTN.   BP Readings from Last 3 Encounters:  08/27/24 (!) 178/119  12/31/23 (!) 148/114  12/17/23 128/86    Counseled patient on potential for adverse effects with medications prescribed/recommended today, strict ER and return-to-clinic precautions discussed, patient verbalized understanding.    Final Clinical Impressions(s) / UC Diagnoses   Final diagnoses:  Acute cough  Mild intermittent asthma with acute exacerbation  Shortness of breath  Cervical radiculopathy  Strain of right shoulder, initial encounter  Chest wall pain     Discharge Instructions      We treated you for muscle strain of the right shoulder and asthma exacerbation.  Use albuterol  inhaler 2 puffs every 4-6 hours as needed for shortness of breath and wheezing.  We gave you an injection of steroid today to help with wheezing to the chest and pain of the right arm/right shoulder and chest.  I would like for you to start taking prednisone  pills starting tomorrow. Take 2 pills of prednisone  steroid once daily for the next 5 days with breakfast.  Take omeprazole  once daily before you take the prednisone  to protect your stomach.  Take amlodipine  10 mg once daily ongoing for management of blood pressure.  Schedule an appointment with [facility] to establish care with a PCP.  Hang in there!   If you develop any new or worsening symptoms or if your symptoms do not start to improve, please return here or follow-up with your primary care provider. If your symptoms are severe, please go to the emergency room.      ED Prescriptions     Medication Sig Dispense Auth. Provider   amLODipine  (NORVASC ) 10 MG tablet  (Status: Discontinued) Take 1 tablet (10 mg total) by mouth daily. 30 tablet Enedelia Dorna HERO, FNP   baclofen  (LIORESAL ) 10 MG tablet Take 1 tablet (10 mg total) by mouth 3 (three) times daily. 30  each Enedelia Dorna HERO, FNP   predniSONE  (DELTASONE ) 20 MG tablet Take 2 tablets (40 mg total) by mouth daily with breakfast for 5 days. 10 tablet Enedelia Dorna HERO, FNP   albuterol  (VENTOLIN  HFA) 108 (90 Base) MCG/ACT inhaler Inhale 1-2 puffs into the lungs every 6 (six) hours as needed for wheezing or shortness of breath. 6.7 g Enedelia Dorna M, FNP   omeprazole  (PRILOSEC) 20 MG capsule Take 1 capsule (20 mg total) by mouth daily. 5 capsule Naysa Puskas M, FNP   amLODipine  (NORVASC ) 10  MG tablet Take 1 tablet (10 mg total) by mouth daily. 30 tablet Enedelia Dorna HERO, FNP      PDMP not reviewed this encounter.   Enedelia Dorna HERO, [place] 08/31/24 1054
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
## 2024-08-27 NOTE — ED Triage Notes (Signed)
 Reports having chest pain yesterday and today.  .  Felt a cramp in right side of neck, chest and right arm.  Patient reports left fingers ar numb.  Patient took gas-x at 1:30 today with no relief.  Took aspirin 81 mg x 4 ( exp date reported as 2022  Patient feels dizzy.    Patient has not taken blood pressure medicine in 6 months

## 2024-08-28 ENCOUNTER — Other Ambulatory Visit (HOSPITAL_COMMUNITY): Payer: Self-pay

## 2024-09-08 ENCOUNTER — Other Ambulatory Visit (HOSPITAL_COMMUNITY): Payer: Self-pay

## 2024-09-09 ENCOUNTER — Other Ambulatory Visit (HOSPITAL_COMMUNITY): Payer: Self-pay
